# Patient Record
Sex: Female | Born: 1945 | Race: White | Hispanic: No | Marital: Married | Smoking: Former smoker
Health system: Southern US, Community
[De-identification: ages and names within clinical notes are randomized; demographics above are authoritative.]

## PROBLEM LIST (undated history)

## (undated) DIAGNOSIS — F329 Major depressive disorder, single episode, unspecified: Secondary | ICD-10-CM

## (undated) DIAGNOSIS — G20 Parkinson's disease: Secondary | ICD-10-CM

## (undated) DIAGNOSIS — G8929 Other chronic pain: Secondary | ICD-10-CM

## (undated) DIAGNOSIS — I2510 Atherosclerotic heart disease of native coronary artery without angina pectoris: Secondary | ICD-10-CM

## (undated) DIAGNOSIS — I639 Cerebral infarction, unspecified: Secondary | ICD-10-CM

## (undated) DIAGNOSIS — F32A Depression, unspecified: Secondary | ICD-10-CM

## (undated) DIAGNOSIS — G20A1 Parkinson's disease without dyskinesia, without mention of fluctuations: Secondary | ICD-10-CM

---
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
## 2011-03-19 DIAGNOSIS — J209 Acute bronchitis, unspecified: Secondary | ICD-10-CM | POA: Diagnosis not present

## 2011-05-16 DIAGNOSIS — E119 Type 2 diabetes mellitus without complications: Secondary | ICD-10-CM | POA: Diagnosis not present

## 2011-05-16 DIAGNOSIS — I252 Old myocardial infarction: Secondary | ICD-10-CM | POA: Diagnosis not present

## 2011-05-16 DIAGNOSIS — R002 Palpitations: Secondary | ICD-10-CM | POA: Diagnosis not present

## 2011-05-16 DIAGNOSIS — I10 Essential (primary) hypertension: Secondary | ICD-10-CM | POA: Diagnosis not present

## 2011-05-16 DIAGNOSIS — I2510 Atherosclerotic heart disease of native coronary artery without angina pectoris: Secondary | ICD-10-CM | POA: Diagnosis not present

## 2011-05-16 DIAGNOSIS — I209 Angina pectoris, unspecified: Secondary | ICD-10-CM | POA: Diagnosis not present

## 2011-05-23 DIAGNOSIS — I10 Essential (primary) hypertension: Secondary | ICD-10-CM | POA: Diagnosis not present

## 2011-09-02 DIAGNOSIS — H04129 Dry eye syndrome of unspecified lacrimal gland: Secondary | ICD-10-CM | POA: Diagnosis not present

## 2011-09-02 DIAGNOSIS — H1820 Unspecified corneal edema: Secondary | ICD-10-CM | POA: Diagnosis not present

## 2011-09-02 DIAGNOSIS — H26499 Other secondary cataract, unspecified eye: Secondary | ICD-10-CM | POA: Diagnosis not present

## 2011-09-26 DIAGNOSIS — I10 Essential (primary) hypertension: Secondary | ICD-10-CM | POA: Diagnosis not present

## 2011-09-26 DIAGNOSIS — E7800 Pure hypercholesterolemia, unspecified: Secondary | ICD-10-CM | POA: Diagnosis not present

## 2011-09-26 DIAGNOSIS — I259 Chronic ischemic heart disease, unspecified: Secondary | ICD-10-CM | POA: Diagnosis not present

## 2011-10-17 DIAGNOSIS — H04129 Dry eye syndrome of unspecified lacrimal gland: Secondary | ICD-10-CM | POA: Diagnosis not present

## 2011-11-22 DIAGNOSIS — I10 Essential (primary) hypertension: Secondary | ICD-10-CM | POA: Diagnosis not present

## 2011-12-19 DIAGNOSIS — E119 Type 2 diabetes mellitus without complications: Secondary | ICD-10-CM | POA: Diagnosis not present

## 2011-12-19 DIAGNOSIS — E785 Hyperlipidemia, unspecified: Secondary | ICD-10-CM | POA: Diagnosis not present

## 2011-12-19 DIAGNOSIS — I209 Angina pectoris, unspecified: Secondary | ICD-10-CM | POA: Diagnosis not present

## 2011-12-19 DIAGNOSIS — I10 Essential (primary) hypertension: Secondary | ICD-10-CM | POA: Diagnosis not present

## 2011-12-19 DIAGNOSIS — I2510 Atherosclerotic heart disease of native coronary artery without angina pectoris: Secondary | ICD-10-CM | POA: Diagnosis not present

## 2011-12-19 DIAGNOSIS — I219 Acute myocardial infarction, unspecified: Secondary | ICD-10-CM | POA: Diagnosis not present

## 2012-02-21 DIAGNOSIS — Z79899 Other long term (current) drug therapy: Secondary | ICD-10-CM | POA: Diagnosis not present

## 2012-02-21 DIAGNOSIS — I10 Essential (primary) hypertension: Secondary | ICD-10-CM | POA: Diagnosis not present

## 2012-02-21 DIAGNOSIS — E7800 Pure hypercholesterolemia, unspecified: Secondary | ICD-10-CM | POA: Diagnosis not present

## 2012-06-08 DIAGNOSIS — H26499 Other secondary cataract, unspecified eye: Secondary | ICD-10-CM | POA: Diagnosis not present

## 2012-06-12 DIAGNOSIS — H353190 Nonexudative age-related macular degeneration, unspecified eye, stage unspecified: Secondary | ICD-10-CM | POA: Diagnosis not present

## 2012-06-12 DIAGNOSIS — H04129 Dry eye syndrome of unspecified lacrimal gland: Secondary | ICD-10-CM | POA: Diagnosis not present

## 2012-06-12 DIAGNOSIS — H26499 Other secondary cataract, unspecified eye: Secondary | ICD-10-CM | POA: Diagnosis not present

## 2012-06-24 DIAGNOSIS — R109 Unspecified abdominal pain: Secondary | ICD-10-CM | POA: Diagnosis not present

## 2012-06-24 DIAGNOSIS — B029 Zoster without complications: Secondary | ICD-10-CM | POA: Diagnosis not present

## 2012-06-30 DIAGNOSIS — H26499 Other secondary cataract, unspecified eye: Secondary | ICD-10-CM | POA: Diagnosis not present

## 2012-07-03 DIAGNOSIS — H26499 Other secondary cataract, unspecified eye: Secondary | ICD-10-CM | POA: Diagnosis not present

## 2012-07-14 DIAGNOSIS — H26499 Other secondary cataract, unspecified eye: Secondary | ICD-10-CM | POA: Diagnosis not present

## 2012-08-10 DIAGNOSIS — E119 Type 2 diabetes mellitus without complications: Secondary | ICD-10-CM | POA: Diagnosis not present

## 2012-08-10 DIAGNOSIS — E785 Hyperlipidemia, unspecified: Secondary | ICD-10-CM | POA: Diagnosis not present

## 2012-08-10 DIAGNOSIS — I2510 Atherosclerotic heart disease of native coronary artery without angina pectoris: Secondary | ICD-10-CM | POA: Diagnosis not present

## 2012-08-10 DIAGNOSIS — I209 Angina pectoris, unspecified: Secondary | ICD-10-CM | POA: Diagnosis not present

## 2012-08-10 DIAGNOSIS — I10 Essential (primary) hypertension: Secondary | ICD-10-CM | POA: Diagnosis not present

## 2013-01-30 DIAGNOSIS — B029 Zoster without complications: Secondary | ICD-10-CM | POA: Diagnosis not present

## 2013-02-15 DIAGNOSIS — IMO0001 Reserved for inherently not codable concepts without codable children: Secondary | ICD-10-CM | POA: Diagnosis not present

## 2013-02-15 DIAGNOSIS — E7800 Pure hypercholesterolemia, unspecified: Secondary | ICD-10-CM | POA: Diagnosis not present

## 2013-02-15 DIAGNOSIS — I10 Essential (primary) hypertension: Secondary | ICD-10-CM | POA: Diagnosis not present

## 2013-02-15 DIAGNOSIS — Z79899 Other long term (current) drug therapy: Secondary | ICD-10-CM | POA: Diagnosis not present

## 2013-03-16 DIAGNOSIS — L84 Corns and callosities: Secondary | ICD-10-CM | POA: Diagnosis not present

## 2013-03-16 DIAGNOSIS — E119 Type 2 diabetes mellitus without complications: Secondary | ICD-10-CM | POA: Diagnosis not present

## 2013-03-16 DIAGNOSIS — L600 Ingrowing nail: Secondary | ICD-10-CM | POA: Diagnosis not present

## 2013-03-16 DIAGNOSIS — M21619 Bunion of unspecified foot: Secondary | ICD-10-CM | POA: Diagnosis not present

## 2013-03-28 DIAGNOSIS — R1032 Left lower quadrant pain: Secondary | ICD-10-CM | POA: Diagnosis not present

## 2013-03-28 DIAGNOSIS — R110 Nausea: Secondary | ICD-10-CM | POA: Diagnosis not present

## 2013-03-28 DIAGNOSIS — N201 Calculus of ureter: Secondary | ICD-10-CM | POA: Diagnosis not present

## 2013-03-28 DIAGNOSIS — N200 Calculus of kidney: Secondary | ICD-10-CM | POA: Diagnosis not present

## 2013-07-16 DIAGNOSIS — H18519 Endothelial corneal dystrophy, unspecified eye: Secondary | ICD-10-CM | POA: Diagnosis not present

## 2014-02-24 DIAGNOSIS — E1169 Type 2 diabetes mellitus with other specified complication: Secondary | ICD-10-CM | POA: Diagnosis not present

## 2014-02-24 DIAGNOSIS — E780 Pure hypercholesterolemia: Secondary | ICD-10-CM | POA: Diagnosis not present

## 2014-02-24 DIAGNOSIS — E1165 Type 2 diabetes mellitus with hyperglycemia: Secondary | ICD-10-CM | POA: Diagnosis not present

## 2014-02-24 DIAGNOSIS — Z0000 Encounter for general adult medical examination without abnormal findings: Secondary | ICD-10-CM | POA: Diagnosis not present

## 2014-02-24 DIAGNOSIS — I10 Essential (primary) hypertension: Secondary | ICD-10-CM | POA: Diagnosis not present

## 2014-02-24 DIAGNOSIS — Z79899 Other long term (current) drug therapy: Secondary | ICD-10-CM | POA: Diagnosis not present

## 2014-03-01 DIAGNOSIS — Z1211 Encounter for screening for malignant neoplasm of colon: Secondary | ICD-10-CM | POA: Diagnosis not present

## 2014-07-05 DIAGNOSIS — I259 Chronic ischemic heart disease, unspecified: Secondary | ICD-10-CM | POA: Diagnosis not present

## 2014-07-05 DIAGNOSIS — E1169 Type 2 diabetes mellitus with other specified complication: Secondary | ICD-10-CM | POA: Diagnosis not present

## 2014-07-05 DIAGNOSIS — Z79899 Other long term (current) drug therapy: Secondary | ICD-10-CM | POA: Diagnosis not present

## 2014-07-05 DIAGNOSIS — E780 Pure hypercholesterolemia: Secondary | ICD-10-CM | POA: Diagnosis not present

## 2014-07-05 DIAGNOSIS — E1165 Type 2 diabetes mellitus with hyperglycemia: Secondary | ICD-10-CM | POA: Diagnosis not present

## 2014-08-18 DIAGNOSIS — M6281 Muscle weakness (generalized): Secondary | ICD-10-CM | POA: Diagnosis not present

## 2014-08-18 DIAGNOSIS — I10 Essential (primary) hypertension: Secondary | ICD-10-CM | POA: Diagnosis not present

## 2014-08-18 DIAGNOSIS — I2510 Atherosclerotic heart disease of native coronary artery without angina pectoris: Secondary | ICD-10-CM | POA: Diagnosis not present

## 2014-08-18 DIAGNOSIS — E785 Hyperlipidemia, unspecified: Secondary | ICD-10-CM | POA: Diagnosis not present

## 2014-08-23 DIAGNOSIS — R531 Weakness: Secondary | ICD-10-CM | POA: Diagnosis not present

## 2014-08-23 DIAGNOSIS — I6523 Occlusion and stenosis of bilateral carotid arteries: Secondary | ICD-10-CM | POA: Diagnosis not present

## 2014-09-13 DIAGNOSIS — I10 Essential (primary) hypertension: Secondary | ICD-10-CM | POA: Diagnosis not present

## 2014-09-13 DIAGNOSIS — R251 Tremor, unspecified: Secondary | ICD-10-CM | POA: Diagnosis not present

## 2014-09-16 DIAGNOSIS — R251 Tremor, unspecified: Secondary | ICD-10-CM | POA: Diagnosis not present

## 2014-09-16 DIAGNOSIS — R200 Anesthesia of skin: Secondary | ICD-10-CM | POA: Diagnosis not present

## 2014-09-16 DIAGNOSIS — R29898 Other symptoms and signs involving the musculoskeletal system: Secondary | ICD-10-CM | POA: Diagnosis not present

## 2014-09-23 DIAGNOSIS — E119 Type 2 diabetes mellitus without complications: Secondary | ICD-10-CM | POA: Diagnosis not present

## 2014-09-23 DIAGNOSIS — F419 Anxiety disorder, unspecified: Secondary | ICD-10-CM | POA: Diagnosis not present

## 2014-09-23 DIAGNOSIS — I252 Old myocardial infarction: Secondary | ICD-10-CM | POA: Diagnosis not present

## 2014-09-23 DIAGNOSIS — Z87891 Personal history of nicotine dependence: Secondary | ICD-10-CM | POA: Diagnosis not present

## 2014-09-23 DIAGNOSIS — G459 Transient cerebral ischemic attack, unspecified: Secondary | ICD-10-CM | POA: Diagnosis not present

## 2014-09-23 DIAGNOSIS — R531 Weakness: Secondary | ICD-10-CM | POA: Diagnosis not present

## 2014-09-23 DIAGNOSIS — G629 Polyneuropathy, unspecified: Secondary | ICD-10-CM | POA: Diagnosis not present

## 2014-09-23 DIAGNOSIS — R200 Anesthesia of skin: Secondary | ICD-10-CM | POA: Diagnosis not present

## 2014-10-03 DIAGNOSIS — R251 Tremor, unspecified: Secondary | ICD-10-CM | POA: Diagnosis not present

## 2014-10-03 DIAGNOSIS — Z8673 Personal history of transient ischemic attack (TIA), and cerebral infarction without residual deficits: Secondary | ICD-10-CM | POA: Diagnosis not present

## 2014-10-03 DIAGNOSIS — I672 Cerebral atherosclerosis: Secondary | ICD-10-CM | POA: Diagnosis not present

## 2014-10-03 DIAGNOSIS — I10 Essential (primary) hypertension: Secondary | ICD-10-CM | POA: Diagnosis not present

## 2014-10-18 DIAGNOSIS — R251 Tremor, unspecified: Secondary | ICD-10-CM | POA: Diagnosis not present

## 2014-10-20 DIAGNOSIS — F419 Anxiety disorder, unspecified: Secondary | ICD-10-CM | POA: Diagnosis not present

## 2014-10-20 DIAGNOSIS — I10 Essential (primary) hypertension: Secondary | ICD-10-CM | POA: Diagnosis not present

## 2014-10-24 DIAGNOSIS — I639 Cerebral infarction, unspecified: Secondary | ICD-10-CM | POA: Diagnosis not present

## 2014-10-24 DIAGNOSIS — R251 Tremor, unspecified: Secondary | ICD-10-CM | POA: Diagnosis not present

## 2014-10-28 DIAGNOSIS — R251 Tremor, unspecified: Secondary | ICD-10-CM | POA: Diagnosis not present

## 2014-10-28 DIAGNOSIS — I63329 Cerebral infarction due to thrombosis of unspecified anterior cerebral artery: Secondary | ICD-10-CM | POA: Diagnosis not present

## 2014-10-28 DIAGNOSIS — I63422 Cerebral infarction due to embolism of left anterior cerebral artery: Secondary | ICD-10-CM | POA: Diagnosis not present

## 2014-11-01 DIAGNOSIS — I63422 Cerebral infarction due to embolism of left anterior cerebral artery: Secondary | ICD-10-CM | POA: Diagnosis not present

## 2014-11-01 DIAGNOSIS — I63329 Cerebral infarction due to thrombosis of unspecified anterior cerebral artery: Secondary | ICD-10-CM | POA: Diagnosis not present

## 2014-11-04 DIAGNOSIS — I69954 Hemiplegia and hemiparesis following unspecified cerebrovascular disease affecting left non-dominant side: Secondary | ICD-10-CM | POA: Diagnosis not present

## 2014-11-04 DIAGNOSIS — R278 Other lack of coordination: Secondary | ICD-10-CM | POA: Diagnosis not present

## 2014-11-04 DIAGNOSIS — I63329 Cerebral infarction due to thrombosis of unspecified anterior cerebral artery: Secondary | ICD-10-CM | POA: Diagnosis not present

## 2014-11-04 DIAGNOSIS — M6281 Muscle weakness (generalized): Secondary | ICD-10-CM | POA: Diagnosis not present

## 2014-11-09 DIAGNOSIS — M6281 Muscle weakness (generalized): Secondary | ICD-10-CM | POA: Diagnosis not present

## 2014-11-09 DIAGNOSIS — R278 Other lack of coordination: Secondary | ICD-10-CM | POA: Diagnosis not present

## 2014-11-09 DIAGNOSIS — I69954 Hemiplegia and hemiparesis following unspecified cerebrovascular disease affecting left non-dominant side: Secondary | ICD-10-CM | POA: Diagnosis not present

## 2014-11-09 DIAGNOSIS — I63329 Cerebral infarction due to thrombosis of unspecified anterior cerebral artery: Secondary | ICD-10-CM | POA: Diagnosis not present

## 2014-11-11 DIAGNOSIS — M6281 Muscle weakness (generalized): Secondary | ICD-10-CM | POA: Diagnosis not present

## 2014-11-11 DIAGNOSIS — I69954 Hemiplegia and hemiparesis following unspecified cerebrovascular disease affecting left non-dominant side: Secondary | ICD-10-CM | POA: Diagnosis not present

## 2014-11-11 DIAGNOSIS — R278 Other lack of coordination: Secondary | ICD-10-CM | POA: Diagnosis not present

## 2014-11-11 DIAGNOSIS — I63329 Cerebral infarction due to thrombosis of unspecified anterior cerebral artery: Secondary | ICD-10-CM | POA: Diagnosis not present

## 2014-11-16 DIAGNOSIS — R278 Other lack of coordination: Secondary | ICD-10-CM | POA: Diagnosis not present

## 2014-11-16 DIAGNOSIS — R2689 Other abnormalities of gait and mobility: Secondary | ICD-10-CM | POA: Diagnosis not present

## 2014-11-16 DIAGNOSIS — I69954 Hemiplegia and hemiparesis following unspecified cerebrovascular disease affecting left non-dominant side: Secondary | ICD-10-CM | POA: Diagnosis not present

## 2014-11-16 DIAGNOSIS — R296 Repeated falls: Secondary | ICD-10-CM | POA: Diagnosis not present

## 2014-11-16 DIAGNOSIS — I63329 Cerebral infarction due to thrombosis of unspecified anterior cerebral artery: Secondary | ICD-10-CM | POA: Diagnosis not present

## 2014-11-16 DIAGNOSIS — M6281 Muscle weakness (generalized): Secondary | ICD-10-CM | POA: Diagnosis not present

## 2014-11-17 DIAGNOSIS — F419 Anxiety disorder, unspecified: Secondary | ICD-10-CM | POA: Diagnosis not present

## 2014-11-17 DIAGNOSIS — I10 Essential (primary) hypertension: Secondary | ICD-10-CM | POA: Diagnosis not present

## 2014-11-18 DIAGNOSIS — R296 Repeated falls: Secondary | ICD-10-CM | POA: Diagnosis not present

## 2014-11-18 DIAGNOSIS — M6281 Muscle weakness (generalized): Secondary | ICD-10-CM | POA: Diagnosis not present

## 2014-11-18 DIAGNOSIS — I63329 Cerebral infarction due to thrombosis of unspecified anterior cerebral artery: Secondary | ICD-10-CM | POA: Diagnosis not present

## 2014-11-18 DIAGNOSIS — I69954 Hemiplegia and hemiparesis following unspecified cerebrovascular disease affecting left non-dominant side: Secondary | ICD-10-CM | POA: Diagnosis not present

## 2014-11-18 DIAGNOSIS — R2689 Other abnormalities of gait and mobility: Secondary | ICD-10-CM | POA: Diagnosis not present

## 2014-11-18 DIAGNOSIS — R278 Other lack of coordination: Secondary | ICD-10-CM | POA: Diagnosis not present

## 2014-11-23 DIAGNOSIS — R2689 Other abnormalities of gait and mobility: Secondary | ICD-10-CM | POA: Diagnosis not present

## 2014-11-23 DIAGNOSIS — R278 Other lack of coordination: Secondary | ICD-10-CM | POA: Diagnosis not present

## 2014-11-23 DIAGNOSIS — I63329 Cerebral infarction due to thrombosis of unspecified anterior cerebral artery: Secondary | ICD-10-CM | POA: Diagnosis not present

## 2014-11-23 DIAGNOSIS — R251 Tremor, unspecified: Secondary | ICD-10-CM | POA: Diagnosis not present

## 2014-11-23 DIAGNOSIS — R296 Repeated falls: Secondary | ICD-10-CM | POA: Diagnosis not present

## 2014-11-23 DIAGNOSIS — I69954 Hemiplegia and hemiparesis following unspecified cerebrovascular disease affecting left non-dominant side: Secondary | ICD-10-CM | POA: Diagnosis not present

## 2014-11-23 DIAGNOSIS — I63321 Cerebral infarction due to thrombosis of right anterior cerebral artery: Secondary | ICD-10-CM | POA: Diagnosis not present

## 2014-11-23 DIAGNOSIS — M6281 Muscle weakness (generalized): Secondary | ICD-10-CM | POA: Diagnosis not present

## 2014-11-25 DIAGNOSIS — I69954 Hemiplegia and hemiparesis following unspecified cerebrovascular disease affecting left non-dominant side: Secondary | ICD-10-CM | POA: Diagnosis not present

## 2014-11-25 DIAGNOSIS — M6281 Muscle weakness (generalized): Secondary | ICD-10-CM | POA: Diagnosis not present

## 2014-11-25 DIAGNOSIS — R2689 Other abnormalities of gait and mobility: Secondary | ICD-10-CM | POA: Diagnosis not present

## 2014-11-25 DIAGNOSIS — R296 Repeated falls: Secondary | ICD-10-CM | POA: Diagnosis not present

## 2014-11-25 DIAGNOSIS — I63329 Cerebral infarction due to thrombosis of unspecified anterior cerebral artery: Secondary | ICD-10-CM | POA: Diagnosis not present

## 2014-11-25 DIAGNOSIS — R278 Other lack of coordination: Secondary | ICD-10-CM | POA: Diagnosis not present

## 2014-11-28 DIAGNOSIS — R296 Repeated falls: Secondary | ICD-10-CM | POA: Diagnosis not present

## 2014-11-28 DIAGNOSIS — R278 Other lack of coordination: Secondary | ICD-10-CM | POA: Diagnosis not present

## 2014-11-28 DIAGNOSIS — I69954 Hemiplegia and hemiparesis following unspecified cerebrovascular disease affecting left non-dominant side: Secondary | ICD-10-CM | POA: Diagnosis not present

## 2014-11-28 DIAGNOSIS — M6281 Muscle weakness (generalized): Secondary | ICD-10-CM | POA: Diagnosis not present

## 2014-11-28 DIAGNOSIS — R2689 Other abnormalities of gait and mobility: Secondary | ICD-10-CM | POA: Diagnosis not present

## 2014-11-28 DIAGNOSIS — I63329 Cerebral infarction due to thrombosis of unspecified anterior cerebral artery: Secondary | ICD-10-CM | POA: Diagnosis not present

## 2014-11-30 DIAGNOSIS — R2689 Other abnormalities of gait and mobility: Secondary | ICD-10-CM | POA: Diagnosis not present

## 2014-11-30 DIAGNOSIS — R296 Repeated falls: Secondary | ICD-10-CM | POA: Diagnosis not present

## 2014-11-30 DIAGNOSIS — R278 Other lack of coordination: Secondary | ICD-10-CM | POA: Diagnosis not present

## 2014-11-30 DIAGNOSIS — I69954 Hemiplegia and hemiparesis following unspecified cerebrovascular disease affecting left non-dominant side: Secondary | ICD-10-CM | POA: Diagnosis not present

## 2014-11-30 DIAGNOSIS — M6281 Muscle weakness (generalized): Secondary | ICD-10-CM | POA: Diagnosis not present

## 2014-11-30 DIAGNOSIS — I63329 Cerebral infarction due to thrombosis of unspecified anterior cerebral artery: Secondary | ICD-10-CM | POA: Diagnosis not present

## 2014-12-05 DIAGNOSIS — I63329 Cerebral infarction due to thrombosis of unspecified anterior cerebral artery: Secondary | ICD-10-CM | POA: Diagnosis not present

## 2014-12-05 DIAGNOSIS — I69954 Hemiplegia and hemiparesis following unspecified cerebrovascular disease affecting left non-dominant side: Secondary | ICD-10-CM | POA: Diagnosis not present

## 2014-12-05 DIAGNOSIS — M6281 Muscle weakness (generalized): Secondary | ICD-10-CM | POA: Diagnosis not present

## 2014-12-05 DIAGNOSIS — R296 Repeated falls: Secondary | ICD-10-CM | POA: Diagnosis not present

## 2014-12-05 DIAGNOSIS — R2689 Other abnormalities of gait and mobility: Secondary | ICD-10-CM | POA: Diagnosis not present

## 2014-12-05 DIAGNOSIS — R278 Other lack of coordination: Secondary | ICD-10-CM | POA: Diagnosis not present

## 2014-12-07 DIAGNOSIS — I63329 Cerebral infarction due to thrombosis of unspecified anterior cerebral artery: Secondary | ICD-10-CM | POA: Diagnosis not present

## 2014-12-07 DIAGNOSIS — R2689 Other abnormalities of gait and mobility: Secondary | ICD-10-CM | POA: Diagnosis not present

## 2014-12-07 DIAGNOSIS — I69954 Hemiplegia and hemiparesis following unspecified cerebrovascular disease affecting left non-dominant side: Secondary | ICD-10-CM | POA: Diagnosis not present

## 2014-12-07 DIAGNOSIS — M6281 Muscle weakness (generalized): Secondary | ICD-10-CM | POA: Diagnosis not present

## 2014-12-07 DIAGNOSIS — R278 Other lack of coordination: Secondary | ICD-10-CM | POA: Diagnosis not present

## 2014-12-07 DIAGNOSIS — R296 Repeated falls: Secondary | ICD-10-CM | POA: Diagnosis not present

## 2014-12-12 DIAGNOSIS — R296 Repeated falls: Secondary | ICD-10-CM | POA: Diagnosis not present

## 2014-12-12 DIAGNOSIS — R278 Other lack of coordination: Secondary | ICD-10-CM | POA: Diagnosis not present

## 2014-12-12 DIAGNOSIS — I63329 Cerebral infarction due to thrombosis of unspecified anterior cerebral artery: Secondary | ICD-10-CM | POA: Diagnosis not present

## 2014-12-12 DIAGNOSIS — M6281 Muscle weakness (generalized): Secondary | ICD-10-CM | POA: Diagnosis not present

## 2014-12-12 DIAGNOSIS — I69954 Hemiplegia and hemiparesis following unspecified cerebrovascular disease affecting left non-dominant side: Secondary | ICD-10-CM | POA: Diagnosis not present

## 2014-12-12 DIAGNOSIS — R2689 Other abnormalities of gait and mobility: Secondary | ICD-10-CM | POA: Diagnosis not present

## 2014-12-14 DIAGNOSIS — I63329 Cerebral infarction due to thrombosis of unspecified anterior cerebral artery: Secondary | ICD-10-CM | POA: Diagnosis not present

## 2014-12-14 DIAGNOSIS — M6281 Muscle weakness (generalized): Secondary | ICD-10-CM | POA: Diagnosis not present

## 2014-12-14 DIAGNOSIS — R278 Other lack of coordination: Secondary | ICD-10-CM | POA: Diagnosis not present

## 2014-12-14 DIAGNOSIS — I69954 Hemiplegia and hemiparesis following unspecified cerebrovascular disease affecting left non-dominant side: Secondary | ICD-10-CM | POA: Diagnosis not present

## 2014-12-19 DIAGNOSIS — R278 Other lack of coordination: Secondary | ICD-10-CM | POA: Diagnosis not present

## 2014-12-19 DIAGNOSIS — I69954 Hemiplegia and hemiparesis following unspecified cerebrovascular disease affecting left non-dominant side: Secondary | ICD-10-CM | POA: Diagnosis not present

## 2014-12-19 DIAGNOSIS — I63329 Cerebral infarction due to thrombosis of unspecified anterior cerebral artery: Secondary | ICD-10-CM | POA: Diagnosis not present

## 2014-12-19 DIAGNOSIS — M6281 Muscle weakness (generalized): Secondary | ICD-10-CM | POA: Diagnosis not present

## 2014-12-21 DIAGNOSIS — R278 Other lack of coordination: Secondary | ICD-10-CM | POA: Diagnosis not present

## 2014-12-21 DIAGNOSIS — M6281 Muscle weakness (generalized): Secondary | ICD-10-CM | POA: Diagnosis not present

## 2014-12-21 DIAGNOSIS — I69954 Hemiplegia and hemiparesis following unspecified cerebrovascular disease affecting left non-dominant side: Secondary | ICD-10-CM | POA: Diagnosis not present

## 2014-12-21 DIAGNOSIS — I63329 Cerebral infarction due to thrombosis of unspecified anterior cerebral artery: Secondary | ICD-10-CM | POA: Diagnosis not present

## 2014-12-26 DIAGNOSIS — M6281 Muscle weakness (generalized): Secondary | ICD-10-CM | POA: Diagnosis not present

## 2014-12-26 DIAGNOSIS — I69954 Hemiplegia and hemiparesis following unspecified cerebrovascular disease affecting left non-dominant side: Secondary | ICD-10-CM | POA: Diagnosis not present

## 2014-12-26 DIAGNOSIS — R278 Other lack of coordination: Secondary | ICD-10-CM | POA: Diagnosis not present

## 2014-12-26 DIAGNOSIS — I63329 Cerebral infarction due to thrombosis of unspecified anterior cerebral artery: Secondary | ICD-10-CM | POA: Diagnosis not present

## 2014-12-28 DIAGNOSIS — I63329 Cerebral infarction due to thrombosis of unspecified anterior cerebral artery: Secondary | ICD-10-CM | POA: Diagnosis not present

## 2014-12-28 DIAGNOSIS — R278 Other lack of coordination: Secondary | ICD-10-CM | POA: Diagnosis not present

## 2014-12-28 DIAGNOSIS — M6281 Muscle weakness (generalized): Secondary | ICD-10-CM | POA: Diagnosis not present

## 2014-12-28 DIAGNOSIS — I69954 Hemiplegia and hemiparesis following unspecified cerebrovascular disease affecting left non-dominant side: Secondary | ICD-10-CM | POA: Diagnosis not present

## 2015-01-02 DIAGNOSIS — I63329 Cerebral infarction due to thrombosis of unspecified anterior cerebral artery: Secondary | ICD-10-CM | POA: Diagnosis not present

## 2015-01-02 DIAGNOSIS — R278 Other lack of coordination: Secondary | ICD-10-CM | POA: Diagnosis not present

## 2015-01-02 DIAGNOSIS — I69954 Hemiplegia and hemiparesis following unspecified cerebrovascular disease affecting left non-dominant side: Secondary | ICD-10-CM | POA: Diagnosis not present

## 2015-01-02 DIAGNOSIS — M6281 Muscle weakness (generalized): Secondary | ICD-10-CM | POA: Diagnosis not present

## 2015-01-04 DIAGNOSIS — I69954 Hemiplegia and hemiparesis following unspecified cerebrovascular disease affecting left non-dominant side: Secondary | ICD-10-CM | POA: Diagnosis not present

## 2015-01-04 DIAGNOSIS — I63329 Cerebral infarction due to thrombosis of unspecified anterior cerebral artery: Secondary | ICD-10-CM | POA: Diagnosis not present

## 2015-01-04 DIAGNOSIS — M6281 Muscle weakness (generalized): Secondary | ICD-10-CM | POA: Diagnosis not present

## 2015-01-04 DIAGNOSIS — R278 Other lack of coordination: Secondary | ICD-10-CM | POA: Diagnosis not present

## 2015-01-09 DIAGNOSIS — I69954 Hemiplegia and hemiparesis following unspecified cerebrovascular disease affecting left non-dominant side: Secondary | ICD-10-CM | POA: Diagnosis not present

## 2015-01-09 DIAGNOSIS — M6281 Muscle weakness (generalized): Secondary | ICD-10-CM | POA: Diagnosis not present

## 2015-01-09 DIAGNOSIS — I63329 Cerebral infarction due to thrombosis of unspecified anterior cerebral artery: Secondary | ICD-10-CM | POA: Diagnosis not present

## 2015-01-09 DIAGNOSIS — R278 Other lack of coordination: Secondary | ICD-10-CM | POA: Diagnosis not present

## 2015-01-16 DIAGNOSIS — E1169 Type 2 diabetes mellitus with other specified complication: Secondary | ICD-10-CM | POA: Diagnosis not present

## 2015-01-16 DIAGNOSIS — Z79899 Other long term (current) drug therapy: Secondary | ICD-10-CM | POA: Diagnosis not present

## 2015-01-16 DIAGNOSIS — I10 Essential (primary) hypertension: Secondary | ICD-10-CM | POA: Diagnosis not present

## 2015-01-16 DIAGNOSIS — E785 Hyperlipidemia, unspecified: Secondary | ICD-10-CM | POA: Diagnosis not present

## 2015-01-16 DIAGNOSIS — E1165 Type 2 diabetes mellitus with hyperglycemia: Secondary | ICD-10-CM | POA: Diagnosis not present

## 2015-03-08 DIAGNOSIS — R251 Tremor, unspecified: Secondary | ICD-10-CM | POA: Diagnosis not present

## 2015-03-08 DIAGNOSIS — I63321 Cerebral infarction due to thrombosis of right anterior cerebral artery: Secondary | ICD-10-CM | POA: Diagnosis not present

## 2015-06-07 DIAGNOSIS — R251 Tremor, unspecified: Secondary | ICD-10-CM | POA: Diagnosis not present

## 2015-06-12 DIAGNOSIS — R0602 Shortness of breath: Secondary | ICD-10-CM | POA: Diagnosis not present

## 2015-06-12 DIAGNOSIS — E7800 Pure hypercholesterolemia, unspecified: Secondary | ICD-10-CM | POA: Diagnosis not present

## 2015-06-12 DIAGNOSIS — E785 Hyperlipidemia, unspecified: Secondary | ICD-10-CM | POA: Diagnosis not present

## 2015-06-12 DIAGNOSIS — Z79899 Other long term (current) drug therapy: Secondary | ICD-10-CM | POA: Diagnosis not present

## 2015-06-12 DIAGNOSIS — D519 Vitamin B12 deficiency anemia, unspecified: Secondary | ICD-10-CM | POA: Diagnosis not present

## 2015-06-12 DIAGNOSIS — I10 Essential (primary) hypertension: Secondary | ICD-10-CM | POA: Diagnosis not present

## 2015-06-12 DIAGNOSIS — E1169 Type 2 diabetes mellitus with other specified complication: Secondary | ICD-10-CM | POA: Diagnosis not present

## 2015-06-12 DIAGNOSIS — E559 Vitamin D deficiency, unspecified: Secondary | ICD-10-CM | POA: Diagnosis not present

## 2015-06-12 DIAGNOSIS — I2510 Atherosclerotic heart disease of native coronary artery without angina pectoris: Secondary | ICD-10-CM | POA: Diagnosis not present

## 2015-06-26 DIAGNOSIS — I10 Essential (primary) hypertension: Secondary | ICD-10-CM | POA: Diagnosis not present

## 2015-06-26 DIAGNOSIS — I2510 Atherosclerotic heart disease of native coronary artery without angina pectoris: Secondary | ICD-10-CM | POA: Diagnosis not present

## 2015-06-26 DIAGNOSIS — E785 Hyperlipidemia, unspecified: Secondary | ICD-10-CM | POA: Diagnosis not present

## 2015-07-07 DIAGNOSIS — E7800 Pure hypercholesterolemia, unspecified: Secondary | ICD-10-CM | POA: Diagnosis not present

## 2015-07-07 DIAGNOSIS — G250 Essential tremor: Secondary | ICD-10-CM | POA: Diagnosis not present

## 2015-07-07 DIAGNOSIS — I10 Essential (primary) hypertension: Secondary | ICD-10-CM | POA: Diagnosis not present

## 2015-07-07 DIAGNOSIS — E1169 Type 2 diabetes mellitus with other specified complication: Secondary | ICD-10-CM | POA: Diagnosis not present

## 2015-07-31 DIAGNOSIS — F411 Generalized anxiety disorder: Secondary | ICD-10-CM | POA: Diagnosis not present

## 2015-08-08 DIAGNOSIS — F411 Generalized anxiety disorder: Secondary | ICD-10-CM | POA: Diagnosis not present

## 2015-08-17 DIAGNOSIS — F411 Generalized anxiety disorder: Secondary | ICD-10-CM | POA: Diagnosis not present

## 2015-08-23 DIAGNOSIS — F411 Generalized anxiety disorder: Secondary | ICD-10-CM | POA: Diagnosis not present

## 2015-08-30 DIAGNOSIS — F411 Generalized anxiety disorder: Secondary | ICD-10-CM | POA: Diagnosis not present

## 2015-09-04 DIAGNOSIS — R0602 Shortness of breath: Secondary | ICD-10-CM | POA: Diagnosis not present

## 2015-09-04 DIAGNOSIS — R0600 Dyspnea, unspecified: Secondary | ICD-10-CM | POA: Diagnosis not present

## 2015-09-04 DIAGNOSIS — J40 Bronchitis, not specified as acute or chronic: Secondary | ICD-10-CM | POA: Diagnosis not present

## 2015-09-06 DIAGNOSIS — R05 Cough: Secondary | ICD-10-CM | POA: Diagnosis not present

## 2015-09-06 DIAGNOSIS — R0600 Dyspnea, unspecified: Secondary | ICD-10-CM | POA: Diagnosis not present

## 2015-09-06 DIAGNOSIS — R0602 Shortness of breath: Secondary | ICD-10-CM | POA: Diagnosis not present

## 2015-09-06 DIAGNOSIS — J40 Bronchitis, not specified as acute or chronic: Secondary | ICD-10-CM | POA: Diagnosis not present

## 2015-09-20 DIAGNOSIS — F411 Generalized anxiety disorder: Secondary | ICD-10-CM | POA: Diagnosis not present

## 2015-09-28 DIAGNOSIS — F411 Generalized anxiety disorder: Secondary | ICD-10-CM | POA: Diagnosis not present

## 2015-10-04 DIAGNOSIS — R251 Tremor, unspecified: Secondary | ICD-10-CM | POA: Diagnosis not present

## 2015-10-06 DIAGNOSIS — F411 Generalized anxiety disorder: Secondary | ICD-10-CM | POA: Diagnosis not present

## 2015-10-13 DIAGNOSIS — F411 Generalized anxiety disorder: Secondary | ICD-10-CM | POA: Diagnosis not present

## 2015-10-18 DIAGNOSIS — E1169 Type 2 diabetes mellitus with other specified complication: Secondary | ICD-10-CM | POA: Diagnosis not present

## 2015-10-18 DIAGNOSIS — Z79899 Other long term (current) drug therapy: Secondary | ICD-10-CM | POA: Diagnosis not present

## 2015-10-18 DIAGNOSIS — E559 Vitamin D deficiency, unspecified: Secondary | ICD-10-CM | POA: Diagnosis not present

## 2015-10-18 DIAGNOSIS — E785 Hyperlipidemia, unspecified: Secondary | ICD-10-CM | POA: Diagnosis not present

## 2015-10-18 DIAGNOSIS — I10 Essential (primary) hypertension: Secondary | ICD-10-CM | POA: Diagnosis not present

## 2015-10-19 DIAGNOSIS — M79661 Pain in right lower leg: Secondary | ICD-10-CM | POA: Diagnosis not present

## 2015-10-19 DIAGNOSIS — M7989 Other specified soft tissue disorders: Secondary | ICD-10-CM | POA: Diagnosis not present

## 2015-10-19 DIAGNOSIS — I824Y1 Acute embolism and thrombosis of unspecified deep veins of right proximal lower extremity: Secondary | ICD-10-CM | POA: Diagnosis not present

## 2015-10-20 DIAGNOSIS — F411 Generalized anxiety disorder: Secondary | ICD-10-CM | POA: Diagnosis not present

## 2015-10-26 DIAGNOSIS — F411 Generalized anxiety disorder: Secondary | ICD-10-CM | POA: Diagnosis not present

## 2015-11-01 DIAGNOSIS — F411 Generalized anxiety disorder: Secondary | ICD-10-CM | POA: Diagnosis not present

## 2015-11-06 DIAGNOSIS — R251 Tremor, unspecified: Secondary | ICD-10-CM | POA: Diagnosis not present

## 2015-11-08 DIAGNOSIS — F411 Generalized anxiety disorder: Secondary | ICD-10-CM | POA: Diagnosis not present

## 2015-11-15 DIAGNOSIS — F411 Generalized anxiety disorder: Secondary | ICD-10-CM | POA: Diagnosis not present

## 2015-11-16 DIAGNOSIS — Z23 Encounter for immunization: Secondary | ICD-10-CM | POA: Diagnosis not present

## 2015-11-22 DIAGNOSIS — F411 Generalized anxiety disorder: Secondary | ICD-10-CM | POA: Diagnosis not present

## 2015-11-29 DIAGNOSIS — F411 Generalized anxiety disorder: Secondary | ICD-10-CM | POA: Diagnosis not present

## 2015-12-06 DIAGNOSIS — F411 Generalized anxiety disorder: Secondary | ICD-10-CM | POA: Diagnosis not present

## 2015-12-13 DIAGNOSIS — F411 Generalized anxiety disorder: Secondary | ICD-10-CM | POA: Diagnosis not present

## 2015-12-20 DIAGNOSIS — F411 Generalized anxiety disorder: Secondary | ICD-10-CM | POA: Diagnosis not present

## 2015-12-27 DIAGNOSIS — F411 Generalized anxiety disorder: Secondary | ICD-10-CM | POA: Diagnosis not present

## 2015-12-29 DIAGNOSIS — R251 Tremor, unspecified: Secondary | ICD-10-CM | POA: Diagnosis not present

## 2016-01-03 DIAGNOSIS — F411 Generalized anxiety disorder: Secondary | ICD-10-CM | POA: Diagnosis not present

## 2016-01-10 DIAGNOSIS — F411 Generalized anxiety disorder: Secondary | ICD-10-CM | POA: Diagnosis not present

## 2016-01-11 DIAGNOSIS — F411 Generalized anxiety disorder: Secondary | ICD-10-CM | POA: Diagnosis not present

## 2016-01-17 DIAGNOSIS — F411 Generalized anxiety disorder: Secondary | ICD-10-CM | POA: Diagnosis not present

## 2016-01-20 DIAGNOSIS — I63232 Cerebral infarction due to unspecified occlusion or stenosis of left carotid arteries: Secondary | ICD-10-CM | POA: Diagnosis not present

## 2016-01-20 DIAGNOSIS — I252 Old myocardial infarction: Secondary | ICD-10-CM | POA: Diagnosis not present

## 2016-01-20 DIAGNOSIS — R278 Other lack of coordination: Secondary | ICD-10-CM | POA: Diagnosis not present

## 2016-01-20 DIAGNOSIS — I639 Cerebral infarction, unspecified: Secondary | ICD-10-CM | POA: Diagnosis not present

## 2016-01-20 DIAGNOSIS — Z8673 Personal history of transient ischemic attack (TIA), and cerebral infarction without residual deficits: Secondary | ICD-10-CM | POA: Diagnosis not present

## 2016-01-20 DIAGNOSIS — I69354 Hemiplegia and hemiparesis following cerebral infarction affecting left non-dominant side: Secondary | ICD-10-CM | POA: Diagnosis not present

## 2016-01-20 DIAGNOSIS — R4182 Altered mental status, unspecified: Secondary | ICD-10-CM | POA: Diagnosis not present

## 2016-01-20 DIAGNOSIS — I69022 Dysarthria following nontraumatic subarachnoid hemorrhage: Secondary | ICD-10-CM | POA: Diagnosis not present

## 2016-01-20 DIAGNOSIS — I361 Nonrheumatic tricuspid (valve) insufficiency: Secondary | ICD-10-CM | POA: Diagnosis not present

## 2016-01-20 DIAGNOSIS — Z888 Allergy status to other drugs, medicaments and biological substances status: Secondary | ICD-10-CM | POA: Diagnosis not present

## 2016-01-20 DIAGNOSIS — Z79899 Other long term (current) drug therapy: Secondary | ICD-10-CM | POA: Diagnosis not present

## 2016-01-20 DIAGNOSIS — I6522 Occlusion and stenosis of left carotid artery: Secondary | ICD-10-CM | POA: Diagnosis not present

## 2016-01-20 DIAGNOSIS — R0682 Tachypnea, not elsewhere classified: Secondary | ICD-10-CM | POA: Diagnosis present

## 2016-01-20 DIAGNOSIS — I2541 Coronary artery aneurysm: Secondary | ICD-10-CM | POA: Diagnosis not present

## 2016-01-20 DIAGNOSIS — S72001A Fracture of unspecified part of neck of right femur, initial encounter for closed fracture: Secondary | ICD-10-CM | POA: Diagnosis not present

## 2016-01-20 DIAGNOSIS — R293 Abnormal posture: Secondary | ICD-10-CM | POA: Diagnosis not present

## 2016-01-20 DIAGNOSIS — Z87891 Personal history of nicotine dependence: Secondary | ICD-10-CM | POA: Diagnosis not present

## 2016-01-20 DIAGNOSIS — S72012A Unspecified intracapsular fracture of left femur, initial encounter for closed fracture: Secondary | ICD-10-CM | POA: Diagnosis not present

## 2016-01-20 DIAGNOSIS — M6281 Muscle weakness (generalized): Secondary | ICD-10-CM | POA: Diagnosis not present

## 2016-01-20 DIAGNOSIS — S299XXA Unspecified injury of thorax, initial encounter: Secondary | ICD-10-CM | POA: Diagnosis not present

## 2016-01-20 DIAGNOSIS — R2689 Other abnormalities of gait and mobility: Secondary | ICD-10-CM | POA: Diagnosis not present

## 2016-01-20 DIAGNOSIS — F418 Other specified anxiety disorders: Secondary | ICD-10-CM | POA: Diagnosis present

## 2016-01-20 DIAGNOSIS — T148XXA Other injury of unspecified body region, initial encounter: Secondary | ICD-10-CM | POA: Diagnosis not present

## 2016-01-20 DIAGNOSIS — Z9181 History of falling: Secondary | ICD-10-CM | POA: Diagnosis not present

## 2016-01-20 DIAGNOSIS — R29715 NIHSS score 15: Secondary | ICD-10-CM | POA: Diagnosis present

## 2016-01-20 DIAGNOSIS — G8191 Hemiplegia, unspecified affecting right dominant side: Secondary | ICD-10-CM | POA: Diagnosis present

## 2016-01-20 DIAGNOSIS — S72052A Unspecified fracture of head of left femur, initial encounter for closed fracture: Secondary | ICD-10-CM | POA: Diagnosis not present

## 2016-01-20 DIAGNOSIS — R29810 Facial weakness: Secondary | ICD-10-CM | POA: Diagnosis not present

## 2016-01-20 DIAGNOSIS — S72012D Unspecified intracapsular fracture of left femur, subsequent encounter for closed fracture with routine healing: Secondary | ICD-10-CM | POA: Diagnosis not present

## 2016-01-20 DIAGNOSIS — S0990XA Unspecified injury of head, initial encounter: Secondary | ICD-10-CM | POA: Diagnosis not present

## 2016-01-20 DIAGNOSIS — M25552 Pain in left hip: Secondary | ICD-10-CM | POA: Diagnosis not present

## 2016-01-20 DIAGNOSIS — Z7982 Long term (current) use of aspirin: Secondary | ICD-10-CM | POA: Diagnosis not present

## 2016-01-20 DIAGNOSIS — S72042A Displaced fracture of base of neck of left femur, initial encounter for closed fracture: Secondary | ICD-10-CM | POA: Diagnosis not present

## 2016-01-20 DIAGNOSIS — Z01810 Encounter for preprocedural cardiovascular examination: Secondary | ICD-10-CM | POA: Diagnosis not present

## 2016-01-20 DIAGNOSIS — S72002A Fracture of unspecified part of neck of left femur, initial encounter for closed fracture: Secondary | ICD-10-CM | POA: Diagnosis not present

## 2016-01-20 DIAGNOSIS — G20 Parkinson's disease: Secondary | ICD-10-CM | POA: Diagnosis not present

## 2016-01-20 DIAGNOSIS — R1312 Dysphagia, oropharyngeal phase: Secondary | ICD-10-CM | POA: Diagnosis not present

## 2016-01-20 DIAGNOSIS — R5383 Other fatigue: Secondary | ICD-10-CM | POA: Diagnosis not present

## 2016-01-20 DIAGNOSIS — I2510 Atherosclerotic heart disease of native coronary artery without angina pectoris: Secondary | ICD-10-CM | POA: Diagnosis present

## 2016-01-20 DIAGNOSIS — I6602 Occlusion and stenosis of left middle cerebral artery: Secondary | ICD-10-CM | POA: Diagnosis not present

## 2016-01-23 DIAGNOSIS — I679 Cerebrovascular disease, unspecified: Secondary | ICD-10-CM | POA: Diagnosis not present

## 2016-01-23 DIAGNOSIS — R400 Somnolence: Secondary | ICD-10-CM | POA: Diagnosis not present

## 2016-01-23 DIAGNOSIS — I2541 Coronary artery aneurysm: Secondary | ICD-10-CM | POA: Diagnosis not present

## 2016-01-23 DIAGNOSIS — F321 Major depressive disorder, single episode, moderate: Secondary | ICD-10-CM | POA: Diagnosis not present

## 2016-01-23 DIAGNOSIS — R278 Other lack of coordination: Secondary | ICD-10-CM | POA: Diagnosis not present

## 2016-01-23 DIAGNOSIS — K210 Gastro-esophageal reflux disease with esophagitis: Secondary | ICD-10-CM | POA: Diagnosis not present

## 2016-01-23 DIAGNOSIS — R1312 Dysphagia, oropharyngeal phase: Secondary | ICD-10-CM | POA: Diagnosis not present

## 2016-01-23 DIAGNOSIS — E118 Type 2 diabetes mellitus with unspecified complications: Secondary | ICD-10-CM | POA: Diagnosis not present

## 2016-01-23 DIAGNOSIS — G8110 Spastic hemiplegia affecting unspecified side: Secondary | ICD-10-CM | POA: Diagnosis not present

## 2016-01-23 DIAGNOSIS — S71009A Unspecified open wound, unspecified hip, initial encounter: Secondary | ICD-10-CM | POA: Diagnosis not present

## 2016-01-23 DIAGNOSIS — I63139 Cerebral infarction due to embolism of unspecified carotid artery: Secondary | ICD-10-CM | POA: Diagnosis not present

## 2016-01-23 DIAGNOSIS — R2689 Other abnormalities of gait and mobility: Secondary | ICD-10-CM | POA: Diagnosis not present

## 2016-01-23 DIAGNOSIS — S72012A Unspecified intracapsular fracture of left femur, initial encounter for closed fracture: Secondary | ICD-10-CM | POA: Diagnosis not present

## 2016-01-23 DIAGNOSIS — I63511 Cerebral infarction due to unspecified occlusion or stenosis of right middle cerebral artery: Secondary | ICD-10-CM | POA: Diagnosis not present

## 2016-01-23 DIAGNOSIS — F323 Major depressive disorder, single episode, severe with psychotic features: Secondary | ICD-10-CM | POA: Diagnosis not present

## 2016-01-23 DIAGNOSIS — Z9181 History of falling: Secondary | ICD-10-CM | POA: Diagnosis not present

## 2016-01-23 DIAGNOSIS — S72012D Unspecified intracapsular fracture of left femur, subsequent encounter for closed fracture with routine healing: Secondary | ICD-10-CM | POA: Diagnosis not present

## 2016-01-23 DIAGNOSIS — I498 Other specified cardiac arrhythmias: Secondary | ICD-10-CM | POA: Diagnosis not present

## 2016-01-23 DIAGNOSIS — Z79899 Other long term (current) drug therapy: Secondary | ICD-10-CM | POA: Diagnosis not present

## 2016-01-23 DIAGNOSIS — R5383 Other fatigue: Secondary | ICD-10-CM | POA: Diagnosis not present

## 2016-01-23 DIAGNOSIS — M62838 Other muscle spasm: Secondary | ICD-10-CM | POA: Diagnosis not present

## 2016-01-23 DIAGNOSIS — G4700 Insomnia, unspecified: Secondary | ICD-10-CM | POA: Diagnosis not present

## 2016-01-23 DIAGNOSIS — I69354 Hemiplegia and hemiparesis following cerebral infarction affecting left non-dominant side: Secondary | ICD-10-CM | POA: Diagnosis not present

## 2016-01-23 DIAGNOSIS — E785 Hyperlipidemia, unspecified: Secondary | ICD-10-CM | POA: Diagnosis not present

## 2016-01-23 DIAGNOSIS — I4891 Unspecified atrial fibrillation: Secondary | ICD-10-CM | POA: Diagnosis not present

## 2016-01-23 DIAGNOSIS — E46 Unspecified protein-calorie malnutrition: Secondary | ICD-10-CM | POA: Diagnosis not present

## 2016-01-23 DIAGNOSIS — E784 Other hyperlipidemia: Secondary | ICD-10-CM | POA: Diagnosis not present

## 2016-01-23 DIAGNOSIS — R293 Abnormal posture: Secondary | ICD-10-CM | POA: Diagnosis not present

## 2016-01-23 DIAGNOSIS — G20 Parkinson's disease: Secondary | ICD-10-CM | POA: Diagnosis not present

## 2016-01-23 DIAGNOSIS — M84459A Pathological fracture, hip, unspecified, initial encounter for fracture: Secondary | ICD-10-CM | POA: Diagnosis not present

## 2016-01-23 DIAGNOSIS — E119 Type 2 diabetes mellitus without complications: Secondary | ICD-10-CM | POA: Diagnosis not present

## 2016-01-23 DIAGNOSIS — I69022 Dysarthria following nontraumatic subarachnoid hemorrhage: Secondary | ICD-10-CM | POA: Diagnosis not present

## 2016-01-23 DIAGNOSIS — I10 Essential (primary) hypertension: Secondary | ICD-10-CM | POA: Diagnosis not present

## 2016-01-23 DIAGNOSIS — M6281 Muscle weakness (generalized): Secondary | ICD-10-CM | POA: Diagnosis not present

## 2016-01-23 DIAGNOSIS — I2510 Atherosclerotic heart disease of native coronary artery without angina pectoris: Secondary | ICD-10-CM | POA: Diagnosis not present

## 2016-01-23 DIAGNOSIS — S72002A Fracture of unspecified part of neck of left femur, initial encounter for closed fracture: Secondary | ICD-10-CM | POA: Diagnosis not present

## 2016-01-26 DIAGNOSIS — R400 Somnolence: Secondary | ICD-10-CM | POA: Diagnosis not present

## 2016-01-26 DIAGNOSIS — I10 Essential (primary) hypertension: Secondary | ICD-10-CM | POA: Diagnosis not present

## 2016-01-26 DIAGNOSIS — K210 Gastro-esophageal reflux disease with esophagitis: Secondary | ICD-10-CM | POA: Diagnosis not present

## 2016-01-26 DIAGNOSIS — M84459A Pathological fracture, hip, unspecified, initial encounter for fracture: Secondary | ICD-10-CM | POA: Diagnosis not present

## 2016-01-28 DIAGNOSIS — Z79899 Other long term (current) drug therapy: Secondary | ICD-10-CM | POA: Diagnosis not present

## 2016-01-28 DIAGNOSIS — E119 Type 2 diabetes mellitus without complications: Secondary | ICD-10-CM | POA: Diagnosis not present

## 2016-01-28 DIAGNOSIS — R5383 Other fatigue: Secondary | ICD-10-CM | POA: Diagnosis not present

## 2016-01-28 DIAGNOSIS — E785 Hyperlipidemia, unspecified: Secondary | ICD-10-CM | POA: Diagnosis not present

## 2016-02-07 DIAGNOSIS — G4700 Insomnia, unspecified: Secondary | ICD-10-CM | POA: Diagnosis not present

## 2016-02-07 DIAGNOSIS — M84459A Pathological fracture, hip, unspecified, initial encounter for fracture: Secondary | ICD-10-CM | POA: Diagnosis not present

## 2016-02-13 DIAGNOSIS — E784 Other hyperlipidemia: Secondary | ICD-10-CM | POA: Diagnosis not present

## 2016-02-13 DIAGNOSIS — I63139 Cerebral infarction due to embolism of unspecified carotid artery: Secondary | ICD-10-CM | POA: Diagnosis not present

## 2016-02-13 DIAGNOSIS — I2510 Atherosclerotic heart disease of native coronary artery without angina pectoris: Secondary | ICD-10-CM | POA: Diagnosis not present

## 2016-02-13 DIAGNOSIS — I498 Other specified cardiac arrhythmias: Secondary | ICD-10-CM | POA: Diagnosis not present

## 2016-02-13 DIAGNOSIS — I10 Essential (primary) hypertension: Secondary | ICD-10-CM | POA: Diagnosis not present

## 2016-02-15 DIAGNOSIS — I4891 Unspecified atrial fibrillation: Secondary | ICD-10-CM | POA: Diagnosis not present

## 2016-02-16 DIAGNOSIS — I63511 Cerebral infarction due to unspecified occlusion or stenosis of right middle cerebral artery: Secondary | ICD-10-CM | POA: Diagnosis not present

## 2016-02-16 DIAGNOSIS — M62838 Other muscle spasm: Secondary | ICD-10-CM | POA: Diagnosis not present

## 2016-02-16 DIAGNOSIS — G20 Parkinson's disease: Secondary | ICD-10-CM | POA: Diagnosis not present

## 2016-02-21 DIAGNOSIS — S71009A Unspecified open wound, unspecified hip, initial encounter: Secondary | ICD-10-CM | POA: Diagnosis not present

## 2016-02-27 DIAGNOSIS — S72002A Fracture of unspecified part of neck of left femur, initial encounter for closed fracture: Secondary | ICD-10-CM | POA: Diagnosis not present

## 2016-03-12 DIAGNOSIS — G4700 Insomnia, unspecified: Secondary | ICD-10-CM | POA: Diagnosis not present

## 2016-03-12 DIAGNOSIS — F321 Major depressive disorder, single episode, moderate: Secondary | ICD-10-CM | POA: Diagnosis not present

## 2016-03-15 DIAGNOSIS — I10 Essential (primary) hypertension: Secondary | ICD-10-CM | POA: Diagnosis not present

## 2016-03-15 DIAGNOSIS — I498 Other specified cardiac arrhythmias: Secondary | ICD-10-CM | POA: Diagnosis not present

## 2016-03-15 DIAGNOSIS — I63139 Cerebral infarction due to embolism of unspecified carotid artery: Secondary | ICD-10-CM | POA: Diagnosis not present

## 2016-03-17 DIAGNOSIS — E118 Type 2 diabetes mellitus with unspecified complications: Secondary | ICD-10-CM | POA: Diagnosis not present

## 2016-03-17 DIAGNOSIS — E46 Unspecified protein-calorie malnutrition: Secondary | ICD-10-CM | POA: Diagnosis not present

## 2016-03-17 DIAGNOSIS — G20 Parkinson's disease: Secondary | ICD-10-CM | POA: Diagnosis not present

## 2016-03-17 DIAGNOSIS — G8110 Spastic hemiplegia affecting unspecified side: Secondary | ICD-10-CM | POA: Diagnosis not present

## 2016-03-25 DIAGNOSIS — F323 Major depressive disorder, single episode, severe with psychotic features: Secondary | ICD-10-CM | POA: Diagnosis not present

## 2016-03-25 DIAGNOSIS — G4700 Insomnia, unspecified: Secondary | ICD-10-CM | POA: Diagnosis not present

## 2016-04-09 DIAGNOSIS — G8110 Spastic hemiplegia affecting unspecified side: Secondary | ICD-10-CM | POA: Diagnosis not present

## 2016-04-09 DIAGNOSIS — I69354 Hemiplegia and hemiparesis following cerebral infarction affecting left non-dominant side: Secondary | ICD-10-CM | POA: Diagnosis not present

## 2016-04-09 DIAGNOSIS — I679 Cerebrovascular disease, unspecified: Secondary | ICD-10-CM | POA: Diagnosis not present

## 2016-04-20 DIAGNOSIS — I69354 Hemiplegia and hemiparesis following cerebral infarction affecting left non-dominant side: Secondary | ICD-10-CM | POA: Diagnosis not present

## 2016-04-20 DIAGNOSIS — G20 Parkinson's disease: Secondary | ICD-10-CM | POA: Diagnosis not present

## 2016-04-20 DIAGNOSIS — S71009A Unspecified open wound, unspecified hip, initial encounter: Secondary | ICD-10-CM | POA: Diagnosis not present

## 2016-04-20 DIAGNOSIS — I679 Cerebrovascular disease, unspecified: Secondary | ICD-10-CM | POA: Diagnosis not present

## 2016-05-07 DIAGNOSIS — I69354 Hemiplegia and hemiparesis following cerebral infarction affecting left non-dominant side: Secondary | ICD-10-CM | POA: Diagnosis not present

## 2016-05-07 DIAGNOSIS — L719 Rosacea, unspecified: Secondary | ICD-10-CM | POA: Diagnosis not present

## 2016-05-10 DIAGNOSIS — F321 Major depressive disorder, single episode, moderate: Secondary | ICD-10-CM | POA: Diagnosis not present

## 2016-05-13 DIAGNOSIS — I63139 Cerebral infarction due to embolism of unspecified carotid artery: Secondary | ICD-10-CM | POA: Diagnosis not present

## 2016-05-17 DIAGNOSIS — F321 Major depressive disorder, single episode, moderate: Secondary | ICD-10-CM | POA: Diagnosis not present

## 2016-05-20 DIAGNOSIS — F323 Major depressive disorder, single episode, severe with psychotic features: Secondary | ICD-10-CM | POA: Diagnosis not present

## 2016-05-20 DIAGNOSIS — G4700 Insomnia, unspecified: Secondary | ICD-10-CM | POA: Diagnosis not present

## 2016-05-24 DIAGNOSIS — F321 Major depressive disorder, single episode, moderate: Secondary | ICD-10-CM | POA: Diagnosis not present

## 2016-05-28 DIAGNOSIS — S72002A Fracture of unspecified part of neck of left femur, initial encounter for closed fracture: Secondary | ICD-10-CM | POA: Diagnosis not present

## 2016-05-30 DIAGNOSIS — E119 Type 2 diabetes mellitus without complications: Secondary | ICD-10-CM | POA: Diagnosis not present

## 2016-05-31 DIAGNOSIS — F321 Major depressive disorder, single episode, moderate: Secondary | ICD-10-CM | POA: Diagnosis not present

## 2016-06-12 DIAGNOSIS — E119 Type 2 diabetes mellitus without complications: Secondary | ICD-10-CM | POA: Diagnosis not present

## 2016-06-12 DIAGNOSIS — Z6823 Body mass index (BMI) 23.0-23.9, adult: Secondary | ICD-10-CM | POA: Diagnosis not present

## 2016-06-12 DIAGNOSIS — G20 Parkinson's disease: Secondary | ICD-10-CM | POA: Diagnosis not present

## 2016-06-12 DIAGNOSIS — I69354 Hemiplegia and hemiparesis following cerebral infarction affecting left non-dominant side: Secondary | ICD-10-CM | POA: Diagnosis not present

## 2016-06-14 DIAGNOSIS — F321 Major depressive disorder, single episode, moderate: Secondary | ICD-10-CM | POA: Diagnosis not present

## 2016-06-18 DIAGNOSIS — G4700 Insomnia, unspecified: Secondary | ICD-10-CM | POA: Diagnosis not present

## 2016-06-18 DIAGNOSIS — F323 Major depressive disorder, single episode, severe with psychotic features: Secondary | ICD-10-CM | POA: Diagnosis not present

## 2016-06-21 DIAGNOSIS — F321 Major depressive disorder, single episode, moderate: Secondary | ICD-10-CM | POA: Diagnosis not present

## 2016-06-28 DIAGNOSIS — F321 Major depressive disorder, single episode, moderate: Secondary | ICD-10-CM | POA: Diagnosis not present

## 2016-07-05 DIAGNOSIS — F321 Major depressive disorder, single episode, moderate: Secondary | ICD-10-CM | POA: Diagnosis not present

## 2016-07-11 DIAGNOSIS — R293 Abnormal posture: Secondary | ICD-10-CM | POA: Diagnosis not present

## 2016-07-11 DIAGNOSIS — M6281 Muscle weakness (generalized): Secondary | ICD-10-CM | POA: Diagnosis not present

## 2016-07-11 DIAGNOSIS — I69354 Hemiplegia and hemiparesis following cerebral infarction affecting left non-dominant side: Secondary | ICD-10-CM | POA: Diagnosis not present

## 2016-07-11 DIAGNOSIS — Z9181 History of falling: Secondary | ICD-10-CM | POA: Diagnosis not present

## 2016-07-11 DIAGNOSIS — R278 Other lack of coordination: Secondary | ICD-10-CM | POA: Diagnosis not present

## 2016-07-11 DIAGNOSIS — G20 Parkinson's disease: Secondary | ICD-10-CM | POA: Diagnosis not present

## 2016-07-11 DIAGNOSIS — S72012D Unspecified intracapsular fracture of left femur, subsequent encounter for closed fracture with routine healing: Secondary | ICD-10-CM | POA: Diagnosis not present

## 2016-07-12 DIAGNOSIS — M6281 Muscle weakness (generalized): Secondary | ICD-10-CM | POA: Diagnosis not present

## 2016-07-12 DIAGNOSIS — M25642 Stiffness of left hand, not elsewhere classified: Secondary | ICD-10-CM | POA: Diagnosis not present

## 2016-07-12 DIAGNOSIS — G20 Parkinson's disease: Secondary | ICD-10-CM | POA: Diagnosis not present

## 2016-07-12 DIAGNOSIS — S72012D Unspecified intracapsular fracture of left femur, subsequent encounter for closed fracture with routine healing: Secondary | ICD-10-CM | POA: Diagnosis not present

## 2016-07-12 DIAGNOSIS — I69354 Hemiplegia and hemiparesis following cerebral infarction affecting left non-dominant side: Secondary | ICD-10-CM | POA: Diagnosis not present

## 2016-07-12 DIAGNOSIS — Z9181 History of falling: Secondary | ICD-10-CM | POA: Diagnosis not present

## 2016-07-12 DIAGNOSIS — M25622 Stiffness of left elbow, not elsewhere classified: Secondary | ICD-10-CM | POA: Diagnosis not present

## 2016-07-12 DIAGNOSIS — M2550 Pain in unspecified joint: Secondary | ICD-10-CM | POA: Diagnosis not present

## 2016-07-12 DIAGNOSIS — M25522 Pain in left elbow: Secondary | ICD-10-CM | POA: Diagnosis not present

## 2016-07-12 DIAGNOSIS — R278 Other lack of coordination: Secondary | ICD-10-CM | POA: Diagnosis not present

## 2016-07-12 DIAGNOSIS — R293 Abnormal posture: Secondary | ICD-10-CM | POA: Diagnosis not present

## 2016-07-15 DIAGNOSIS — G20 Parkinson's disease: Secondary | ICD-10-CM | POA: Diagnosis not present

## 2016-07-15 DIAGNOSIS — Z9181 History of falling: Secondary | ICD-10-CM | POA: Diagnosis not present

## 2016-07-15 DIAGNOSIS — S72012D Unspecified intracapsular fracture of left femur, subsequent encounter for closed fracture with routine healing: Secondary | ICD-10-CM | POA: Diagnosis not present

## 2016-07-15 DIAGNOSIS — I69354 Hemiplegia and hemiparesis following cerebral infarction affecting left non-dominant side: Secondary | ICD-10-CM | POA: Diagnosis not present

## 2016-07-15 DIAGNOSIS — M6281 Muscle weakness (generalized): Secondary | ICD-10-CM | POA: Diagnosis not present

## 2016-07-15 DIAGNOSIS — R278 Other lack of coordination: Secondary | ICD-10-CM | POA: Diagnosis not present

## 2016-07-16 DIAGNOSIS — I69354 Hemiplegia and hemiparesis following cerebral infarction affecting left non-dominant side: Secondary | ICD-10-CM | POA: Diagnosis not present

## 2016-07-16 DIAGNOSIS — S72012D Unspecified intracapsular fracture of left femur, subsequent encounter for closed fracture with routine healing: Secondary | ICD-10-CM | POA: Diagnosis not present

## 2016-07-16 DIAGNOSIS — Z9181 History of falling: Secondary | ICD-10-CM | POA: Diagnosis not present

## 2016-07-16 DIAGNOSIS — R278 Other lack of coordination: Secondary | ICD-10-CM | POA: Diagnosis not present

## 2016-07-16 DIAGNOSIS — M6281 Muscle weakness (generalized): Secondary | ICD-10-CM | POA: Diagnosis not present

## 2016-07-16 DIAGNOSIS — G20 Parkinson's disease: Secondary | ICD-10-CM | POA: Diagnosis not present

## 2016-07-17 DIAGNOSIS — E119 Type 2 diabetes mellitus without complications: Secondary | ICD-10-CM | POA: Diagnosis not present

## 2016-07-17 DIAGNOSIS — G8110 Spastic hemiplegia affecting unspecified side: Secondary | ICD-10-CM | POA: Diagnosis not present

## 2016-07-17 DIAGNOSIS — M6281 Muscle weakness (generalized): Secondary | ICD-10-CM | POA: Diagnosis not present

## 2016-07-17 DIAGNOSIS — Z9181 History of falling: Secondary | ICD-10-CM | POA: Diagnosis not present

## 2016-07-17 DIAGNOSIS — I679 Cerebrovascular disease, unspecified: Secondary | ICD-10-CM | POA: Diagnosis not present

## 2016-07-17 DIAGNOSIS — I69354 Hemiplegia and hemiparesis following cerebral infarction affecting left non-dominant side: Secondary | ICD-10-CM | POA: Diagnosis not present

## 2016-07-17 DIAGNOSIS — R278 Other lack of coordination: Secondary | ICD-10-CM | POA: Diagnosis not present

## 2016-07-17 DIAGNOSIS — G20 Parkinson's disease: Secondary | ICD-10-CM | POA: Diagnosis not present

## 2016-07-17 DIAGNOSIS — S72012D Unspecified intracapsular fracture of left femur, subsequent encounter for closed fracture with routine healing: Secondary | ICD-10-CM | POA: Diagnosis not present

## 2016-07-18 DIAGNOSIS — M6281 Muscle weakness (generalized): Secondary | ICD-10-CM | POA: Diagnosis not present

## 2016-07-18 DIAGNOSIS — S72012D Unspecified intracapsular fracture of left femur, subsequent encounter for closed fracture with routine healing: Secondary | ICD-10-CM | POA: Diagnosis not present

## 2016-07-18 DIAGNOSIS — I69354 Hemiplegia and hemiparesis following cerebral infarction affecting left non-dominant side: Secondary | ICD-10-CM | POA: Diagnosis not present

## 2016-07-18 DIAGNOSIS — R278 Other lack of coordination: Secondary | ICD-10-CM | POA: Diagnosis not present

## 2016-07-18 DIAGNOSIS — Z9181 History of falling: Secondary | ICD-10-CM | POA: Diagnosis not present

## 2016-07-18 DIAGNOSIS — G20 Parkinson's disease: Secondary | ICD-10-CM | POA: Diagnosis not present

## 2016-07-19 DIAGNOSIS — S72012D Unspecified intracapsular fracture of left femur, subsequent encounter for closed fracture with routine healing: Secondary | ICD-10-CM | POA: Diagnosis not present

## 2016-07-19 DIAGNOSIS — Z9181 History of falling: Secondary | ICD-10-CM | POA: Diagnosis not present

## 2016-07-19 DIAGNOSIS — R278 Other lack of coordination: Secondary | ICD-10-CM | POA: Diagnosis not present

## 2016-07-19 DIAGNOSIS — I69354 Hemiplegia and hemiparesis following cerebral infarction affecting left non-dominant side: Secondary | ICD-10-CM | POA: Diagnosis not present

## 2016-07-19 DIAGNOSIS — M6281 Muscle weakness (generalized): Secondary | ICD-10-CM | POA: Diagnosis not present

## 2016-07-19 DIAGNOSIS — G20 Parkinson's disease: Secondary | ICD-10-CM | POA: Diagnosis not present

## 2016-07-22 DIAGNOSIS — S72012D Unspecified intracapsular fracture of left femur, subsequent encounter for closed fracture with routine healing: Secondary | ICD-10-CM | POA: Diagnosis not present

## 2016-07-22 DIAGNOSIS — M6281 Muscle weakness (generalized): Secondary | ICD-10-CM | POA: Diagnosis not present

## 2016-07-22 DIAGNOSIS — Z9181 History of falling: Secondary | ICD-10-CM | POA: Diagnosis not present

## 2016-07-22 DIAGNOSIS — I69354 Hemiplegia and hemiparesis following cerebral infarction affecting left non-dominant side: Secondary | ICD-10-CM | POA: Diagnosis not present

## 2016-07-22 DIAGNOSIS — R278 Other lack of coordination: Secondary | ICD-10-CM | POA: Diagnosis not present

## 2016-07-22 DIAGNOSIS — G20 Parkinson's disease: Secondary | ICD-10-CM | POA: Diagnosis not present

## 2016-07-23 DIAGNOSIS — R278 Other lack of coordination: Secondary | ICD-10-CM | POA: Diagnosis not present

## 2016-07-23 DIAGNOSIS — M6281 Muscle weakness (generalized): Secondary | ICD-10-CM | POA: Diagnosis not present

## 2016-07-23 DIAGNOSIS — S72012D Unspecified intracapsular fracture of left femur, subsequent encounter for closed fracture with routine healing: Secondary | ICD-10-CM | POA: Diagnosis not present

## 2016-07-23 DIAGNOSIS — G20 Parkinson's disease: Secondary | ICD-10-CM | POA: Diagnosis not present

## 2016-07-23 DIAGNOSIS — I69354 Hemiplegia and hemiparesis following cerebral infarction affecting left non-dominant side: Secondary | ICD-10-CM | POA: Diagnosis not present

## 2016-07-23 DIAGNOSIS — Z9181 History of falling: Secondary | ICD-10-CM | POA: Diagnosis not present

## 2016-07-24 DIAGNOSIS — G20 Parkinson's disease: Secondary | ICD-10-CM | POA: Diagnosis not present

## 2016-07-24 DIAGNOSIS — Z9181 History of falling: Secondary | ICD-10-CM | POA: Diagnosis not present

## 2016-07-24 DIAGNOSIS — M6281 Muscle weakness (generalized): Secondary | ICD-10-CM | POA: Diagnosis not present

## 2016-07-24 DIAGNOSIS — R278 Other lack of coordination: Secondary | ICD-10-CM | POA: Diagnosis not present

## 2016-07-24 DIAGNOSIS — S72012D Unspecified intracapsular fracture of left femur, subsequent encounter for closed fracture with routine healing: Secondary | ICD-10-CM | POA: Diagnosis not present

## 2016-07-24 DIAGNOSIS — I69354 Hemiplegia and hemiparesis following cerebral infarction affecting left non-dominant side: Secondary | ICD-10-CM | POA: Diagnosis not present

## 2016-07-25 DIAGNOSIS — S72012D Unspecified intracapsular fracture of left femur, subsequent encounter for closed fracture with routine healing: Secondary | ICD-10-CM | POA: Diagnosis not present

## 2016-07-25 DIAGNOSIS — G20 Parkinson's disease: Secondary | ICD-10-CM | POA: Diagnosis not present

## 2016-07-25 DIAGNOSIS — Z9181 History of falling: Secondary | ICD-10-CM | POA: Diagnosis not present

## 2016-07-25 DIAGNOSIS — I69354 Hemiplegia and hemiparesis following cerebral infarction affecting left non-dominant side: Secondary | ICD-10-CM | POA: Diagnosis not present

## 2016-07-25 DIAGNOSIS — M6281 Muscle weakness (generalized): Secondary | ICD-10-CM | POA: Diagnosis not present

## 2016-07-25 DIAGNOSIS — R278 Other lack of coordination: Secondary | ICD-10-CM | POA: Diagnosis not present

## 2016-07-26 DIAGNOSIS — G20 Parkinson's disease: Secondary | ICD-10-CM | POA: Diagnosis not present

## 2016-07-26 DIAGNOSIS — R278 Other lack of coordination: Secondary | ICD-10-CM | POA: Diagnosis not present

## 2016-07-26 DIAGNOSIS — M6281 Muscle weakness (generalized): Secondary | ICD-10-CM | POA: Diagnosis not present

## 2016-07-26 DIAGNOSIS — Z9181 History of falling: Secondary | ICD-10-CM | POA: Diagnosis not present

## 2016-07-26 DIAGNOSIS — I69354 Hemiplegia and hemiparesis following cerebral infarction affecting left non-dominant side: Secondary | ICD-10-CM | POA: Diagnosis not present

## 2016-07-26 DIAGNOSIS — S72012D Unspecified intracapsular fracture of left femur, subsequent encounter for closed fracture with routine healing: Secondary | ICD-10-CM | POA: Diagnosis not present

## 2016-07-29 DIAGNOSIS — G20 Parkinson's disease: Secondary | ICD-10-CM | POA: Diagnosis not present

## 2016-07-29 DIAGNOSIS — R278 Other lack of coordination: Secondary | ICD-10-CM | POA: Diagnosis not present

## 2016-07-29 DIAGNOSIS — Z9181 History of falling: Secondary | ICD-10-CM | POA: Diagnosis not present

## 2016-07-29 DIAGNOSIS — M6281 Muscle weakness (generalized): Secondary | ICD-10-CM | POA: Diagnosis not present

## 2016-07-29 DIAGNOSIS — S72012D Unspecified intracapsular fracture of left femur, subsequent encounter for closed fracture with routine healing: Secondary | ICD-10-CM | POA: Diagnosis not present

## 2016-07-29 DIAGNOSIS — I69354 Hemiplegia and hemiparesis following cerebral infarction affecting left non-dominant side: Secondary | ICD-10-CM | POA: Diagnosis not present

## 2016-07-30 DIAGNOSIS — Z9181 History of falling: Secondary | ICD-10-CM | POA: Diagnosis not present

## 2016-07-30 DIAGNOSIS — M6281 Muscle weakness (generalized): Secondary | ICD-10-CM | POA: Diagnosis not present

## 2016-07-30 DIAGNOSIS — I69354 Hemiplegia and hemiparesis following cerebral infarction affecting left non-dominant side: Secondary | ICD-10-CM | POA: Diagnosis not present

## 2016-07-30 DIAGNOSIS — S72012D Unspecified intracapsular fracture of left femur, subsequent encounter for closed fracture with routine healing: Secondary | ICD-10-CM | POA: Diagnosis not present

## 2016-07-30 DIAGNOSIS — R278 Other lack of coordination: Secondary | ICD-10-CM | POA: Diagnosis not present

## 2016-07-30 DIAGNOSIS — G20 Parkinson's disease: Secondary | ICD-10-CM | POA: Diagnosis not present

## 2016-07-31 DIAGNOSIS — G20 Parkinson's disease: Secondary | ICD-10-CM | POA: Diagnosis not present

## 2016-07-31 DIAGNOSIS — Z9181 History of falling: Secondary | ICD-10-CM | POA: Diagnosis not present

## 2016-07-31 DIAGNOSIS — R278 Other lack of coordination: Secondary | ICD-10-CM | POA: Diagnosis not present

## 2016-07-31 DIAGNOSIS — S72012D Unspecified intracapsular fracture of left femur, subsequent encounter for closed fracture with routine healing: Secondary | ICD-10-CM | POA: Diagnosis not present

## 2016-07-31 DIAGNOSIS — M6281 Muscle weakness (generalized): Secondary | ICD-10-CM | POA: Diagnosis not present

## 2016-07-31 DIAGNOSIS — I69354 Hemiplegia and hemiparesis following cerebral infarction affecting left non-dominant side: Secondary | ICD-10-CM | POA: Diagnosis not present

## 2016-08-01 DIAGNOSIS — M6281 Muscle weakness (generalized): Secondary | ICD-10-CM | POA: Diagnosis not present

## 2016-08-01 DIAGNOSIS — I69354 Hemiplegia and hemiparesis following cerebral infarction affecting left non-dominant side: Secondary | ICD-10-CM | POA: Diagnosis not present

## 2016-08-01 DIAGNOSIS — Z9181 History of falling: Secondary | ICD-10-CM | POA: Diagnosis not present

## 2016-08-01 DIAGNOSIS — R278 Other lack of coordination: Secondary | ICD-10-CM | POA: Diagnosis not present

## 2016-08-01 DIAGNOSIS — S72012D Unspecified intracapsular fracture of left femur, subsequent encounter for closed fracture with routine healing: Secondary | ICD-10-CM | POA: Diagnosis not present

## 2016-08-01 DIAGNOSIS — G20 Parkinson's disease: Secondary | ICD-10-CM | POA: Diagnosis not present

## 2016-08-02 DIAGNOSIS — I69354 Hemiplegia and hemiparesis following cerebral infarction affecting left non-dominant side: Secondary | ICD-10-CM | POA: Diagnosis not present

## 2016-08-02 DIAGNOSIS — F321 Major depressive disorder, single episode, moderate: Secondary | ICD-10-CM | POA: Diagnosis not present

## 2016-08-02 DIAGNOSIS — Z9181 History of falling: Secondary | ICD-10-CM | POA: Diagnosis not present

## 2016-08-02 DIAGNOSIS — M6281 Muscle weakness (generalized): Secondary | ICD-10-CM | POA: Diagnosis not present

## 2016-08-02 DIAGNOSIS — S72012D Unspecified intracapsular fracture of left femur, subsequent encounter for closed fracture with routine healing: Secondary | ICD-10-CM | POA: Diagnosis not present

## 2016-08-02 DIAGNOSIS — G20 Parkinson's disease: Secondary | ICD-10-CM | POA: Diagnosis not present

## 2016-08-02 DIAGNOSIS — R278 Other lack of coordination: Secondary | ICD-10-CM | POA: Diagnosis not present

## 2016-08-05 DIAGNOSIS — Z9181 History of falling: Secondary | ICD-10-CM | POA: Diagnosis not present

## 2016-08-05 DIAGNOSIS — S72012D Unspecified intracapsular fracture of left femur, subsequent encounter for closed fracture with routine healing: Secondary | ICD-10-CM | POA: Diagnosis not present

## 2016-08-05 DIAGNOSIS — M6281 Muscle weakness (generalized): Secondary | ICD-10-CM | POA: Diagnosis not present

## 2016-08-05 DIAGNOSIS — G20 Parkinson's disease: Secondary | ICD-10-CM | POA: Diagnosis not present

## 2016-08-05 DIAGNOSIS — I69354 Hemiplegia and hemiparesis following cerebral infarction affecting left non-dominant side: Secondary | ICD-10-CM | POA: Diagnosis not present

## 2016-08-05 DIAGNOSIS — R278 Other lack of coordination: Secondary | ICD-10-CM | POA: Diagnosis not present

## 2016-08-06 DIAGNOSIS — S72012D Unspecified intracapsular fracture of left femur, subsequent encounter for closed fracture with routine healing: Secondary | ICD-10-CM | POA: Diagnosis not present

## 2016-08-06 DIAGNOSIS — G4700 Insomnia, unspecified: Secondary | ICD-10-CM | POA: Diagnosis not present

## 2016-08-06 DIAGNOSIS — M6281 Muscle weakness (generalized): Secondary | ICD-10-CM | POA: Diagnosis not present

## 2016-08-06 DIAGNOSIS — G20 Parkinson's disease: Secondary | ICD-10-CM | POA: Diagnosis not present

## 2016-08-06 DIAGNOSIS — F323 Major depressive disorder, single episode, severe with psychotic features: Secondary | ICD-10-CM | POA: Diagnosis not present

## 2016-08-06 DIAGNOSIS — I69354 Hemiplegia and hemiparesis following cerebral infarction affecting left non-dominant side: Secondary | ICD-10-CM | POA: Diagnosis not present

## 2016-08-06 DIAGNOSIS — R278 Other lack of coordination: Secondary | ICD-10-CM | POA: Diagnosis not present

## 2016-08-06 DIAGNOSIS — Z9181 History of falling: Secondary | ICD-10-CM | POA: Diagnosis not present

## 2016-08-07 DIAGNOSIS — S72012D Unspecified intracapsular fracture of left femur, subsequent encounter for closed fracture with routine healing: Secondary | ICD-10-CM | POA: Diagnosis not present

## 2016-08-07 DIAGNOSIS — Z9181 History of falling: Secondary | ICD-10-CM | POA: Diagnosis not present

## 2016-08-07 DIAGNOSIS — I69354 Hemiplegia and hemiparesis following cerebral infarction affecting left non-dominant side: Secondary | ICD-10-CM | POA: Diagnosis not present

## 2016-08-07 DIAGNOSIS — R278 Other lack of coordination: Secondary | ICD-10-CM | POA: Diagnosis not present

## 2016-08-07 DIAGNOSIS — M6281 Muscle weakness (generalized): Secondary | ICD-10-CM | POA: Diagnosis not present

## 2016-08-07 DIAGNOSIS — G20 Parkinson's disease: Secondary | ICD-10-CM | POA: Diagnosis not present

## 2016-08-08 DIAGNOSIS — R278 Other lack of coordination: Secondary | ICD-10-CM | POA: Diagnosis not present

## 2016-08-08 DIAGNOSIS — S72012D Unspecified intracapsular fracture of left femur, subsequent encounter for closed fracture with routine healing: Secondary | ICD-10-CM | POA: Diagnosis not present

## 2016-08-08 DIAGNOSIS — M6281 Muscle weakness (generalized): Secondary | ICD-10-CM | POA: Diagnosis not present

## 2016-08-08 DIAGNOSIS — Z9181 History of falling: Secondary | ICD-10-CM | POA: Diagnosis not present

## 2016-08-08 DIAGNOSIS — I69354 Hemiplegia and hemiparesis following cerebral infarction affecting left non-dominant side: Secondary | ICD-10-CM | POA: Diagnosis not present

## 2016-08-08 DIAGNOSIS — G20 Parkinson's disease: Secondary | ICD-10-CM | POA: Diagnosis not present

## 2016-08-09 DIAGNOSIS — G20 Parkinson's disease: Secondary | ICD-10-CM | POA: Diagnosis not present

## 2016-08-09 DIAGNOSIS — Z9181 History of falling: Secondary | ICD-10-CM | POA: Diagnosis not present

## 2016-08-09 DIAGNOSIS — S72012D Unspecified intracapsular fracture of left femur, subsequent encounter for closed fracture with routine healing: Secondary | ICD-10-CM | POA: Diagnosis not present

## 2016-08-09 DIAGNOSIS — R278 Other lack of coordination: Secondary | ICD-10-CM | POA: Diagnosis not present

## 2016-08-09 DIAGNOSIS — I69354 Hemiplegia and hemiparesis following cerebral infarction affecting left non-dominant side: Secondary | ICD-10-CM | POA: Diagnosis not present

## 2016-08-09 DIAGNOSIS — M6281 Muscle weakness (generalized): Secondary | ICD-10-CM | POA: Diagnosis not present

## 2016-08-09 DIAGNOSIS — F321 Major depressive disorder, single episode, moderate: Secondary | ICD-10-CM | POA: Diagnosis not present

## 2016-08-12 DIAGNOSIS — R1312 Dysphagia, oropharyngeal phase: Secondary | ICD-10-CM | POA: Diagnosis not present

## 2016-08-12 DIAGNOSIS — R293 Abnormal posture: Secondary | ICD-10-CM | POA: Diagnosis not present

## 2016-08-12 DIAGNOSIS — M25642 Stiffness of left hand, not elsewhere classified: Secondary | ICD-10-CM | POA: Diagnosis not present

## 2016-08-12 DIAGNOSIS — I69354 Hemiplegia and hemiparesis following cerebral infarction affecting left non-dominant side: Secondary | ICD-10-CM | POA: Diagnosis not present

## 2016-08-12 DIAGNOSIS — M6281 Muscle weakness (generalized): Secondary | ICD-10-CM | POA: Diagnosis not present

## 2016-08-12 DIAGNOSIS — S72012D Unspecified intracapsular fracture of left femur, subsequent encounter for closed fracture with routine healing: Secondary | ICD-10-CM | POA: Diagnosis not present

## 2016-08-12 DIAGNOSIS — M25522 Pain in left elbow: Secondary | ICD-10-CM | POA: Diagnosis not present

## 2016-08-12 DIAGNOSIS — M2550 Pain in unspecified joint: Secondary | ICD-10-CM | POA: Diagnosis not present

## 2016-08-12 DIAGNOSIS — M25622 Stiffness of left elbow, not elsewhere classified: Secondary | ICD-10-CM | POA: Diagnosis not present

## 2016-08-12 DIAGNOSIS — R278 Other lack of coordination: Secondary | ICD-10-CM | POA: Diagnosis not present

## 2016-08-12 DIAGNOSIS — G20 Parkinson's disease: Secondary | ICD-10-CM | POA: Diagnosis not present

## 2016-08-12 DIAGNOSIS — Z9181 History of falling: Secondary | ICD-10-CM | POA: Diagnosis not present

## 2016-08-13 DIAGNOSIS — R278 Other lack of coordination: Secondary | ICD-10-CM | POA: Diagnosis not present

## 2016-08-13 DIAGNOSIS — Z9181 History of falling: Secondary | ICD-10-CM | POA: Diagnosis not present

## 2016-08-13 DIAGNOSIS — I69354 Hemiplegia and hemiparesis following cerebral infarction affecting left non-dominant side: Secondary | ICD-10-CM | POA: Diagnosis not present

## 2016-08-13 DIAGNOSIS — G20 Parkinson's disease: Secondary | ICD-10-CM | POA: Diagnosis not present

## 2016-08-13 DIAGNOSIS — S72012D Unspecified intracapsular fracture of left femur, subsequent encounter for closed fracture with routine healing: Secondary | ICD-10-CM | POA: Diagnosis not present

## 2016-08-13 DIAGNOSIS — M6281 Muscle weakness (generalized): Secondary | ICD-10-CM | POA: Diagnosis not present

## 2016-08-14 DIAGNOSIS — I69354 Hemiplegia and hemiparesis following cerebral infarction affecting left non-dominant side: Secondary | ICD-10-CM | POA: Diagnosis not present

## 2016-08-14 DIAGNOSIS — M6281 Muscle weakness (generalized): Secondary | ICD-10-CM | POA: Diagnosis not present

## 2016-08-14 DIAGNOSIS — G20 Parkinson's disease: Secondary | ICD-10-CM | POA: Diagnosis not present

## 2016-08-14 DIAGNOSIS — R278 Other lack of coordination: Secondary | ICD-10-CM | POA: Diagnosis not present

## 2016-08-14 DIAGNOSIS — S72012D Unspecified intracapsular fracture of left femur, subsequent encounter for closed fracture with routine healing: Secondary | ICD-10-CM | POA: Diagnosis not present

## 2016-08-14 DIAGNOSIS — Z9181 History of falling: Secondary | ICD-10-CM | POA: Diagnosis not present

## 2016-08-15 DIAGNOSIS — I69354 Hemiplegia and hemiparesis following cerebral infarction affecting left non-dominant side: Secondary | ICD-10-CM | POA: Diagnosis not present

## 2016-08-15 DIAGNOSIS — G20 Parkinson's disease: Secondary | ICD-10-CM | POA: Diagnosis not present

## 2016-08-15 DIAGNOSIS — R278 Other lack of coordination: Secondary | ICD-10-CM | POA: Diagnosis not present

## 2016-08-15 DIAGNOSIS — M6281 Muscle weakness (generalized): Secondary | ICD-10-CM | POA: Diagnosis not present

## 2016-08-15 DIAGNOSIS — Z9181 History of falling: Secondary | ICD-10-CM | POA: Diagnosis not present

## 2016-08-15 DIAGNOSIS — S72012D Unspecified intracapsular fracture of left femur, subsequent encounter for closed fracture with routine healing: Secondary | ICD-10-CM | POA: Diagnosis not present

## 2016-08-16 DIAGNOSIS — M6281 Muscle weakness (generalized): Secondary | ICD-10-CM | POA: Diagnosis not present

## 2016-08-16 DIAGNOSIS — G20 Parkinson's disease: Secondary | ICD-10-CM | POA: Diagnosis not present

## 2016-08-16 DIAGNOSIS — R278 Other lack of coordination: Secondary | ICD-10-CM | POA: Diagnosis not present

## 2016-08-16 DIAGNOSIS — S72012D Unspecified intracapsular fracture of left femur, subsequent encounter for closed fracture with routine healing: Secondary | ICD-10-CM | POA: Diagnosis not present

## 2016-08-16 DIAGNOSIS — F321 Major depressive disorder, single episode, moderate: Secondary | ICD-10-CM | POA: Diagnosis not present

## 2016-08-16 DIAGNOSIS — Z9181 History of falling: Secondary | ICD-10-CM | POA: Diagnosis not present

## 2016-08-16 DIAGNOSIS — I69354 Hemiplegia and hemiparesis following cerebral infarction affecting left non-dominant side: Secondary | ICD-10-CM | POA: Diagnosis not present

## 2016-08-19 DIAGNOSIS — I69354 Hemiplegia and hemiparesis following cerebral infarction affecting left non-dominant side: Secondary | ICD-10-CM | POA: Diagnosis not present

## 2016-08-19 DIAGNOSIS — R278 Other lack of coordination: Secondary | ICD-10-CM | POA: Diagnosis not present

## 2016-08-19 DIAGNOSIS — M6281 Muscle weakness (generalized): Secondary | ICD-10-CM | POA: Diagnosis not present

## 2016-08-19 DIAGNOSIS — S72012D Unspecified intracapsular fracture of left femur, subsequent encounter for closed fracture with routine healing: Secondary | ICD-10-CM | POA: Diagnosis not present

## 2016-08-19 DIAGNOSIS — G20 Parkinson's disease: Secondary | ICD-10-CM | POA: Diagnosis not present

## 2016-08-19 DIAGNOSIS — Z9181 History of falling: Secondary | ICD-10-CM | POA: Diagnosis not present

## 2016-08-20 DIAGNOSIS — R278 Other lack of coordination: Secondary | ICD-10-CM | POA: Diagnosis not present

## 2016-08-20 DIAGNOSIS — R1310 Dysphagia, unspecified: Secondary | ICD-10-CM | POA: Diagnosis not present

## 2016-08-20 DIAGNOSIS — Z9181 History of falling: Secondary | ICD-10-CM | POA: Diagnosis not present

## 2016-08-20 DIAGNOSIS — R062 Wheezing: Secondary | ICD-10-CM | POA: Diagnosis not present

## 2016-08-20 DIAGNOSIS — M6281 Muscle weakness (generalized): Secondary | ICD-10-CM | POA: Diagnosis not present

## 2016-08-20 DIAGNOSIS — S72012D Unspecified intracapsular fracture of left femur, subsequent encounter for closed fracture with routine healing: Secondary | ICD-10-CM | POA: Diagnosis not present

## 2016-08-20 DIAGNOSIS — G20 Parkinson's disease: Secondary | ICD-10-CM | POA: Diagnosis not present

## 2016-08-20 DIAGNOSIS — I69354 Hemiplegia and hemiparesis following cerebral infarction affecting left non-dominant side: Secondary | ICD-10-CM | POA: Diagnosis not present

## 2016-08-21 DIAGNOSIS — R278 Other lack of coordination: Secondary | ICD-10-CM | POA: Diagnosis not present

## 2016-08-21 DIAGNOSIS — M6281 Muscle weakness (generalized): Secondary | ICD-10-CM | POA: Diagnosis not present

## 2016-08-21 DIAGNOSIS — S72012D Unspecified intracapsular fracture of left femur, subsequent encounter for closed fracture with routine healing: Secondary | ICD-10-CM | POA: Diagnosis not present

## 2016-08-21 DIAGNOSIS — Z9181 History of falling: Secondary | ICD-10-CM | POA: Diagnosis not present

## 2016-08-21 DIAGNOSIS — G20 Parkinson's disease: Secondary | ICD-10-CM | POA: Diagnosis not present

## 2016-08-21 DIAGNOSIS — I69354 Hemiplegia and hemiparesis following cerebral infarction affecting left non-dominant side: Secondary | ICD-10-CM | POA: Diagnosis not present

## 2016-08-22 DIAGNOSIS — S72012D Unspecified intracapsular fracture of left femur, subsequent encounter for closed fracture with routine healing: Secondary | ICD-10-CM | POA: Diagnosis not present

## 2016-08-22 DIAGNOSIS — G20 Parkinson's disease: Secondary | ICD-10-CM | POA: Diagnosis not present

## 2016-08-22 DIAGNOSIS — R278 Other lack of coordination: Secondary | ICD-10-CM | POA: Diagnosis not present

## 2016-08-22 DIAGNOSIS — M6281 Muscle weakness (generalized): Secondary | ICD-10-CM | POA: Diagnosis not present

## 2016-08-22 DIAGNOSIS — Z9181 History of falling: Secondary | ICD-10-CM | POA: Diagnosis not present

## 2016-08-22 DIAGNOSIS — I69354 Hemiplegia and hemiparesis following cerebral infarction affecting left non-dominant side: Secondary | ICD-10-CM | POA: Diagnosis not present

## 2016-08-23 DIAGNOSIS — R278 Other lack of coordination: Secondary | ICD-10-CM | POA: Diagnosis not present

## 2016-08-23 DIAGNOSIS — Z9181 History of falling: Secondary | ICD-10-CM | POA: Diagnosis not present

## 2016-08-23 DIAGNOSIS — G20 Parkinson's disease: Secondary | ICD-10-CM | POA: Diagnosis not present

## 2016-08-23 DIAGNOSIS — M6281 Muscle weakness (generalized): Secondary | ICD-10-CM | POA: Diagnosis not present

## 2016-08-23 DIAGNOSIS — F321 Major depressive disorder, single episode, moderate: Secondary | ICD-10-CM | POA: Diagnosis not present

## 2016-08-23 DIAGNOSIS — I69354 Hemiplegia and hemiparesis following cerebral infarction affecting left non-dominant side: Secondary | ICD-10-CM | POA: Diagnosis not present

## 2016-08-23 DIAGNOSIS — S72012D Unspecified intracapsular fracture of left femur, subsequent encounter for closed fracture with routine healing: Secondary | ICD-10-CM | POA: Diagnosis not present

## 2016-08-26 DIAGNOSIS — G20 Parkinson's disease: Secondary | ICD-10-CM | POA: Diagnosis not present

## 2016-08-26 DIAGNOSIS — M6281 Muscle weakness (generalized): Secondary | ICD-10-CM | POA: Diagnosis not present

## 2016-08-26 DIAGNOSIS — I69354 Hemiplegia and hemiparesis following cerebral infarction affecting left non-dominant side: Secondary | ICD-10-CM | POA: Diagnosis not present

## 2016-08-26 DIAGNOSIS — Z9181 History of falling: Secondary | ICD-10-CM | POA: Diagnosis not present

## 2016-08-26 DIAGNOSIS — S72012D Unspecified intracapsular fracture of left femur, subsequent encounter for closed fracture with routine healing: Secondary | ICD-10-CM | POA: Diagnosis not present

## 2016-08-26 DIAGNOSIS — R278 Other lack of coordination: Secondary | ICD-10-CM | POA: Diagnosis not present

## 2016-08-27 DIAGNOSIS — Z9181 History of falling: Secondary | ICD-10-CM | POA: Diagnosis not present

## 2016-08-27 DIAGNOSIS — M6281 Muscle weakness (generalized): Secondary | ICD-10-CM | POA: Diagnosis not present

## 2016-08-27 DIAGNOSIS — I69354 Hemiplegia and hemiparesis following cerebral infarction affecting left non-dominant side: Secondary | ICD-10-CM | POA: Diagnosis not present

## 2016-08-27 DIAGNOSIS — G20 Parkinson's disease: Secondary | ICD-10-CM | POA: Diagnosis not present

## 2016-08-27 DIAGNOSIS — S72012D Unspecified intracapsular fracture of left femur, subsequent encounter for closed fracture with routine healing: Secondary | ICD-10-CM | POA: Diagnosis not present

## 2016-08-27 DIAGNOSIS — R278 Other lack of coordination: Secondary | ICD-10-CM | POA: Diagnosis not present

## 2016-08-28 DIAGNOSIS — S72012D Unspecified intracapsular fracture of left femur, subsequent encounter for closed fracture with routine healing: Secondary | ICD-10-CM | POA: Diagnosis not present

## 2016-08-28 DIAGNOSIS — M6281 Muscle weakness (generalized): Secondary | ICD-10-CM | POA: Diagnosis not present

## 2016-08-28 DIAGNOSIS — I69354 Hemiplegia and hemiparesis following cerebral infarction affecting left non-dominant side: Secondary | ICD-10-CM | POA: Diagnosis not present

## 2016-08-28 DIAGNOSIS — Z9181 History of falling: Secondary | ICD-10-CM | POA: Diagnosis not present

## 2016-08-28 DIAGNOSIS — G20 Parkinson's disease: Secondary | ICD-10-CM | POA: Diagnosis not present

## 2016-08-28 DIAGNOSIS — R278 Other lack of coordination: Secondary | ICD-10-CM | POA: Diagnosis not present

## 2016-08-29 DIAGNOSIS — G20 Parkinson's disease: Secondary | ICD-10-CM | POA: Diagnosis not present

## 2016-08-29 DIAGNOSIS — M6281 Muscle weakness (generalized): Secondary | ICD-10-CM | POA: Diagnosis not present

## 2016-08-29 DIAGNOSIS — Z9181 History of falling: Secondary | ICD-10-CM | POA: Diagnosis not present

## 2016-08-29 DIAGNOSIS — R278 Other lack of coordination: Secondary | ICD-10-CM | POA: Diagnosis not present

## 2016-08-29 DIAGNOSIS — I69354 Hemiplegia and hemiparesis following cerebral infarction affecting left non-dominant side: Secondary | ICD-10-CM | POA: Diagnosis not present

## 2016-08-29 DIAGNOSIS — S72012D Unspecified intracapsular fracture of left femur, subsequent encounter for closed fracture with routine healing: Secondary | ICD-10-CM | POA: Diagnosis not present

## 2016-08-30 DIAGNOSIS — S72012D Unspecified intracapsular fracture of left femur, subsequent encounter for closed fracture with routine healing: Secondary | ICD-10-CM | POA: Diagnosis not present

## 2016-08-30 DIAGNOSIS — G20 Parkinson's disease: Secondary | ICD-10-CM | POA: Diagnosis not present

## 2016-08-30 DIAGNOSIS — M6281 Muscle weakness (generalized): Secondary | ICD-10-CM | POA: Diagnosis not present

## 2016-08-30 DIAGNOSIS — F321 Major depressive disorder, single episode, moderate: Secondary | ICD-10-CM | POA: Diagnosis not present

## 2016-08-30 DIAGNOSIS — R278 Other lack of coordination: Secondary | ICD-10-CM | POA: Diagnosis not present

## 2016-08-30 DIAGNOSIS — I69354 Hemiplegia and hemiparesis following cerebral infarction affecting left non-dominant side: Secondary | ICD-10-CM | POA: Diagnosis not present

## 2016-08-30 DIAGNOSIS — Z9181 History of falling: Secondary | ICD-10-CM | POA: Diagnosis not present

## 2016-09-02 DIAGNOSIS — I69354 Hemiplegia and hemiparesis following cerebral infarction affecting left non-dominant side: Secondary | ICD-10-CM | POA: Diagnosis not present

## 2016-09-02 DIAGNOSIS — M6281 Muscle weakness (generalized): Secondary | ICD-10-CM | POA: Diagnosis not present

## 2016-09-02 DIAGNOSIS — Z9181 History of falling: Secondary | ICD-10-CM | POA: Diagnosis not present

## 2016-09-02 DIAGNOSIS — R278 Other lack of coordination: Secondary | ICD-10-CM | POA: Diagnosis not present

## 2016-09-02 DIAGNOSIS — G20 Parkinson's disease: Secondary | ICD-10-CM | POA: Diagnosis not present

## 2016-09-02 DIAGNOSIS — S72012D Unspecified intracapsular fracture of left femur, subsequent encounter for closed fracture with routine healing: Secondary | ICD-10-CM | POA: Diagnosis not present

## 2016-09-03 DIAGNOSIS — Z9181 History of falling: Secondary | ICD-10-CM | POA: Diagnosis not present

## 2016-09-03 DIAGNOSIS — R278 Other lack of coordination: Secondary | ICD-10-CM | POA: Diagnosis not present

## 2016-09-03 DIAGNOSIS — S72012D Unspecified intracapsular fracture of left femur, subsequent encounter for closed fracture with routine healing: Secondary | ICD-10-CM | POA: Diagnosis not present

## 2016-09-03 DIAGNOSIS — M6281 Muscle weakness (generalized): Secondary | ICD-10-CM | POA: Diagnosis not present

## 2016-09-03 DIAGNOSIS — G20 Parkinson's disease: Secondary | ICD-10-CM | POA: Diagnosis not present

## 2016-09-03 DIAGNOSIS — I69354 Hemiplegia and hemiparesis following cerebral infarction affecting left non-dominant side: Secondary | ICD-10-CM | POA: Diagnosis not present

## 2016-09-04 DIAGNOSIS — G20 Parkinson's disease: Secondary | ICD-10-CM | POA: Diagnosis not present

## 2016-09-04 DIAGNOSIS — M6281 Muscle weakness (generalized): Secondary | ICD-10-CM | POA: Diagnosis not present

## 2016-09-04 DIAGNOSIS — S72012D Unspecified intracapsular fracture of left femur, subsequent encounter for closed fracture with routine healing: Secondary | ICD-10-CM | POA: Diagnosis not present

## 2016-09-04 DIAGNOSIS — Z9181 History of falling: Secondary | ICD-10-CM | POA: Diagnosis not present

## 2016-09-04 DIAGNOSIS — I69354 Hemiplegia and hemiparesis following cerebral infarction affecting left non-dominant side: Secondary | ICD-10-CM | POA: Diagnosis not present

## 2016-09-04 DIAGNOSIS — R278 Other lack of coordination: Secondary | ICD-10-CM | POA: Diagnosis not present

## 2016-09-12 DIAGNOSIS — I2510 Atherosclerotic heart disease of native coronary artery without angina pectoris: Secondary | ICD-10-CM | POA: Diagnosis not present

## 2016-09-12 DIAGNOSIS — Z6824 Body mass index (BMI) 24.0-24.9, adult: Secondary | ICD-10-CM | POA: Diagnosis not present

## 2016-09-12 DIAGNOSIS — I69354 Hemiplegia and hemiparesis following cerebral infarction affecting left non-dominant side: Secondary | ICD-10-CM | POA: Diagnosis not present

## 2016-09-12 DIAGNOSIS — E119 Type 2 diabetes mellitus without complications: Secondary | ICD-10-CM | POA: Diagnosis not present

## 2016-09-17 DIAGNOSIS — F323 Major depressive disorder, single episode, severe with psychotic features: Secondary | ICD-10-CM | POA: Diagnosis not present

## 2016-09-17 DIAGNOSIS — G4700 Insomnia, unspecified: Secondary | ICD-10-CM | POA: Diagnosis not present

## 2016-09-27 DIAGNOSIS — E785 Hyperlipidemia, unspecified: Secondary | ICD-10-CM | POA: Diagnosis not present

## 2016-09-27 DIAGNOSIS — Z79899 Other long term (current) drug therapy: Secondary | ICD-10-CM | POA: Diagnosis not present

## 2016-09-30 DIAGNOSIS — N390 Urinary tract infection, site not specified: Secondary | ICD-10-CM | POA: Diagnosis not present

## 2016-10-01 DIAGNOSIS — N390 Urinary tract infection, site not specified: Secondary | ICD-10-CM | POA: Diagnosis not present

## 2016-10-15 DIAGNOSIS — G4700 Insomnia, unspecified: Secondary | ICD-10-CM | POA: Diagnosis not present

## 2016-10-15 DIAGNOSIS — F323 Major depressive disorder, single episode, severe with psychotic features: Secondary | ICD-10-CM | POA: Diagnosis not present

## 2016-10-24 DIAGNOSIS — I2510 Atherosclerotic heart disease of native coronary artery without angina pectoris: Secondary | ICD-10-CM | POA: Diagnosis not present

## 2016-10-24 DIAGNOSIS — G20 Parkinson's disease: Secondary | ICD-10-CM | POA: Diagnosis not present

## 2016-10-24 DIAGNOSIS — E119 Type 2 diabetes mellitus without complications: Secondary | ICD-10-CM | POA: Diagnosis not present

## 2016-10-24 DIAGNOSIS — Z6825 Body mass index (BMI) 25.0-25.9, adult: Secondary | ICD-10-CM | POA: Diagnosis not present

## 2016-10-28 DIAGNOSIS — R5383 Other fatigue: Secondary | ICD-10-CM | POA: Diagnosis not present

## 2016-10-28 DIAGNOSIS — R0989 Other specified symptoms and signs involving the circulatory and respiratory systems: Secondary | ICD-10-CM | POA: Diagnosis not present

## 2016-10-28 DIAGNOSIS — R0602 Shortness of breath: Secondary | ICD-10-CM | POA: Diagnosis not present

## 2016-11-04 DIAGNOSIS — J069 Acute upper respiratory infection, unspecified: Secondary | ICD-10-CM | POA: Diagnosis not present

## 2016-11-05 DIAGNOSIS — R278 Other lack of coordination: Secondary | ICD-10-CM | POA: Diagnosis not present

## 2016-11-05 DIAGNOSIS — M2550 Pain in unspecified joint: Secondary | ICD-10-CM | POA: Diagnosis not present

## 2016-11-05 DIAGNOSIS — M6281 Muscle weakness (generalized): Secondary | ICD-10-CM | POA: Diagnosis not present

## 2016-11-05 DIAGNOSIS — I69354 Hemiplegia and hemiparesis following cerebral infarction affecting left non-dominant side: Secondary | ICD-10-CM | POA: Diagnosis not present

## 2016-11-05 DIAGNOSIS — R293 Abnormal posture: Secondary | ICD-10-CM | POA: Diagnosis not present

## 2016-11-05 DIAGNOSIS — G20 Parkinson's disease: Secondary | ICD-10-CM | POA: Diagnosis not present

## 2016-11-05 DIAGNOSIS — R1312 Dysphagia, oropharyngeal phase: Secondary | ICD-10-CM | POA: Diagnosis not present

## 2016-11-05 DIAGNOSIS — Z79899 Other long term (current) drug therapy: Secondary | ICD-10-CM | POA: Diagnosis not present

## 2016-11-05 DIAGNOSIS — M25622 Stiffness of left elbow, not elsewhere classified: Secondary | ICD-10-CM | POA: Diagnosis not present

## 2016-11-05 DIAGNOSIS — Z9181 History of falling: Secondary | ICD-10-CM | POA: Diagnosis not present

## 2016-11-05 DIAGNOSIS — M25522 Pain in left elbow: Secondary | ICD-10-CM | POA: Diagnosis not present

## 2016-11-05 DIAGNOSIS — M25642 Stiffness of left hand, not elsewhere classified: Secondary | ICD-10-CM | POA: Diagnosis not present

## 2016-11-05 DIAGNOSIS — I5022 Chronic systolic (congestive) heart failure: Secondary | ICD-10-CM | POA: Diagnosis not present

## 2016-11-05 DIAGNOSIS — S72012D Unspecified intracapsular fracture of left femur, subsequent encounter for closed fracture with routine healing: Secondary | ICD-10-CM | POA: Diagnosis not present

## 2016-11-06 DIAGNOSIS — S72012D Unspecified intracapsular fracture of left femur, subsequent encounter for closed fracture with routine healing: Secondary | ICD-10-CM | POA: Diagnosis not present

## 2016-11-06 DIAGNOSIS — G20 Parkinson's disease: Secondary | ICD-10-CM | POA: Diagnosis not present

## 2016-11-06 DIAGNOSIS — I69354 Hemiplegia and hemiparesis following cerebral infarction affecting left non-dominant side: Secondary | ICD-10-CM | POA: Diagnosis not present

## 2016-11-06 DIAGNOSIS — R278 Other lack of coordination: Secondary | ICD-10-CM | POA: Diagnosis not present

## 2016-11-06 DIAGNOSIS — Z9181 History of falling: Secondary | ICD-10-CM | POA: Diagnosis not present

## 2016-11-06 DIAGNOSIS — M6281 Muscle weakness (generalized): Secondary | ICD-10-CM | POA: Diagnosis not present

## 2016-11-07 DIAGNOSIS — S72012D Unspecified intracapsular fracture of left femur, subsequent encounter for closed fracture with routine healing: Secondary | ICD-10-CM | POA: Diagnosis not present

## 2016-11-07 DIAGNOSIS — R278 Other lack of coordination: Secondary | ICD-10-CM | POA: Diagnosis not present

## 2016-11-07 DIAGNOSIS — I69354 Hemiplegia and hemiparesis following cerebral infarction affecting left non-dominant side: Secondary | ICD-10-CM | POA: Diagnosis not present

## 2016-11-07 DIAGNOSIS — G20 Parkinson's disease: Secondary | ICD-10-CM | POA: Diagnosis not present

## 2016-11-07 DIAGNOSIS — Z9181 History of falling: Secondary | ICD-10-CM | POA: Diagnosis not present

## 2016-11-07 DIAGNOSIS — M6281 Muscle weakness (generalized): Secondary | ICD-10-CM | POA: Diagnosis not present

## 2016-11-08 DIAGNOSIS — I69354 Hemiplegia and hemiparesis following cerebral infarction affecting left non-dominant side: Secondary | ICD-10-CM | POA: Diagnosis not present

## 2016-11-08 DIAGNOSIS — M6281 Muscle weakness (generalized): Secondary | ICD-10-CM | POA: Diagnosis not present

## 2016-11-08 DIAGNOSIS — Z9181 History of falling: Secondary | ICD-10-CM | POA: Diagnosis not present

## 2016-11-08 DIAGNOSIS — S72012D Unspecified intracapsular fracture of left femur, subsequent encounter for closed fracture with routine healing: Secondary | ICD-10-CM | POA: Diagnosis not present

## 2016-11-08 DIAGNOSIS — G20 Parkinson's disease: Secondary | ICD-10-CM | POA: Diagnosis not present

## 2016-11-08 DIAGNOSIS — R278 Other lack of coordination: Secondary | ICD-10-CM | POA: Diagnosis not present

## 2016-11-11 DIAGNOSIS — Z9181 History of falling: Secondary | ICD-10-CM | POA: Diagnosis not present

## 2016-11-11 DIAGNOSIS — I69354 Hemiplegia and hemiparesis following cerebral infarction affecting left non-dominant side: Secondary | ICD-10-CM | POA: Diagnosis not present

## 2016-11-11 DIAGNOSIS — M25642 Stiffness of left hand, not elsewhere classified: Secondary | ICD-10-CM | POA: Diagnosis not present

## 2016-11-11 DIAGNOSIS — M25622 Stiffness of left elbow, not elsewhere classified: Secondary | ICD-10-CM | POA: Diagnosis not present

## 2016-11-11 DIAGNOSIS — M2550 Pain in unspecified joint: Secondary | ICD-10-CM | POA: Diagnosis not present

## 2016-11-11 DIAGNOSIS — M6281 Muscle weakness (generalized): Secondary | ICD-10-CM | POA: Diagnosis not present

## 2016-11-11 DIAGNOSIS — R278 Other lack of coordination: Secondary | ICD-10-CM | POA: Diagnosis not present

## 2016-11-11 DIAGNOSIS — R293 Abnormal posture: Secondary | ICD-10-CM | POA: Diagnosis not present

## 2016-11-11 DIAGNOSIS — R1312 Dysphagia, oropharyngeal phase: Secondary | ICD-10-CM | POA: Diagnosis not present

## 2016-11-11 DIAGNOSIS — S72012D Unspecified intracapsular fracture of left femur, subsequent encounter for closed fracture with routine healing: Secondary | ICD-10-CM | POA: Diagnosis not present

## 2016-11-11 DIAGNOSIS — G20 Parkinson's disease: Secondary | ICD-10-CM | POA: Diagnosis not present

## 2016-11-11 DIAGNOSIS — M25522 Pain in left elbow: Secondary | ICD-10-CM | POA: Diagnosis not present

## 2016-11-12 DIAGNOSIS — M6281 Muscle weakness (generalized): Secondary | ICD-10-CM | POA: Diagnosis not present

## 2016-11-12 DIAGNOSIS — R278 Other lack of coordination: Secondary | ICD-10-CM | POA: Diagnosis not present

## 2016-11-12 DIAGNOSIS — I69354 Hemiplegia and hemiparesis following cerebral infarction affecting left non-dominant side: Secondary | ICD-10-CM | POA: Diagnosis not present

## 2016-11-12 DIAGNOSIS — Z9181 History of falling: Secondary | ICD-10-CM | POA: Diagnosis not present

## 2016-11-12 DIAGNOSIS — G20 Parkinson's disease: Secondary | ICD-10-CM | POA: Diagnosis not present

## 2016-11-12 DIAGNOSIS — S72012D Unspecified intracapsular fracture of left femur, subsequent encounter for closed fracture with routine healing: Secondary | ICD-10-CM | POA: Diagnosis not present

## 2016-11-13 DIAGNOSIS — M6281 Muscle weakness (generalized): Secondary | ICD-10-CM | POA: Diagnosis not present

## 2016-11-13 DIAGNOSIS — S72012D Unspecified intracapsular fracture of left femur, subsequent encounter for closed fracture with routine healing: Secondary | ICD-10-CM | POA: Diagnosis not present

## 2016-11-13 DIAGNOSIS — Z7982 Long term (current) use of aspirin: Secondary | ICD-10-CM | POA: Diagnosis not present

## 2016-11-13 DIAGNOSIS — I6930 Unspecified sequelae of cerebral infarction: Secondary | ICD-10-CM | POA: Diagnosis not present

## 2016-11-13 DIAGNOSIS — Z7401 Bed confinement status: Secondary | ICD-10-CM | POA: Diagnosis not present

## 2016-11-13 DIAGNOSIS — E785 Hyperlipidemia, unspecified: Secondary | ICD-10-CM | POA: Diagnosis not present

## 2016-11-13 DIAGNOSIS — I2510 Atherosclerotic heart disease of native coronary artery without angina pectoris: Secondary | ICD-10-CM | POA: Diagnosis not present

## 2016-11-13 DIAGNOSIS — Z8673 Personal history of transient ischemic attack (TIA), and cerebral infarction without residual deficits: Secondary | ICD-10-CM | POA: Diagnosis not present

## 2016-11-13 DIAGNOSIS — I252 Old myocardial infarction: Secondary | ICD-10-CM | POA: Diagnosis not present

## 2016-11-13 DIAGNOSIS — Z9181 History of falling: Secondary | ICD-10-CM | POA: Diagnosis not present

## 2016-11-13 DIAGNOSIS — I69354 Hemiplegia and hemiparesis following cerebral infarction affecting left non-dominant side: Secondary | ICD-10-CM | POA: Diagnosis not present

## 2016-11-13 DIAGNOSIS — Z955 Presence of coronary angioplasty implant and graft: Secondary | ICD-10-CM | POA: Diagnosis not present

## 2016-11-13 DIAGNOSIS — G20 Parkinson's disease: Secondary | ICD-10-CM | POA: Diagnosis not present

## 2016-11-13 DIAGNOSIS — I10 Essential (primary) hypertension: Secondary | ICD-10-CM | POA: Diagnosis not present

## 2016-11-13 DIAGNOSIS — R278 Other lack of coordination: Secondary | ICD-10-CM | POA: Diagnosis not present

## 2016-11-13 DIAGNOSIS — R279 Unspecified lack of coordination: Secondary | ICD-10-CM | POA: Diagnosis not present

## 2016-11-14 DIAGNOSIS — R278 Other lack of coordination: Secondary | ICD-10-CM | POA: Diagnosis not present

## 2016-11-14 DIAGNOSIS — M6281 Muscle weakness (generalized): Secondary | ICD-10-CM | POA: Diagnosis not present

## 2016-11-14 DIAGNOSIS — Z9181 History of falling: Secondary | ICD-10-CM | POA: Diagnosis not present

## 2016-11-14 DIAGNOSIS — S72012D Unspecified intracapsular fracture of left femur, subsequent encounter for closed fracture with routine healing: Secondary | ICD-10-CM | POA: Diagnosis not present

## 2016-11-14 DIAGNOSIS — G20 Parkinson's disease: Secondary | ICD-10-CM | POA: Diagnosis not present

## 2016-11-14 DIAGNOSIS — I69354 Hemiplegia and hemiparesis following cerebral infarction affecting left non-dominant side: Secondary | ICD-10-CM | POA: Diagnosis not present

## 2016-11-15 DIAGNOSIS — R278 Other lack of coordination: Secondary | ICD-10-CM | POA: Diagnosis not present

## 2016-11-15 DIAGNOSIS — Z9181 History of falling: Secondary | ICD-10-CM | POA: Diagnosis not present

## 2016-11-15 DIAGNOSIS — G20 Parkinson's disease: Secondary | ICD-10-CM | POA: Diagnosis not present

## 2016-11-15 DIAGNOSIS — I69354 Hemiplegia and hemiparesis following cerebral infarction affecting left non-dominant side: Secondary | ICD-10-CM | POA: Diagnosis not present

## 2016-11-15 DIAGNOSIS — S72012D Unspecified intracapsular fracture of left femur, subsequent encounter for closed fracture with routine healing: Secondary | ICD-10-CM | POA: Diagnosis not present

## 2016-11-15 DIAGNOSIS — M6281 Muscle weakness (generalized): Secondary | ICD-10-CM | POA: Diagnosis not present

## 2016-11-15 DIAGNOSIS — I10 Essential (primary) hypertension: Secondary | ICD-10-CM | POA: Diagnosis not present

## 2016-11-18 DIAGNOSIS — Z9181 History of falling: Secondary | ICD-10-CM | POA: Diagnosis not present

## 2016-11-18 DIAGNOSIS — R278 Other lack of coordination: Secondary | ICD-10-CM | POA: Diagnosis not present

## 2016-11-18 DIAGNOSIS — M6281 Muscle weakness (generalized): Secondary | ICD-10-CM | POA: Diagnosis not present

## 2016-11-18 DIAGNOSIS — S72012D Unspecified intracapsular fracture of left femur, subsequent encounter for closed fracture with routine healing: Secondary | ICD-10-CM | POA: Diagnosis not present

## 2016-11-18 DIAGNOSIS — G20 Parkinson's disease: Secondary | ICD-10-CM | POA: Diagnosis not present

## 2016-11-18 DIAGNOSIS — I69354 Hemiplegia and hemiparesis following cerebral infarction affecting left non-dominant side: Secondary | ICD-10-CM | POA: Diagnosis not present

## 2016-11-19 DIAGNOSIS — M6281 Muscle weakness (generalized): Secondary | ICD-10-CM | POA: Diagnosis not present

## 2016-11-19 DIAGNOSIS — Z9181 History of falling: Secondary | ICD-10-CM | POA: Diagnosis not present

## 2016-11-19 DIAGNOSIS — S72012D Unspecified intracapsular fracture of left femur, subsequent encounter for closed fracture with routine healing: Secondary | ICD-10-CM | POA: Diagnosis not present

## 2016-11-19 DIAGNOSIS — I69354 Hemiplegia and hemiparesis following cerebral infarction affecting left non-dominant side: Secondary | ICD-10-CM | POA: Diagnosis not present

## 2016-11-19 DIAGNOSIS — R278 Other lack of coordination: Secondary | ICD-10-CM | POA: Diagnosis not present

## 2016-11-19 DIAGNOSIS — G20 Parkinson's disease: Secondary | ICD-10-CM | POA: Diagnosis not present

## 2016-11-20 DIAGNOSIS — R278 Other lack of coordination: Secondary | ICD-10-CM | POA: Diagnosis not present

## 2016-11-20 DIAGNOSIS — Z9181 History of falling: Secondary | ICD-10-CM | POA: Diagnosis not present

## 2016-11-20 DIAGNOSIS — S72012D Unspecified intracapsular fracture of left femur, subsequent encounter for closed fracture with routine healing: Secondary | ICD-10-CM | POA: Diagnosis not present

## 2016-11-20 DIAGNOSIS — G20 Parkinson's disease: Secondary | ICD-10-CM | POA: Diagnosis not present

## 2016-11-20 DIAGNOSIS — M6281 Muscle weakness (generalized): Secondary | ICD-10-CM | POA: Diagnosis not present

## 2016-11-20 DIAGNOSIS — I69354 Hemiplegia and hemiparesis following cerebral infarction affecting left non-dominant side: Secondary | ICD-10-CM | POA: Diagnosis not present

## 2016-11-21 DIAGNOSIS — G20 Parkinson's disease: Secondary | ICD-10-CM | POA: Diagnosis not present

## 2016-11-21 DIAGNOSIS — S72012D Unspecified intracapsular fracture of left femur, subsequent encounter for closed fracture with routine healing: Secondary | ICD-10-CM | POA: Diagnosis not present

## 2016-11-21 DIAGNOSIS — I69354 Hemiplegia and hemiparesis following cerebral infarction affecting left non-dominant side: Secondary | ICD-10-CM | POA: Diagnosis not present

## 2016-11-21 DIAGNOSIS — M6281 Muscle weakness (generalized): Secondary | ICD-10-CM | POA: Diagnosis not present

## 2016-11-21 DIAGNOSIS — R278 Other lack of coordination: Secondary | ICD-10-CM | POA: Diagnosis not present

## 2016-11-21 DIAGNOSIS — Z9181 History of falling: Secondary | ICD-10-CM | POA: Diagnosis not present

## 2016-11-22 DIAGNOSIS — M6281 Muscle weakness (generalized): Secondary | ICD-10-CM | POA: Diagnosis not present

## 2016-11-22 DIAGNOSIS — G20 Parkinson's disease: Secondary | ICD-10-CM | POA: Diagnosis not present

## 2016-11-22 DIAGNOSIS — I69354 Hemiplegia and hemiparesis following cerebral infarction affecting left non-dominant side: Secondary | ICD-10-CM | POA: Diagnosis not present

## 2016-11-22 DIAGNOSIS — S72012D Unspecified intracapsular fracture of left femur, subsequent encounter for closed fracture with routine healing: Secondary | ICD-10-CM | POA: Diagnosis not present

## 2016-11-22 DIAGNOSIS — R278 Other lack of coordination: Secondary | ICD-10-CM | POA: Diagnosis not present

## 2016-11-22 DIAGNOSIS — Z9181 History of falling: Secondary | ICD-10-CM | POA: Diagnosis not present

## 2016-11-25 DIAGNOSIS — G20 Parkinson's disease: Secondary | ICD-10-CM | POA: Diagnosis not present

## 2016-11-25 DIAGNOSIS — S72012D Unspecified intracapsular fracture of left femur, subsequent encounter for closed fracture with routine healing: Secondary | ICD-10-CM | POA: Diagnosis not present

## 2016-11-25 DIAGNOSIS — I69354 Hemiplegia and hemiparesis following cerebral infarction affecting left non-dominant side: Secondary | ICD-10-CM | POA: Diagnosis not present

## 2016-11-25 DIAGNOSIS — R278 Other lack of coordination: Secondary | ICD-10-CM | POA: Diagnosis not present

## 2016-11-25 DIAGNOSIS — M6281 Muscle weakness (generalized): Secondary | ICD-10-CM | POA: Diagnosis not present

## 2016-11-25 DIAGNOSIS — Z9181 History of falling: Secondary | ICD-10-CM | POA: Diagnosis not present

## 2016-11-27 DIAGNOSIS — Z139 Encounter for screening, unspecified: Secondary | ICD-10-CM | POA: Diagnosis not present

## 2016-11-27 DIAGNOSIS — S72002A Fracture of unspecified part of neck of left femur, initial encounter for closed fracture: Secondary | ICD-10-CM | POA: Diagnosis not present

## 2016-11-27 DIAGNOSIS — Z9181 History of falling: Secondary | ICD-10-CM | POA: Diagnosis not present

## 2016-11-27 DIAGNOSIS — Z0000 Encounter for general adult medical examination without abnormal findings: Secondary | ICD-10-CM | POA: Diagnosis not present

## 2016-12-03 DIAGNOSIS — G4700 Insomnia, unspecified: Secondary | ICD-10-CM | POA: Diagnosis not present

## 2016-12-03 DIAGNOSIS — F323 Major depressive disorder, single episode, severe with psychotic features: Secondary | ICD-10-CM | POA: Diagnosis not present

## 2016-12-09 DIAGNOSIS — G2581 Restless legs syndrome: Secondary | ICD-10-CM | POA: Diagnosis not present

## 2016-12-09 DIAGNOSIS — R300 Dysuria: Secondary | ICD-10-CM | POA: Diagnosis not present

## 2016-12-10 DIAGNOSIS — N390 Urinary tract infection, site not specified: Secondary | ICD-10-CM | POA: Diagnosis not present

## 2016-12-17 DIAGNOSIS — G20 Parkinson's disease: Secondary | ICD-10-CM | POA: Diagnosis not present

## 2016-12-17 DIAGNOSIS — F323 Major depressive disorder, single episode, severe with psychotic features: Secondary | ICD-10-CM | POA: Diagnosis not present

## 2016-12-17 DIAGNOSIS — F0280 Dementia in other diseases classified elsewhere without behavioral disturbance: Secondary | ICD-10-CM | POA: Diagnosis not present

## 2016-12-17 DIAGNOSIS — G4700 Insomnia, unspecified: Secondary | ICD-10-CM | POA: Diagnosis not present

## 2017-01-21 DIAGNOSIS — M25642 Stiffness of left hand, not elsewhere classified: Secondary | ICD-10-CM | POA: Diagnosis not present

## 2017-01-21 DIAGNOSIS — G20 Parkinson's disease: Secondary | ICD-10-CM | POA: Diagnosis not present

## 2017-01-21 DIAGNOSIS — R278 Other lack of coordination: Secondary | ICD-10-CM | POA: Diagnosis not present

## 2017-01-21 DIAGNOSIS — M6281 Muscle weakness (generalized): Secondary | ICD-10-CM | POA: Diagnosis not present

## 2017-01-21 DIAGNOSIS — R293 Abnormal posture: Secondary | ICD-10-CM | POA: Diagnosis not present

## 2017-01-21 DIAGNOSIS — R1312 Dysphagia, oropharyngeal phase: Secondary | ICD-10-CM | POA: Diagnosis not present

## 2017-01-21 DIAGNOSIS — I69354 Hemiplegia and hemiparesis following cerebral infarction affecting left non-dominant side: Secondary | ICD-10-CM | POA: Diagnosis not present

## 2017-01-21 DIAGNOSIS — M25522 Pain in left elbow: Secondary | ICD-10-CM | POA: Diagnosis not present

## 2017-01-21 DIAGNOSIS — M25622 Stiffness of left elbow, not elsewhere classified: Secondary | ICD-10-CM | POA: Diagnosis not present

## 2017-01-21 DIAGNOSIS — I69022 Dysarthria following nontraumatic subarachnoid hemorrhage: Secondary | ICD-10-CM | POA: Diagnosis not present

## 2017-01-21 DIAGNOSIS — Z9181 History of falling: Secondary | ICD-10-CM | POA: Diagnosis not present

## 2017-01-21 DIAGNOSIS — R1319 Other dysphagia: Secondary | ICD-10-CM | POA: Diagnosis not present

## 2017-01-21 DIAGNOSIS — I10 Essential (primary) hypertension: Secondary | ICD-10-CM | POA: Diagnosis not present

## 2017-01-21 DIAGNOSIS — S72012D Unspecified intracapsular fracture of left femur, subsequent encounter for closed fracture with routine healing: Secondary | ICD-10-CM | POA: Diagnosis not present

## 2017-01-21 DIAGNOSIS — M2550 Pain in unspecified joint: Secondary | ICD-10-CM | POA: Diagnosis not present

## 2017-01-27 DIAGNOSIS — E119 Type 2 diabetes mellitus without complications: Secondary | ICD-10-CM | POA: Diagnosis not present

## 2017-01-27 DIAGNOSIS — G20 Parkinson's disease: Secondary | ICD-10-CM | POA: Diagnosis not present

## 2017-01-27 DIAGNOSIS — E785 Hyperlipidemia, unspecified: Secondary | ICD-10-CM | POA: Diagnosis not present

## 2017-01-27 DIAGNOSIS — I69354 Hemiplegia and hemiparesis following cerebral infarction affecting left non-dominant side: Secondary | ICD-10-CM | POA: Diagnosis not present

## 2017-01-27 DIAGNOSIS — I2510 Atherosclerotic heart disease of native coronary artery without angina pectoris: Secondary | ICD-10-CM | POA: Diagnosis not present

## 2017-01-27 DIAGNOSIS — Z79899 Other long term (current) drug therapy: Secondary | ICD-10-CM | POA: Diagnosis not present

## 2017-01-28 DIAGNOSIS — F0280 Dementia in other diseases classified elsewhere without behavioral disturbance: Secondary | ICD-10-CM | POA: Diagnosis not present

## 2017-01-28 DIAGNOSIS — F323 Major depressive disorder, single episode, severe with psychotic features: Secondary | ICD-10-CM | POA: Diagnosis not present

## 2017-01-28 DIAGNOSIS — G4700 Insomnia, unspecified: Secondary | ICD-10-CM | POA: Diagnosis not present

## 2017-01-31 DIAGNOSIS — Z79899 Other long term (current) drug therapy: Secondary | ICD-10-CM | POA: Diagnosis not present

## 2017-02-14 DIAGNOSIS — G20 Parkinson's disease: Secondary | ICD-10-CM | POA: Diagnosis not present

## 2017-03-11 DIAGNOSIS — F0280 Dementia in other diseases classified elsewhere without behavioral disturbance: Secondary | ICD-10-CM | POA: Diagnosis not present

## 2017-03-11 DIAGNOSIS — G20 Parkinson's disease: Secondary | ICD-10-CM | POA: Diagnosis not present

## 2017-03-11 DIAGNOSIS — G4700 Insomnia, unspecified: Secondary | ICD-10-CM | POA: Diagnosis not present

## 2017-03-11 DIAGNOSIS — F323 Major depressive disorder, single episode, severe with psychotic features: Secondary | ICD-10-CM | POA: Diagnosis not present

## 2017-03-12 DIAGNOSIS — E1159 Type 2 diabetes mellitus with other circulatory complications: Secondary | ICD-10-CM | POA: Diagnosis not present

## 2017-03-12 DIAGNOSIS — G20 Parkinson's disease: Secondary | ICD-10-CM | POA: Diagnosis not present

## 2017-03-12 DIAGNOSIS — E119 Type 2 diabetes mellitus without complications: Secondary | ICD-10-CM | POA: Diagnosis not present

## 2017-03-12 DIAGNOSIS — I69354 Hemiplegia and hemiparesis following cerebral infarction affecting left non-dominant side: Secondary | ICD-10-CM | POA: Diagnosis not present

## 2017-03-20 DIAGNOSIS — M25622 Stiffness of left elbow, not elsewhere classified: Secondary | ICD-10-CM | POA: Diagnosis not present

## 2017-03-20 DIAGNOSIS — M25522 Pain in left elbow: Secondary | ICD-10-CM | POA: Diagnosis not present

## 2017-03-20 DIAGNOSIS — S72012D Unspecified intracapsular fracture of left femur, subsequent encounter for closed fracture with routine healing: Secondary | ICD-10-CM | POA: Diagnosis not present

## 2017-03-20 DIAGNOSIS — Z9181 History of falling: Secondary | ICD-10-CM | POA: Diagnosis not present

## 2017-03-20 DIAGNOSIS — G20 Parkinson's disease: Secondary | ICD-10-CM | POA: Diagnosis not present

## 2017-03-20 DIAGNOSIS — R293 Abnormal posture: Secondary | ICD-10-CM | POA: Diagnosis not present

## 2017-03-20 DIAGNOSIS — M2550 Pain in unspecified joint: Secondary | ICD-10-CM | POA: Diagnosis not present

## 2017-03-20 DIAGNOSIS — I69022 Dysarthria following nontraumatic subarachnoid hemorrhage: Secondary | ICD-10-CM | POA: Diagnosis not present

## 2017-03-20 DIAGNOSIS — I10 Essential (primary) hypertension: Secondary | ICD-10-CM | POA: Diagnosis not present

## 2017-03-20 DIAGNOSIS — I69354 Hemiplegia and hemiparesis following cerebral infarction affecting left non-dominant side: Secondary | ICD-10-CM | POA: Diagnosis not present

## 2017-03-20 DIAGNOSIS — R1312 Dysphagia, oropharyngeal phase: Secondary | ICD-10-CM | POA: Diagnosis not present

## 2017-03-20 DIAGNOSIS — M25642 Stiffness of left hand, not elsewhere classified: Secondary | ICD-10-CM | POA: Diagnosis not present

## 2017-03-20 DIAGNOSIS — R278 Other lack of coordination: Secondary | ICD-10-CM | POA: Diagnosis not present

## 2017-03-20 DIAGNOSIS — M6281 Muscle weakness (generalized): Secondary | ICD-10-CM | POA: Diagnosis not present

## 2017-03-20 DIAGNOSIS — R1319 Other dysphagia: Secondary | ICD-10-CM | POA: Diagnosis not present

## 2017-03-25 DIAGNOSIS — G20 Parkinson's disease: Secondary | ICD-10-CM | POA: Diagnosis not present

## 2017-03-25 DIAGNOSIS — I69354 Hemiplegia and hemiparesis following cerebral infarction affecting left non-dominant side: Secondary | ICD-10-CM | POA: Diagnosis not present

## 2017-03-25 DIAGNOSIS — Z9181 History of falling: Secondary | ICD-10-CM | POA: Diagnosis not present

## 2017-03-25 DIAGNOSIS — S72012D Unspecified intracapsular fracture of left femur, subsequent encounter for closed fracture with routine healing: Secondary | ICD-10-CM | POA: Diagnosis not present

## 2017-03-25 DIAGNOSIS — R278 Other lack of coordination: Secondary | ICD-10-CM | POA: Diagnosis not present

## 2017-03-25 DIAGNOSIS — M6281 Muscle weakness (generalized): Secondary | ICD-10-CM | POA: Diagnosis not present

## 2017-03-26 DIAGNOSIS — G20 Parkinson's disease: Secondary | ICD-10-CM | POA: Diagnosis not present

## 2017-03-26 DIAGNOSIS — I69354 Hemiplegia and hemiparesis following cerebral infarction affecting left non-dominant side: Secondary | ICD-10-CM | POA: Diagnosis not present

## 2017-03-26 DIAGNOSIS — R278 Other lack of coordination: Secondary | ICD-10-CM | POA: Diagnosis not present

## 2017-03-26 DIAGNOSIS — Z9181 History of falling: Secondary | ICD-10-CM | POA: Diagnosis not present

## 2017-03-26 DIAGNOSIS — M6281 Muscle weakness (generalized): Secondary | ICD-10-CM | POA: Diagnosis not present

## 2017-03-26 DIAGNOSIS — S72012D Unspecified intracapsular fracture of left femur, subsequent encounter for closed fracture with routine healing: Secondary | ICD-10-CM | POA: Diagnosis not present

## 2017-03-27 DIAGNOSIS — M6281 Muscle weakness (generalized): Secondary | ICD-10-CM | POA: Diagnosis not present

## 2017-03-27 DIAGNOSIS — I69354 Hemiplegia and hemiparesis following cerebral infarction affecting left non-dominant side: Secondary | ICD-10-CM | POA: Diagnosis not present

## 2017-03-27 DIAGNOSIS — R278 Other lack of coordination: Secondary | ICD-10-CM | POA: Diagnosis not present

## 2017-03-27 DIAGNOSIS — S72012D Unspecified intracapsular fracture of left femur, subsequent encounter for closed fracture with routine healing: Secondary | ICD-10-CM | POA: Diagnosis not present

## 2017-03-27 DIAGNOSIS — Z9181 History of falling: Secondary | ICD-10-CM | POA: Diagnosis not present

## 2017-03-27 DIAGNOSIS — G20 Parkinson's disease: Secondary | ICD-10-CM | POA: Diagnosis not present

## 2017-03-28 DIAGNOSIS — R278 Other lack of coordination: Secondary | ICD-10-CM | POA: Diagnosis not present

## 2017-03-28 DIAGNOSIS — M6281 Muscle weakness (generalized): Secondary | ICD-10-CM | POA: Diagnosis not present

## 2017-03-28 DIAGNOSIS — S72012D Unspecified intracapsular fracture of left femur, subsequent encounter for closed fracture with routine healing: Secondary | ICD-10-CM | POA: Diagnosis not present

## 2017-03-28 DIAGNOSIS — Z9181 History of falling: Secondary | ICD-10-CM | POA: Diagnosis not present

## 2017-03-28 DIAGNOSIS — G20 Parkinson's disease: Secondary | ICD-10-CM | POA: Diagnosis not present

## 2017-03-28 DIAGNOSIS — I69354 Hemiplegia and hemiparesis following cerebral infarction affecting left non-dominant side: Secondary | ICD-10-CM | POA: Diagnosis not present

## 2017-03-31 DIAGNOSIS — I69354 Hemiplegia and hemiparesis following cerebral infarction affecting left non-dominant side: Secondary | ICD-10-CM | POA: Diagnosis not present

## 2017-03-31 DIAGNOSIS — G20 Parkinson's disease: Secondary | ICD-10-CM | POA: Diagnosis not present

## 2017-03-31 DIAGNOSIS — R278 Other lack of coordination: Secondary | ICD-10-CM | POA: Diagnosis not present

## 2017-03-31 DIAGNOSIS — M6281 Muscle weakness (generalized): Secondary | ICD-10-CM | POA: Diagnosis not present

## 2017-03-31 DIAGNOSIS — S72012D Unspecified intracapsular fracture of left femur, subsequent encounter for closed fracture with routine healing: Secondary | ICD-10-CM | POA: Diagnosis not present

## 2017-03-31 DIAGNOSIS — Z9181 History of falling: Secondary | ICD-10-CM | POA: Diagnosis not present

## 2017-04-03 DIAGNOSIS — G20 Parkinson's disease: Secondary | ICD-10-CM | POA: Diagnosis not present

## 2017-04-03 DIAGNOSIS — S72012D Unspecified intracapsular fracture of left femur, subsequent encounter for closed fracture with routine healing: Secondary | ICD-10-CM | POA: Diagnosis not present

## 2017-04-03 DIAGNOSIS — M6281 Muscle weakness (generalized): Secondary | ICD-10-CM | POA: Diagnosis not present

## 2017-04-03 DIAGNOSIS — Z9181 History of falling: Secondary | ICD-10-CM | POA: Diagnosis not present

## 2017-04-03 DIAGNOSIS — R278 Other lack of coordination: Secondary | ICD-10-CM | POA: Diagnosis not present

## 2017-04-03 DIAGNOSIS — I69354 Hemiplegia and hemiparesis following cerebral infarction affecting left non-dominant side: Secondary | ICD-10-CM | POA: Diagnosis not present

## 2017-04-04 DIAGNOSIS — Z79899 Other long term (current) drug therapy: Secondary | ICD-10-CM | POA: Diagnosis not present

## 2017-04-07 DIAGNOSIS — S72012D Unspecified intracapsular fracture of left femur, subsequent encounter for closed fracture with routine healing: Secondary | ICD-10-CM | POA: Diagnosis not present

## 2017-04-07 DIAGNOSIS — I69354 Hemiplegia and hemiparesis following cerebral infarction affecting left non-dominant side: Secondary | ICD-10-CM | POA: Diagnosis not present

## 2017-04-07 DIAGNOSIS — M6281 Muscle weakness (generalized): Secondary | ICD-10-CM | POA: Diagnosis not present

## 2017-04-07 DIAGNOSIS — Z9181 History of falling: Secondary | ICD-10-CM | POA: Diagnosis not present

## 2017-04-07 DIAGNOSIS — G20 Parkinson's disease: Secondary | ICD-10-CM | POA: Diagnosis not present

## 2017-04-07 DIAGNOSIS — I10 Essential (primary) hypertension: Secondary | ICD-10-CM | POA: Diagnosis not present

## 2017-04-07 DIAGNOSIS — E1159 Type 2 diabetes mellitus with other circulatory complications: Secondary | ICD-10-CM | POA: Diagnosis not present

## 2017-04-07 DIAGNOSIS — R278 Other lack of coordination: Secondary | ICD-10-CM | POA: Diagnosis not present

## 2017-04-09 DIAGNOSIS — S72012D Unspecified intracapsular fracture of left femur, subsequent encounter for closed fracture with routine healing: Secondary | ICD-10-CM | POA: Diagnosis not present

## 2017-04-09 DIAGNOSIS — M6281 Muscle weakness (generalized): Secondary | ICD-10-CM | POA: Diagnosis not present

## 2017-04-09 DIAGNOSIS — G20 Parkinson's disease: Secondary | ICD-10-CM | POA: Diagnosis not present

## 2017-04-09 DIAGNOSIS — I69354 Hemiplegia and hemiparesis following cerebral infarction affecting left non-dominant side: Secondary | ICD-10-CM | POA: Diagnosis not present

## 2017-04-09 DIAGNOSIS — Z9181 History of falling: Secondary | ICD-10-CM | POA: Diagnosis not present

## 2017-04-09 DIAGNOSIS — R278 Other lack of coordination: Secondary | ICD-10-CM | POA: Diagnosis not present

## 2017-04-11 DIAGNOSIS — R293 Abnormal posture: Secondary | ICD-10-CM | POA: Diagnosis not present

## 2017-04-11 DIAGNOSIS — S72012D Unspecified intracapsular fracture of left femur, subsequent encounter for closed fracture with routine healing: Secondary | ICD-10-CM | POA: Diagnosis not present

## 2017-04-11 DIAGNOSIS — G20 Parkinson's disease: Secondary | ICD-10-CM | POA: Diagnosis not present

## 2017-04-11 DIAGNOSIS — I69354 Hemiplegia and hemiparesis following cerebral infarction affecting left non-dominant side: Secondary | ICD-10-CM | POA: Diagnosis not present

## 2017-04-11 DIAGNOSIS — Z9181 History of falling: Secondary | ICD-10-CM | POA: Diagnosis not present

## 2017-04-11 DIAGNOSIS — I10 Essential (primary) hypertension: Secondary | ICD-10-CM | POA: Diagnosis not present

## 2017-04-11 DIAGNOSIS — M25642 Stiffness of left hand, not elsewhere classified: Secondary | ICD-10-CM | POA: Diagnosis not present

## 2017-04-11 DIAGNOSIS — M2550 Pain in unspecified joint: Secondary | ICD-10-CM | POA: Diagnosis not present

## 2017-04-11 DIAGNOSIS — R1312 Dysphagia, oropharyngeal phase: Secondary | ICD-10-CM | POA: Diagnosis not present

## 2017-04-11 DIAGNOSIS — R278 Other lack of coordination: Secondary | ICD-10-CM | POA: Diagnosis not present

## 2017-04-11 DIAGNOSIS — I69022 Dysarthria following nontraumatic subarachnoid hemorrhage: Secondary | ICD-10-CM | POA: Diagnosis not present

## 2017-04-11 DIAGNOSIS — M25522 Pain in left elbow: Secondary | ICD-10-CM | POA: Diagnosis not present

## 2017-04-11 DIAGNOSIS — M6281 Muscle weakness (generalized): Secondary | ICD-10-CM | POA: Diagnosis not present

## 2017-04-11 DIAGNOSIS — M25622 Stiffness of left elbow, not elsewhere classified: Secondary | ICD-10-CM | POA: Diagnosis not present

## 2017-04-11 DIAGNOSIS — R1319 Other dysphagia: Secondary | ICD-10-CM | POA: Diagnosis not present

## 2017-04-14 DIAGNOSIS — M6281 Muscle weakness (generalized): Secondary | ICD-10-CM | POA: Diagnosis not present

## 2017-04-14 DIAGNOSIS — S72012D Unspecified intracapsular fracture of left femur, subsequent encounter for closed fracture with routine healing: Secondary | ICD-10-CM | POA: Diagnosis not present

## 2017-04-14 DIAGNOSIS — G20 Parkinson's disease: Secondary | ICD-10-CM | POA: Diagnosis not present

## 2017-04-14 DIAGNOSIS — Z9181 History of falling: Secondary | ICD-10-CM | POA: Diagnosis not present

## 2017-04-14 DIAGNOSIS — R278 Other lack of coordination: Secondary | ICD-10-CM | POA: Diagnosis not present

## 2017-04-14 DIAGNOSIS — I69354 Hemiplegia and hemiparesis following cerebral infarction affecting left non-dominant side: Secondary | ICD-10-CM | POA: Diagnosis not present

## 2017-04-16 DIAGNOSIS — G20 Parkinson's disease: Secondary | ICD-10-CM | POA: Diagnosis not present

## 2017-04-16 DIAGNOSIS — M6281 Muscle weakness (generalized): Secondary | ICD-10-CM | POA: Diagnosis not present

## 2017-04-16 DIAGNOSIS — S72012D Unspecified intracapsular fracture of left femur, subsequent encounter for closed fracture with routine healing: Secondary | ICD-10-CM | POA: Diagnosis not present

## 2017-04-16 DIAGNOSIS — Z9181 History of falling: Secondary | ICD-10-CM | POA: Diagnosis not present

## 2017-04-16 DIAGNOSIS — R278 Other lack of coordination: Secondary | ICD-10-CM | POA: Diagnosis not present

## 2017-04-16 DIAGNOSIS — I69354 Hemiplegia and hemiparesis following cerebral infarction affecting left non-dominant side: Secondary | ICD-10-CM | POA: Diagnosis not present

## 2017-04-18 DIAGNOSIS — Z9181 History of falling: Secondary | ICD-10-CM | POA: Diagnosis not present

## 2017-04-18 DIAGNOSIS — I69354 Hemiplegia and hemiparesis following cerebral infarction affecting left non-dominant side: Secondary | ICD-10-CM | POA: Diagnosis not present

## 2017-04-18 DIAGNOSIS — N390 Urinary tract infection, site not specified: Secondary | ICD-10-CM | POA: Diagnosis not present

## 2017-04-18 DIAGNOSIS — M6281 Muscle weakness (generalized): Secondary | ICD-10-CM | POA: Diagnosis not present

## 2017-04-18 DIAGNOSIS — S72012D Unspecified intracapsular fracture of left femur, subsequent encounter for closed fracture with routine healing: Secondary | ICD-10-CM | POA: Diagnosis not present

## 2017-04-18 DIAGNOSIS — R278 Other lack of coordination: Secondary | ICD-10-CM | POA: Diagnosis not present

## 2017-04-18 DIAGNOSIS — G20 Parkinson's disease: Secondary | ICD-10-CM | POA: Diagnosis not present

## 2017-04-21 DIAGNOSIS — Z9181 History of falling: Secondary | ICD-10-CM | POA: Diagnosis not present

## 2017-04-21 DIAGNOSIS — N390 Urinary tract infection, site not specified: Secondary | ICD-10-CM | POA: Diagnosis not present

## 2017-04-21 DIAGNOSIS — M6281 Muscle weakness (generalized): Secondary | ICD-10-CM | POA: Diagnosis not present

## 2017-04-21 DIAGNOSIS — G20 Parkinson's disease: Secondary | ICD-10-CM | POA: Diagnosis not present

## 2017-04-21 DIAGNOSIS — S72012D Unspecified intracapsular fracture of left femur, subsequent encounter for closed fracture with routine healing: Secondary | ICD-10-CM | POA: Diagnosis not present

## 2017-04-21 DIAGNOSIS — I69354 Hemiplegia and hemiparesis following cerebral infarction affecting left non-dominant side: Secondary | ICD-10-CM | POA: Diagnosis not present

## 2017-04-21 DIAGNOSIS — R278 Other lack of coordination: Secondary | ICD-10-CM | POA: Diagnosis not present

## 2017-04-23 DIAGNOSIS — G20 Parkinson's disease: Secondary | ICD-10-CM | POA: Diagnosis not present

## 2017-04-23 DIAGNOSIS — M6281 Muscle weakness (generalized): Secondary | ICD-10-CM | POA: Diagnosis not present

## 2017-04-23 DIAGNOSIS — I69354 Hemiplegia and hemiparesis following cerebral infarction affecting left non-dominant side: Secondary | ICD-10-CM | POA: Diagnosis not present

## 2017-04-23 DIAGNOSIS — S72012D Unspecified intracapsular fracture of left femur, subsequent encounter for closed fracture with routine healing: Secondary | ICD-10-CM | POA: Diagnosis not present

## 2017-04-23 DIAGNOSIS — R278 Other lack of coordination: Secondary | ICD-10-CM | POA: Diagnosis not present

## 2017-04-23 DIAGNOSIS — Z9181 History of falling: Secondary | ICD-10-CM | POA: Diagnosis not present

## 2017-04-25 DIAGNOSIS — S72012D Unspecified intracapsular fracture of left femur, subsequent encounter for closed fracture with routine healing: Secondary | ICD-10-CM | POA: Diagnosis not present

## 2017-04-25 DIAGNOSIS — R278 Other lack of coordination: Secondary | ICD-10-CM | POA: Diagnosis not present

## 2017-04-25 DIAGNOSIS — G20 Parkinson's disease: Secondary | ICD-10-CM | POA: Diagnosis not present

## 2017-04-25 DIAGNOSIS — I69354 Hemiplegia and hemiparesis following cerebral infarction affecting left non-dominant side: Secondary | ICD-10-CM | POA: Diagnosis not present

## 2017-04-25 DIAGNOSIS — Z9181 History of falling: Secondary | ICD-10-CM | POA: Diagnosis not present

## 2017-04-25 DIAGNOSIS — M6281 Muscle weakness (generalized): Secondary | ICD-10-CM | POA: Diagnosis not present

## 2017-04-28 DIAGNOSIS — Z9181 History of falling: Secondary | ICD-10-CM | POA: Diagnosis not present

## 2017-04-28 DIAGNOSIS — M6281 Muscle weakness (generalized): Secondary | ICD-10-CM | POA: Diagnosis not present

## 2017-04-28 DIAGNOSIS — S72012D Unspecified intracapsular fracture of left femur, subsequent encounter for closed fracture with routine healing: Secondary | ICD-10-CM | POA: Diagnosis not present

## 2017-04-28 DIAGNOSIS — I69354 Hemiplegia and hemiparesis following cerebral infarction affecting left non-dominant side: Secondary | ICD-10-CM | POA: Diagnosis not present

## 2017-04-28 DIAGNOSIS — R278 Other lack of coordination: Secondary | ICD-10-CM | POA: Diagnosis not present

## 2017-04-28 DIAGNOSIS — G20 Parkinson's disease: Secondary | ICD-10-CM | POA: Diagnosis not present

## 2017-04-29 DIAGNOSIS — S72012D Unspecified intracapsular fracture of left femur, subsequent encounter for closed fracture with routine healing: Secondary | ICD-10-CM | POA: Diagnosis not present

## 2017-04-29 DIAGNOSIS — F323 Major depressive disorder, single episode, severe with psychotic features: Secondary | ICD-10-CM | POA: Diagnosis not present

## 2017-04-29 DIAGNOSIS — M6281 Muscle weakness (generalized): Secondary | ICD-10-CM | POA: Diagnosis not present

## 2017-04-29 DIAGNOSIS — G4700 Insomnia, unspecified: Secondary | ICD-10-CM | POA: Diagnosis not present

## 2017-04-29 DIAGNOSIS — Z9181 History of falling: Secondary | ICD-10-CM | POA: Diagnosis not present

## 2017-04-29 DIAGNOSIS — R278 Other lack of coordination: Secondary | ICD-10-CM | POA: Diagnosis not present

## 2017-04-29 DIAGNOSIS — G20 Parkinson's disease: Secondary | ICD-10-CM | POA: Diagnosis not present

## 2017-04-29 DIAGNOSIS — I69354 Hemiplegia and hemiparesis following cerebral infarction affecting left non-dominant side: Secondary | ICD-10-CM | POA: Diagnosis not present

## 2017-04-29 DIAGNOSIS — F0280 Dementia in other diseases classified elsewhere without behavioral disturbance: Secondary | ICD-10-CM | POA: Diagnosis not present

## 2017-05-28 DIAGNOSIS — Z79899 Other long term (current) drug therapy: Secondary | ICD-10-CM | POA: Diagnosis not present

## 2017-06-09 DIAGNOSIS — G20 Parkinson's disease: Secondary | ICD-10-CM | POA: Diagnosis not present

## 2017-06-09 DIAGNOSIS — I10 Essential (primary) hypertension: Secondary | ICD-10-CM | POA: Diagnosis not present

## 2017-06-09 DIAGNOSIS — I69354 Hemiplegia and hemiparesis following cerebral infarction affecting left non-dominant side: Secondary | ICD-10-CM | POA: Diagnosis not present

## 2017-06-09 DIAGNOSIS — E1159 Type 2 diabetes mellitus with other circulatory complications: Secondary | ICD-10-CM | POA: Diagnosis not present

## 2017-06-16 DIAGNOSIS — F0280 Dementia in other diseases classified elsewhere without behavioral disturbance: Secondary | ICD-10-CM | POA: Diagnosis not present

## 2017-06-16 DIAGNOSIS — F323 Major depressive disorder, single episode, severe with psychotic features: Secondary | ICD-10-CM | POA: Diagnosis not present

## 2017-06-16 DIAGNOSIS — G4700 Insomnia, unspecified: Secondary | ICD-10-CM | POA: Diagnosis not present

## 2017-06-16 DIAGNOSIS — G20 Parkinson's disease: Secondary | ICD-10-CM | POA: Diagnosis not present

## 2017-06-23 DIAGNOSIS — E119 Type 2 diabetes mellitus without complications: Secondary | ICD-10-CM | POA: Diagnosis not present

## 2017-06-23 DIAGNOSIS — H04123 Dry eye syndrome of bilateral lacrimal glands: Secondary | ICD-10-CM | POA: Diagnosis not present

## 2017-06-23 DIAGNOSIS — Z7984 Long term (current) use of oral hypoglycemic drugs: Secondary | ICD-10-CM | POA: Diagnosis not present

## 2017-06-23 DIAGNOSIS — Z961 Presence of intraocular lens: Secondary | ICD-10-CM | POA: Diagnosis not present

## 2017-07-14 DIAGNOSIS — G4700 Insomnia, unspecified: Secondary | ICD-10-CM | POA: Diagnosis not present

## 2017-07-14 DIAGNOSIS — F323 Major depressive disorder, single episode, severe with psychotic features: Secondary | ICD-10-CM | POA: Diagnosis not present

## 2017-07-14 DIAGNOSIS — F0280 Dementia in other diseases classified elsewhere without behavioral disturbance: Secondary | ICD-10-CM | POA: Diagnosis not present

## 2017-07-15 DIAGNOSIS — N390 Urinary tract infection, site not specified: Secondary | ICD-10-CM | POA: Diagnosis not present

## 2017-07-16 DIAGNOSIS — R05 Cough: Secondary | ICD-10-CM | POA: Diagnosis not present

## 2017-07-18 DIAGNOSIS — L719 Rosacea, unspecified: Secondary | ICD-10-CM | POA: Diagnosis not present

## 2017-07-18 DIAGNOSIS — I69354 Hemiplegia and hemiparesis following cerebral infarction affecting left non-dominant side: Secondary | ICD-10-CM | POA: Diagnosis not present

## 2017-07-18 DIAGNOSIS — G20 Parkinson's disease: Secondary | ICD-10-CM | POA: Diagnosis not present

## 2017-07-18 DIAGNOSIS — I2510 Atherosclerotic heart disease of native coronary artery without angina pectoris: Secondary | ICD-10-CM | POA: Diagnosis not present

## 2017-07-27 DIAGNOSIS — R109 Unspecified abdominal pain: Secondary | ICD-10-CM | POA: Diagnosis not present

## 2017-07-28 DIAGNOSIS — Z79899 Other long term (current) drug therapy: Secondary | ICD-10-CM | POA: Diagnosis not present

## 2017-07-28 DIAGNOSIS — I5032 Chronic diastolic (congestive) heart failure: Secondary | ICD-10-CM | POA: Diagnosis not present

## 2017-07-30 DIAGNOSIS — Z79899 Other long term (current) drug therapy: Secondary | ICD-10-CM | POA: Diagnosis not present

## 2017-08-01 DIAGNOSIS — M6281 Muscle weakness (generalized): Secondary | ICD-10-CM | POA: Diagnosis not present

## 2017-08-01 DIAGNOSIS — M79622 Pain in left upper arm: Secondary | ICD-10-CM | POA: Diagnosis not present

## 2017-08-01 DIAGNOSIS — F329 Major depressive disorder, single episode, unspecified: Secondary | ICD-10-CM | POA: Diagnosis not present

## 2017-08-01 DIAGNOSIS — I69022 Dysarthria following nontraumatic subarachnoid hemorrhage: Secondary | ICD-10-CM | POA: Diagnosis not present

## 2017-08-01 DIAGNOSIS — G20 Parkinson's disease: Secondary | ICD-10-CM | POA: Diagnosis not present

## 2017-08-01 DIAGNOSIS — R278 Other lack of coordination: Secondary | ICD-10-CM | POA: Diagnosis not present

## 2017-08-01 DIAGNOSIS — R2689 Other abnormalities of gait and mobility: Secondary | ICD-10-CM | POA: Diagnosis not present

## 2017-08-01 DIAGNOSIS — I2541 Coronary artery aneurysm: Secondary | ICD-10-CM | POA: Diagnosis not present

## 2017-08-01 DIAGNOSIS — I69354 Hemiplegia and hemiparesis following cerebral infarction affecting left non-dominant side: Secondary | ICD-10-CM | POA: Diagnosis not present

## 2017-08-01 DIAGNOSIS — Z79899 Other long term (current) drug therapy: Secondary | ICD-10-CM | POA: Diagnosis not present

## 2017-08-01 DIAGNOSIS — M25612 Stiffness of left shoulder, not elsewhere classified: Secondary | ICD-10-CM | POA: Diagnosis not present

## 2017-08-01 DIAGNOSIS — S72012D Unspecified intracapsular fracture of left femur, subsequent encounter for closed fracture with routine healing: Secondary | ICD-10-CM | POA: Diagnosis not present

## 2017-08-01 DIAGNOSIS — R293 Abnormal posture: Secondary | ICD-10-CM | POA: Diagnosis not present

## 2017-08-01 DIAGNOSIS — M25622 Stiffness of left elbow, not elsewhere classified: Secondary | ICD-10-CM | POA: Diagnosis not present

## 2017-08-01 DIAGNOSIS — Z9181 History of falling: Secondary | ICD-10-CM | POA: Diagnosis not present

## 2017-08-01 DIAGNOSIS — M79632 Pain in left forearm: Secondary | ICD-10-CM | POA: Diagnosis not present

## 2017-08-02 DIAGNOSIS — M25552 Pain in left hip: Secondary | ICD-10-CM | POA: Diagnosis not present

## 2017-08-04 DIAGNOSIS — Z79899 Other long term (current) drug therapy: Secondary | ICD-10-CM | POA: Diagnosis not present

## 2017-08-05 DIAGNOSIS — S72012D Unspecified intracapsular fracture of left femur, subsequent encounter for closed fracture with routine healing: Secondary | ICD-10-CM | POA: Diagnosis not present

## 2017-08-05 DIAGNOSIS — Z9181 History of falling: Secondary | ICD-10-CM | POA: Diagnosis not present

## 2017-08-05 DIAGNOSIS — I2541 Coronary artery aneurysm: Secondary | ICD-10-CM | POA: Diagnosis not present

## 2017-08-05 DIAGNOSIS — I69354 Hemiplegia and hemiparesis following cerebral infarction affecting left non-dominant side: Secondary | ICD-10-CM | POA: Diagnosis not present

## 2017-08-05 DIAGNOSIS — G20 Parkinson's disease: Secondary | ICD-10-CM | POA: Diagnosis not present

## 2017-08-05 DIAGNOSIS — M6281 Muscle weakness (generalized): Secondary | ICD-10-CM | POA: Diagnosis not present

## 2017-08-06 DIAGNOSIS — I69354 Hemiplegia and hemiparesis following cerebral infarction affecting left non-dominant side: Secondary | ICD-10-CM | POA: Diagnosis not present

## 2017-08-06 DIAGNOSIS — M6281 Muscle weakness (generalized): Secondary | ICD-10-CM | POA: Diagnosis not present

## 2017-08-06 DIAGNOSIS — G20 Parkinson's disease: Secondary | ICD-10-CM | POA: Diagnosis not present

## 2017-08-06 DIAGNOSIS — S72012D Unspecified intracapsular fracture of left femur, subsequent encounter for closed fracture with routine healing: Secondary | ICD-10-CM | POA: Diagnosis not present

## 2017-08-06 DIAGNOSIS — Z9181 History of falling: Secondary | ICD-10-CM | POA: Diagnosis not present

## 2017-08-06 DIAGNOSIS — I2541 Coronary artery aneurysm: Secondary | ICD-10-CM | POA: Diagnosis not present

## 2017-08-08 DIAGNOSIS — M6281 Muscle weakness (generalized): Secondary | ICD-10-CM | POA: Diagnosis not present

## 2017-08-08 DIAGNOSIS — Z9181 History of falling: Secondary | ICD-10-CM | POA: Diagnosis not present

## 2017-08-08 DIAGNOSIS — G20 Parkinson's disease: Secondary | ICD-10-CM | POA: Diagnosis not present

## 2017-08-08 DIAGNOSIS — I2541 Coronary artery aneurysm: Secondary | ICD-10-CM | POA: Diagnosis not present

## 2017-08-08 DIAGNOSIS — I69354 Hemiplegia and hemiparesis following cerebral infarction affecting left non-dominant side: Secondary | ICD-10-CM | POA: Diagnosis not present

## 2017-08-08 DIAGNOSIS — S72012D Unspecified intracapsular fracture of left femur, subsequent encounter for closed fracture with routine healing: Secondary | ICD-10-CM | POA: Diagnosis not present

## 2017-08-11 DIAGNOSIS — G20 Parkinson's disease: Secondary | ICD-10-CM | POA: Diagnosis not present

## 2017-08-11 DIAGNOSIS — M25612 Stiffness of left shoulder, not elsewhere classified: Secondary | ICD-10-CM | POA: Diagnosis not present

## 2017-08-11 DIAGNOSIS — S72012D Unspecified intracapsular fracture of left femur, subsequent encounter for closed fracture with routine healing: Secondary | ICD-10-CM | POA: Diagnosis not present

## 2017-08-11 DIAGNOSIS — F329 Major depressive disorder, single episode, unspecified: Secondary | ICD-10-CM | POA: Diagnosis not present

## 2017-08-11 DIAGNOSIS — I2541 Coronary artery aneurysm: Secondary | ICD-10-CM | POA: Diagnosis not present

## 2017-08-11 DIAGNOSIS — I69022 Dysarthria following nontraumatic subarachnoid hemorrhage: Secondary | ICD-10-CM | POA: Diagnosis not present

## 2017-08-11 DIAGNOSIS — M25622 Stiffness of left elbow, not elsewhere classified: Secondary | ICD-10-CM | POA: Diagnosis not present

## 2017-08-11 DIAGNOSIS — I69354 Hemiplegia and hemiparesis following cerebral infarction affecting left non-dominant side: Secondary | ICD-10-CM | POA: Diagnosis not present

## 2017-08-11 DIAGNOSIS — M6281 Muscle weakness (generalized): Secondary | ICD-10-CM | POA: Diagnosis not present

## 2017-08-11 DIAGNOSIS — Z9181 History of falling: Secondary | ICD-10-CM | POA: Diagnosis not present

## 2017-08-11 DIAGNOSIS — R293 Abnormal posture: Secondary | ICD-10-CM | POA: Diagnosis not present

## 2017-08-11 DIAGNOSIS — R2689 Other abnormalities of gait and mobility: Secondary | ICD-10-CM | POA: Diagnosis not present

## 2017-08-11 DIAGNOSIS — R278 Other lack of coordination: Secondary | ICD-10-CM | POA: Diagnosis not present

## 2017-08-13 DIAGNOSIS — S72012D Unspecified intracapsular fracture of left femur, subsequent encounter for closed fracture with routine healing: Secondary | ICD-10-CM | POA: Diagnosis not present

## 2017-08-13 DIAGNOSIS — I2541 Coronary artery aneurysm: Secondary | ICD-10-CM | POA: Diagnosis not present

## 2017-08-13 DIAGNOSIS — G20 Parkinson's disease: Secondary | ICD-10-CM | POA: Diagnosis not present

## 2017-08-13 DIAGNOSIS — M6281 Muscle weakness (generalized): Secondary | ICD-10-CM | POA: Diagnosis not present

## 2017-08-13 DIAGNOSIS — I69354 Hemiplegia and hemiparesis following cerebral infarction affecting left non-dominant side: Secondary | ICD-10-CM | POA: Diagnosis not present

## 2017-08-13 DIAGNOSIS — Z9181 History of falling: Secondary | ICD-10-CM | POA: Diagnosis not present

## 2017-08-14 DIAGNOSIS — Z79899 Other long term (current) drug therapy: Secondary | ICD-10-CM | POA: Diagnosis not present

## 2017-08-19 DIAGNOSIS — M6281 Muscle weakness (generalized): Secondary | ICD-10-CM | POA: Diagnosis not present

## 2017-08-19 DIAGNOSIS — S72012D Unspecified intracapsular fracture of left femur, subsequent encounter for closed fracture with routine healing: Secondary | ICD-10-CM | POA: Diagnosis not present

## 2017-08-19 DIAGNOSIS — Z9181 History of falling: Secondary | ICD-10-CM | POA: Diagnosis not present

## 2017-08-19 DIAGNOSIS — I69354 Hemiplegia and hemiparesis following cerebral infarction affecting left non-dominant side: Secondary | ICD-10-CM | POA: Diagnosis not present

## 2017-08-19 DIAGNOSIS — G20 Parkinson's disease: Secondary | ICD-10-CM | POA: Diagnosis not present

## 2017-08-19 DIAGNOSIS — I2541 Coronary artery aneurysm: Secondary | ICD-10-CM | POA: Diagnosis not present

## 2017-08-20 DIAGNOSIS — Z9181 History of falling: Secondary | ICD-10-CM | POA: Diagnosis not present

## 2017-08-20 DIAGNOSIS — S72012D Unspecified intracapsular fracture of left femur, subsequent encounter for closed fracture with routine healing: Secondary | ICD-10-CM | POA: Diagnosis not present

## 2017-08-20 DIAGNOSIS — I69354 Hemiplegia and hemiparesis following cerebral infarction affecting left non-dominant side: Secondary | ICD-10-CM | POA: Diagnosis not present

## 2017-08-20 DIAGNOSIS — M6281 Muscle weakness (generalized): Secondary | ICD-10-CM | POA: Diagnosis not present

## 2017-08-20 DIAGNOSIS — Z79899 Other long term (current) drug therapy: Secondary | ICD-10-CM | POA: Diagnosis not present

## 2017-08-20 DIAGNOSIS — I2541 Coronary artery aneurysm: Secondary | ICD-10-CM | POA: Diagnosis not present

## 2017-08-20 DIAGNOSIS — G20 Parkinson's disease: Secondary | ICD-10-CM | POA: Diagnosis not present

## 2017-08-21 DIAGNOSIS — G20 Parkinson's disease: Secondary | ICD-10-CM | POA: Diagnosis not present

## 2017-08-21 DIAGNOSIS — M6281 Muscle weakness (generalized): Secondary | ICD-10-CM | POA: Diagnosis not present

## 2017-08-21 DIAGNOSIS — I2541 Coronary artery aneurysm: Secondary | ICD-10-CM | POA: Diagnosis not present

## 2017-08-21 DIAGNOSIS — Z9181 History of falling: Secondary | ICD-10-CM | POA: Diagnosis not present

## 2017-08-21 DIAGNOSIS — I69354 Hemiplegia and hemiparesis following cerebral infarction affecting left non-dominant side: Secondary | ICD-10-CM | POA: Diagnosis not present

## 2017-08-21 DIAGNOSIS — S72012D Unspecified intracapsular fracture of left femur, subsequent encounter for closed fracture with routine healing: Secondary | ICD-10-CM | POA: Diagnosis not present

## 2017-08-22 DIAGNOSIS — I2541 Coronary artery aneurysm: Secondary | ICD-10-CM | POA: Diagnosis not present

## 2017-08-22 DIAGNOSIS — G20 Parkinson's disease: Secondary | ICD-10-CM | POA: Diagnosis not present

## 2017-08-22 DIAGNOSIS — Z9181 History of falling: Secondary | ICD-10-CM | POA: Diagnosis not present

## 2017-08-22 DIAGNOSIS — S72012D Unspecified intracapsular fracture of left femur, subsequent encounter for closed fracture with routine healing: Secondary | ICD-10-CM | POA: Diagnosis not present

## 2017-08-22 DIAGNOSIS — M6281 Muscle weakness (generalized): Secondary | ICD-10-CM | POA: Diagnosis not present

## 2017-08-22 DIAGNOSIS — I69354 Hemiplegia and hemiparesis following cerebral infarction affecting left non-dominant side: Secondary | ICD-10-CM | POA: Diagnosis not present

## 2017-08-25 DIAGNOSIS — S72012D Unspecified intracapsular fracture of left femur, subsequent encounter for closed fracture with routine healing: Secondary | ICD-10-CM | POA: Diagnosis not present

## 2017-08-25 DIAGNOSIS — I69354 Hemiplegia and hemiparesis following cerebral infarction affecting left non-dominant side: Secondary | ICD-10-CM | POA: Diagnosis not present

## 2017-08-25 DIAGNOSIS — Z79899 Other long term (current) drug therapy: Secondary | ICD-10-CM | POA: Diagnosis not present

## 2017-08-25 DIAGNOSIS — M6281 Muscle weakness (generalized): Secondary | ICD-10-CM | POA: Diagnosis not present

## 2017-08-25 DIAGNOSIS — Z9181 History of falling: Secondary | ICD-10-CM | POA: Diagnosis not present

## 2017-08-25 DIAGNOSIS — G20 Parkinson's disease: Secondary | ICD-10-CM | POA: Diagnosis not present

## 2017-08-25 DIAGNOSIS — I2541 Coronary artery aneurysm: Secondary | ICD-10-CM | POA: Diagnosis not present

## 2017-08-27 DIAGNOSIS — I69354 Hemiplegia and hemiparesis following cerebral infarction affecting left non-dominant side: Secondary | ICD-10-CM | POA: Diagnosis not present

## 2017-08-27 DIAGNOSIS — G20 Parkinson's disease: Secondary | ICD-10-CM | POA: Diagnosis not present

## 2017-08-27 DIAGNOSIS — Z9181 History of falling: Secondary | ICD-10-CM | POA: Diagnosis not present

## 2017-08-27 DIAGNOSIS — M6281 Muscle weakness (generalized): Secondary | ICD-10-CM | POA: Diagnosis not present

## 2017-08-27 DIAGNOSIS — I2541 Coronary artery aneurysm: Secondary | ICD-10-CM | POA: Diagnosis not present

## 2017-08-27 DIAGNOSIS — S72012D Unspecified intracapsular fracture of left femur, subsequent encounter for closed fracture with routine healing: Secondary | ICD-10-CM | POA: Diagnosis not present

## 2017-08-29 DIAGNOSIS — I69354 Hemiplegia and hemiparesis following cerebral infarction affecting left non-dominant side: Secondary | ICD-10-CM | POA: Diagnosis not present

## 2017-08-29 DIAGNOSIS — Z9181 History of falling: Secondary | ICD-10-CM | POA: Diagnosis not present

## 2017-08-29 DIAGNOSIS — Z79899 Other long term (current) drug therapy: Secondary | ICD-10-CM | POA: Diagnosis not present

## 2017-08-29 DIAGNOSIS — M6281 Muscle weakness (generalized): Secondary | ICD-10-CM | POA: Diagnosis not present

## 2017-08-29 DIAGNOSIS — G20 Parkinson's disease: Secondary | ICD-10-CM | POA: Diagnosis not present

## 2017-08-29 DIAGNOSIS — I2541 Coronary artery aneurysm: Secondary | ICD-10-CM | POA: Diagnosis not present

## 2017-08-29 DIAGNOSIS — S72012D Unspecified intracapsular fracture of left femur, subsequent encounter for closed fracture with routine healing: Secondary | ICD-10-CM | POA: Diagnosis not present

## 2017-09-01 DIAGNOSIS — G20 Parkinson's disease: Secondary | ICD-10-CM | POA: Diagnosis not present

## 2017-09-01 DIAGNOSIS — Z9181 History of falling: Secondary | ICD-10-CM | POA: Diagnosis not present

## 2017-09-01 DIAGNOSIS — I2541 Coronary artery aneurysm: Secondary | ICD-10-CM | POA: Diagnosis not present

## 2017-09-01 DIAGNOSIS — I69354 Hemiplegia and hemiparesis following cerebral infarction affecting left non-dominant side: Secondary | ICD-10-CM | POA: Diagnosis not present

## 2017-09-01 DIAGNOSIS — S72012D Unspecified intracapsular fracture of left femur, subsequent encounter for closed fracture with routine healing: Secondary | ICD-10-CM | POA: Diagnosis not present

## 2017-09-01 DIAGNOSIS — M6281 Muscle weakness (generalized): Secondary | ICD-10-CM | POA: Diagnosis not present

## 2017-09-03 DIAGNOSIS — I69354 Hemiplegia and hemiparesis following cerebral infarction affecting left non-dominant side: Secondary | ICD-10-CM | POA: Diagnosis not present

## 2017-09-03 DIAGNOSIS — Z9181 History of falling: Secondary | ICD-10-CM | POA: Diagnosis not present

## 2017-09-03 DIAGNOSIS — G20 Parkinson's disease: Secondary | ICD-10-CM | POA: Diagnosis not present

## 2017-09-03 DIAGNOSIS — M6281 Muscle weakness (generalized): Secondary | ICD-10-CM | POA: Diagnosis not present

## 2017-09-03 DIAGNOSIS — I2541 Coronary artery aneurysm: Secondary | ICD-10-CM | POA: Diagnosis not present

## 2017-09-03 DIAGNOSIS — S72012D Unspecified intracapsular fracture of left femur, subsequent encounter for closed fracture with routine healing: Secondary | ICD-10-CM | POA: Diagnosis not present

## 2017-09-05 DIAGNOSIS — S72012D Unspecified intracapsular fracture of left femur, subsequent encounter for closed fracture with routine healing: Secondary | ICD-10-CM | POA: Diagnosis not present

## 2017-09-05 DIAGNOSIS — Z9181 History of falling: Secondary | ICD-10-CM | POA: Diagnosis not present

## 2017-09-05 DIAGNOSIS — G20 Parkinson's disease: Secondary | ICD-10-CM | POA: Diagnosis not present

## 2017-09-05 DIAGNOSIS — I69354 Hemiplegia and hemiparesis following cerebral infarction affecting left non-dominant side: Secondary | ICD-10-CM | POA: Diagnosis not present

## 2017-09-05 DIAGNOSIS — M6281 Muscle weakness (generalized): Secondary | ICD-10-CM | POA: Diagnosis not present

## 2017-09-05 DIAGNOSIS — I2541 Coronary artery aneurysm: Secondary | ICD-10-CM | POA: Diagnosis not present

## 2017-09-08 DIAGNOSIS — M6281 Muscle weakness (generalized): Secondary | ICD-10-CM | POA: Diagnosis not present

## 2017-09-08 DIAGNOSIS — I69354 Hemiplegia and hemiparesis following cerebral infarction affecting left non-dominant side: Secondary | ICD-10-CM | POA: Diagnosis not present

## 2017-09-08 DIAGNOSIS — G20 Parkinson's disease: Secondary | ICD-10-CM | POA: Diagnosis not present

## 2017-09-08 DIAGNOSIS — Z9181 History of falling: Secondary | ICD-10-CM | POA: Diagnosis not present

## 2017-09-08 DIAGNOSIS — I2541 Coronary artery aneurysm: Secondary | ICD-10-CM | POA: Diagnosis not present

## 2017-09-08 DIAGNOSIS — S72012D Unspecified intracapsular fracture of left femur, subsequent encounter for closed fracture with routine healing: Secondary | ICD-10-CM | POA: Diagnosis not present

## 2017-09-11 DIAGNOSIS — M6281 Muscle weakness (generalized): Secondary | ICD-10-CM | POA: Diagnosis not present

## 2017-09-11 DIAGNOSIS — R293 Abnormal posture: Secondary | ICD-10-CM | POA: Diagnosis not present

## 2017-09-11 DIAGNOSIS — I2541 Coronary artery aneurysm: Secondary | ICD-10-CM | POA: Diagnosis not present

## 2017-09-11 DIAGNOSIS — R2689 Other abnormalities of gait and mobility: Secondary | ICD-10-CM | POA: Diagnosis not present

## 2017-09-11 DIAGNOSIS — M25622 Stiffness of left elbow, not elsewhere classified: Secondary | ICD-10-CM | POA: Diagnosis not present

## 2017-09-11 DIAGNOSIS — M79622 Pain in left upper arm: Secondary | ICD-10-CM | POA: Diagnosis not present

## 2017-09-11 DIAGNOSIS — S72012D Unspecified intracapsular fracture of left femur, subsequent encounter for closed fracture with routine healing: Secondary | ICD-10-CM | POA: Diagnosis not present

## 2017-09-11 DIAGNOSIS — G20 Parkinson's disease: Secondary | ICD-10-CM | POA: Diagnosis not present

## 2017-09-11 DIAGNOSIS — I69354 Hemiplegia and hemiparesis following cerebral infarction affecting left non-dominant side: Secondary | ICD-10-CM | POA: Diagnosis not present

## 2017-09-11 DIAGNOSIS — I69022 Dysarthria following nontraumatic subarachnoid hemorrhage: Secondary | ICD-10-CM | POA: Diagnosis not present

## 2017-09-11 DIAGNOSIS — Z9181 History of falling: Secondary | ICD-10-CM | POA: Diagnosis not present

## 2017-09-11 DIAGNOSIS — M25612 Stiffness of left shoulder, not elsewhere classified: Secondary | ICD-10-CM | POA: Diagnosis not present

## 2017-09-11 DIAGNOSIS — R278 Other lack of coordination: Secondary | ICD-10-CM | POA: Diagnosis not present

## 2017-09-11 DIAGNOSIS — F329 Major depressive disorder, single episode, unspecified: Secondary | ICD-10-CM | POA: Diagnosis not present

## 2017-09-11 DIAGNOSIS — M79632 Pain in left forearm: Secondary | ICD-10-CM | POA: Diagnosis not present

## 2017-09-15 DIAGNOSIS — N183 Chronic kidney disease, stage 3 (moderate): Secondary | ICD-10-CM | POA: Diagnosis not present

## 2017-09-15 DIAGNOSIS — F0280 Dementia in other diseases classified elsewhere without behavioral disturbance: Secondary | ICD-10-CM | POA: Diagnosis not present

## 2017-09-15 DIAGNOSIS — G4700 Insomnia, unspecified: Secondary | ICD-10-CM | POA: Diagnosis not present

## 2017-09-15 DIAGNOSIS — F323 Major depressive disorder, single episode, severe with psychotic features: Secondary | ICD-10-CM | POA: Diagnosis not present

## 2017-09-15 DIAGNOSIS — G20 Parkinson's disease: Secondary | ICD-10-CM | POA: Diagnosis not present

## 2017-09-16 DIAGNOSIS — I2510 Atherosclerotic heart disease of native coronary artery without angina pectoris: Secondary | ICD-10-CM | POA: Diagnosis not present

## 2017-09-16 DIAGNOSIS — G20 Parkinson's disease: Secondary | ICD-10-CM | POA: Diagnosis not present

## 2017-09-16 DIAGNOSIS — L719 Rosacea, unspecified: Secondary | ICD-10-CM | POA: Diagnosis not present

## 2017-09-16 DIAGNOSIS — I69354 Hemiplegia and hemiparesis following cerebral infarction affecting left non-dominant side: Secondary | ICD-10-CM | POA: Diagnosis not present

## 2017-09-22 DIAGNOSIS — E785 Hyperlipidemia, unspecified: Secondary | ICD-10-CM | POA: Diagnosis not present

## 2017-09-22 DIAGNOSIS — Z79899 Other long term (current) drug therapy: Secondary | ICD-10-CM | POA: Diagnosis not present

## 2017-10-03 DIAGNOSIS — I10 Essential (primary) hypertension: Secondary | ICD-10-CM | POA: Diagnosis not present

## 2017-10-03 DIAGNOSIS — Z955 Presence of coronary angioplasty implant and graft: Secondary | ICD-10-CM | POA: Diagnosis not present

## 2017-10-03 DIAGNOSIS — I2510 Atherosclerotic heart disease of native coronary artery without angina pectoris: Secondary | ICD-10-CM | POA: Diagnosis not present

## 2017-10-03 DIAGNOSIS — Z8673 Personal history of transient ischemic attack (TIA), and cerebral infarction without residual deficits: Secondary | ICD-10-CM | POA: Diagnosis not present

## 2017-10-03 DIAGNOSIS — E7849 Other hyperlipidemia: Secondary | ICD-10-CM | POA: Diagnosis not present

## 2017-10-03 DIAGNOSIS — I252 Old myocardial infarction: Secondary | ICD-10-CM | POA: Diagnosis not present

## 2017-10-06 DIAGNOSIS — Z79899 Other long term (current) drug therapy: Secondary | ICD-10-CM | POA: Diagnosis not present

## 2017-11-03 DIAGNOSIS — F0280 Dementia in other diseases classified elsewhere without behavioral disturbance: Secondary | ICD-10-CM | POA: Diagnosis not present

## 2017-11-03 DIAGNOSIS — G4700 Insomnia, unspecified: Secondary | ICD-10-CM | POA: Diagnosis not present

## 2017-11-03 DIAGNOSIS — F323 Major depressive disorder, single episode, severe with psychotic features: Secondary | ICD-10-CM | POA: Diagnosis not present

## 2017-12-26 DIAGNOSIS — F323 Major depressive disorder, single episode, severe with psychotic features: Secondary | ICD-10-CM | POA: Diagnosis not present

## 2017-12-26 DIAGNOSIS — F0280 Dementia in other diseases classified elsewhere without behavioral disturbance: Secondary | ICD-10-CM | POA: Diagnosis not present

## 2017-12-26 DIAGNOSIS — G4700 Insomnia, unspecified: Secondary | ICD-10-CM | POA: Diagnosis not present

## 2017-12-30 ENCOUNTER — Other Ambulatory Visit: Payer: Self-pay

## 2017-12-31 DIAGNOSIS — Z79899 Other long term (current) drug therapy: Secondary | ICD-10-CM | POA: Diagnosis not present

## 2018-01-22 DIAGNOSIS — E119 Type 2 diabetes mellitus without complications: Secondary | ICD-10-CM | POA: Diagnosis not present

## 2018-01-27 DIAGNOSIS — I10 Essential (primary) hypertension: Secondary | ICD-10-CM | POA: Diagnosis not present

## 2018-01-27 DIAGNOSIS — E119 Type 2 diabetes mellitus without complications: Secondary | ICD-10-CM | POA: Diagnosis not present

## 2018-01-27 DIAGNOSIS — E1159 Type 2 diabetes mellitus with other circulatory complications: Secondary | ICD-10-CM | POA: Diagnosis not present

## 2018-01-27 DIAGNOSIS — I2510 Atherosclerotic heart disease of native coronary artery without angina pectoris: Secondary | ICD-10-CM | POA: Diagnosis not present

## 2018-03-06 DIAGNOSIS — M549 Dorsalgia, unspecified: Secondary | ICD-10-CM | POA: Diagnosis not present

## 2018-03-06 DIAGNOSIS — M25559 Pain in unspecified hip: Secondary | ICD-10-CM | POA: Diagnosis not present

## 2018-03-06 DIAGNOSIS — I69354 Hemiplegia and hemiparesis following cerebral infarction affecting left non-dominant side: Secondary | ICD-10-CM | POA: Diagnosis not present

## 2018-03-13 DIAGNOSIS — F323 Major depressive disorder, single episode, severe with psychotic features: Secondary | ICD-10-CM | POA: Diagnosis not present

## 2018-03-13 DIAGNOSIS — G4700 Insomnia, unspecified: Secondary | ICD-10-CM | POA: Diagnosis not present

## 2018-03-23 DIAGNOSIS — N390 Urinary tract infection, site not specified: Secondary | ICD-10-CM | POA: Diagnosis not present

## 2018-04-06 DIAGNOSIS — I10 Essential (primary) hypertension: Secondary | ICD-10-CM | POA: Diagnosis not present

## 2018-04-06 DIAGNOSIS — M21962 Unspecified acquired deformity of left lower leg: Secondary | ICD-10-CM | POA: Diagnosis not present

## 2018-04-06 DIAGNOSIS — E1159 Type 2 diabetes mellitus with other circulatory complications: Secondary | ICD-10-CM | POA: Diagnosis not present

## 2018-04-17 DIAGNOSIS — M79632 Pain in left forearm: Secondary | ICD-10-CM | POA: Diagnosis not present

## 2018-04-17 DIAGNOSIS — R2681 Unsteadiness on feet: Secondary | ICD-10-CM | POA: Diagnosis not present

## 2018-04-17 DIAGNOSIS — I69354 Hemiplegia and hemiparesis following cerebral infarction affecting left non-dominant side: Secondary | ICD-10-CM | POA: Diagnosis not present

## 2018-04-17 DIAGNOSIS — R278 Other lack of coordination: Secondary | ICD-10-CM | POA: Diagnosis not present

## 2018-04-17 DIAGNOSIS — G20 Parkinson's disease: Secondary | ICD-10-CM | POA: Diagnosis not present

## 2018-04-17 DIAGNOSIS — R293 Abnormal posture: Secondary | ICD-10-CM | POA: Diagnosis not present

## 2018-04-17 DIAGNOSIS — M25612 Stiffness of left shoulder, not elsewhere classified: Secondary | ICD-10-CM | POA: Diagnosis not present

## 2018-04-17 DIAGNOSIS — M79622 Pain in left upper arm: Secondary | ICD-10-CM | POA: Diagnosis not present

## 2018-04-17 DIAGNOSIS — M25622 Stiffness of left elbow, not elsewhere classified: Secondary | ICD-10-CM | POA: Diagnosis not present

## 2018-04-17 DIAGNOSIS — F329 Major depressive disorder, single episode, unspecified: Secondary | ICD-10-CM | POA: Diagnosis not present

## 2018-04-17 DIAGNOSIS — Z9181 History of falling: Secondary | ICD-10-CM | POA: Diagnosis not present

## 2018-04-17 DIAGNOSIS — M6281 Muscle weakness (generalized): Secondary | ICD-10-CM | POA: Diagnosis not present

## 2018-04-17 DIAGNOSIS — I69022 Dysarthria following nontraumatic subarachnoid hemorrhage: Secondary | ICD-10-CM | POA: Diagnosis not present

## 2018-04-17 DIAGNOSIS — I2541 Coronary artery aneurysm: Secondary | ICD-10-CM | POA: Diagnosis not present

## 2018-04-17 DIAGNOSIS — R2689 Other abnormalities of gait and mobility: Secondary | ICD-10-CM | POA: Diagnosis not present

## 2018-04-17 DIAGNOSIS — S72012D Unspecified intracapsular fracture of left femur, subsequent encounter for closed fracture with routine healing: Secondary | ICD-10-CM | POA: Diagnosis not present

## 2018-04-21 DIAGNOSIS — F323 Major depressive disorder, single episode, severe with psychotic features: Secondary | ICD-10-CM | POA: Diagnosis not present

## 2018-04-21 DIAGNOSIS — G4700 Insomnia, unspecified: Secondary | ICD-10-CM | POA: Diagnosis not present

## 2018-04-21 DIAGNOSIS — F0280 Dementia in other diseases classified elsewhere without behavioral disturbance: Secondary | ICD-10-CM | POA: Diagnosis not present

## 2018-04-22 DIAGNOSIS — I69354 Hemiplegia and hemiparesis following cerebral infarction affecting left non-dominant side: Secondary | ICD-10-CM | POA: Diagnosis not present

## 2018-04-22 DIAGNOSIS — Z9181 History of falling: Secondary | ICD-10-CM | POA: Diagnosis not present

## 2018-04-22 DIAGNOSIS — M6281 Muscle weakness (generalized): Secondary | ICD-10-CM | POA: Diagnosis not present

## 2018-04-22 DIAGNOSIS — I2541 Coronary artery aneurysm: Secondary | ICD-10-CM | POA: Diagnosis not present

## 2018-04-22 DIAGNOSIS — S72012D Unspecified intracapsular fracture of left femur, subsequent encounter for closed fracture with routine healing: Secondary | ICD-10-CM | POA: Diagnosis not present

## 2018-04-22 DIAGNOSIS — G20 Parkinson's disease: Secondary | ICD-10-CM | POA: Diagnosis not present

## 2018-04-23 DIAGNOSIS — S72012D Unspecified intracapsular fracture of left femur, subsequent encounter for closed fracture with routine healing: Secondary | ICD-10-CM | POA: Diagnosis not present

## 2018-04-23 DIAGNOSIS — M6281 Muscle weakness (generalized): Secondary | ICD-10-CM | POA: Diagnosis not present

## 2018-04-23 DIAGNOSIS — I69354 Hemiplegia and hemiparesis following cerebral infarction affecting left non-dominant side: Secondary | ICD-10-CM | POA: Diagnosis not present

## 2018-04-23 DIAGNOSIS — G20 Parkinson's disease: Secondary | ICD-10-CM | POA: Diagnosis not present

## 2018-04-23 DIAGNOSIS — Z9181 History of falling: Secondary | ICD-10-CM | POA: Diagnosis not present

## 2018-04-23 DIAGNOSIS — I2541 Coronary artery aneurysm: Secondary | ICD-10-CM | POA: Diagnosis not present

## 2018-04-24 DIAGNOSIS — S72012D Unspecified intracapsular fracture of left femur, subsequent encounter for closed fracture with routine healing: Secondary | ICD-10-CM | POA: Diagnosis not present

## 2018-04-24 DIAGNOSIS — G20 Parkinson's disease: Secondary | ICD-10-CM | POA: Diagnosis not present

## 2018-04-24 DIAGNOSIS — E119 Type 2 diabetes mellitus without complications: Secondary | ICD-10-CM | POA: Diagnosis not present

## 2018-04-24 DIAGNOSIS — I69354 Hemiplegia and hemiparesis following cerebral infarction affecting left non-dominant side: Secondary | ICD-10-CM | POA: Diagnosis not present

## 2018-04-24 DIAGNOSIS — I2541 Coronary artery aneurysm: Secondary | ICD-10-CM | POA: Diagnosis not present

## 2018-04-24 DIAGNOSIS — Z9181 History of falling: Secondary | ICD-10-CM | POA: Diagnosis not present

## 2018-04-24 DIAGNOSIS — M6281 Muscle weakness (generalized): Secondary | ICD-10-CM | POA: Diagnosis not present

## 2018-04-27 DIAGNOSIS — M6281 Muscle weakness (generalized): Secondary | ICD-10-CM | POA: Diagnosis not present

## 2018-04-27 DIAGNOSIS — G20 Parkinson's disease: Secondary | ICD-10-CM | POA: Diagnosis not present

## 2018-04-27 DIAGNOSIS — I2541 Coronary artery aneurysm: Secondary | ICD-10-CM | POA: Diagnosis not present

## 2018-04-27 DIAGNOSIS — Z9181 History of falling: Secondary | ICD-10-CM | POA: Diagnosis not present

## 2018-04-27 DIAGNOSIS — S72012D Unspecified intracapsular fracture of left femur, subsequent encounter for closed fracture with routine healing: Secondary | ICD-10-CM | POA: Diagnosis not present

## 2018-04-27 DIAGNOSIS — I69354 Hemiplegia and hemiparesis following cerebral infarction affecting left non-dominant side: Secondary | ICD-10-CM | POA: Diagnosis not present

## 2018-04-28 DIAGNOSIS — S72012D Unspecified intracapsular fracture of left femur, subsequent encounter for closed fracture with routine healing: Secondary | ICD-10-CM | POA: Diagnosis not present

## 2018-04-28 DIAGNOSIS — I2541 Coronary artery aneurysm: Secondary | ICD-10-CM | POA: Diagnosis not present

## 2018-04-28 DIAGNOSIS — Z9181 History of falling: Secondary | ICD-10-CM | POA: Diagnosis not present

## 2018-04-28 DIAGNOSIS — I69354 Hemiplegia and hemiparesis following cerebral infarction affecting left non-dominant side: Secondary | ICD-10-CM | POA: Diagnosis not present

## 2018-04-28 DIAGNOSIS — G20 Parkinson's disease: Secondary | ICD-10-CM | POA: Diagnosis not present

## 2018-04-28 DIAGNOSIS — M6281 Muscle weakness (generalized): Secondary | ICD-10-CM | POA: Diagnosis not present

## 2018-05-01 DIAGNOSIS — M6281 Muscle weakness (generalized): Secondary | ICD-10-CM | POA: Diagnosis not present

## 2018-05-01 DIAGNOSIS — I69354 Hemiplegia and hemiparesis following cerebral infarction affecting left non-dominant side: Secondary | ICD-10-CM | POA: Diagnosis not present

## 2018-05-01 DIAGNOSIS — G20 Parkinson's disease: Secondary | ICD-10-CM | POA: Diagnosis not present

## 2018-05-01 DIAGNOSIS — S72012D Unspecified intracapsular fracture of left femur, subsequent encounter for closed fracture with routine healing: Secondary | ICD-10-CM | POA: Diagnosis not present

## 2018-05-01 DIAGNOSIS — Z9181 History of falling: Secondary | ICD-10-CM | POA: Diagnosis not present

## 2018-05-01 DIAGNOSIS — I2541 Coronary artery aneurysm: Secondary | ICD-10-CM | POA: Diagnosis not present

## 2018-05-05 DIAGNOSIS — S72012D Unspecified intracapsular fracture of left femur, subsequent encounter for closed fracture with routine healing: Secondary | ICD-10-CM | POA: Diagnosis not present

## 2018-05-05 DIAGNOSIS — I69354 Hemiplegia and hemiparesis following cerebral infarction affecting left non-dominant side: Secondary | ICD-10-CM | POA: Diagnosis not present

## 2018-05-05 DIAGNOSIS — M6281 Muscle weakness (generalized): Secondary | ICD-10-CM | POA: Diagnosis not present

## 2018-05-05 DIAGNOSIS — Z9181 History of falling: Secondary | ICD-10-CM | POA: Diagnosis not present

## 2018-05-05 DIAGNOSIS — I2541 Coronary artery aneurysm: Secondary | ICD-10-CM | POA: Diagnosis not present

## 2018-05-05 DIAGNOSIS — G20 Parkinson's disease: Secondary | ICD-10-CM | POA: Diagnosis not present

## 2018-05-07 DIAGNOSIS — G20 Parkinson's disease: Secondary | ICD-10-CM | POA: Diagnosis not present

## 2018-05-07 DIAGNOSIS — M6281 Muscle weakness (generalized): Secondary | ICD-10-CM | POA: Diagnosis not present

## 2018-05-07 DIAGNOSIS — I2541 Coronary artery aneurysm: Secondary | ICD-10-CM | POA: Diagnosis not present

## 2018-05-07 DIAGNOSIS — S72012D Unspecified intracapsular fracture of left femur, subsequent encounter for closed fracture with routine healing: Secondary | ICD-10-CM | POA: Diagnosis not present

## 2018-05-07 DIAGNOSIS — Z9181 History of falling: Secondary | ICD-10-CM | POA: Diagnosis not present

## 2018-05-07 DIAGNOSIS — I69354 Hemiplegia and hemiparesis following cerebral infarction affecting left non-dominant side: Secondary | ICD-10-CM | POA: Diagnosis not present

## 2018-05-19 DIAGNOSIS — Z0000 Encounter for general adult medical examination without abnormal findings: Secondary | ICD-10-CM | POA: Diagnosis not present

## 2018-05-19 DIAGNOSIS — Z1331 Encounter for screening for depression: Secondary | ICD-10-CM | POA: Diagnosis not present

## 2018-05-19 DIAGNOSIS — G20 Parkinson's disease: Secondary | ICD-10-CM | POA: Diagnosis not present

## 2018-05-19 DIAGNOSIS — Z139 Encounter for screening, unspecified: Secondary | ICD-10-CM | POA: Diagnosis not present

## 2018-05-26 DIAGNOSIS — E119 Type 2 diabetes mellitus without complications: Secondary | ICD-10-CM | POA: Diagnosis not present

## 2018-05-26 DIAGNOSIS — F0280 Dementia in other diseases classified elsewhere without behavioral disturbance: Secondary | ICD-10-CM | POA: Diagnosis not present

## 2018-05-26 DIAGNOSIS — G4700 Insomnia, unspecified: Secondary | ICD-10-CM | POA: Diagnosis not present

## 2018-05-26 DIAGNOSIS — F323 Major depressive disorder, single episode, severe with psychotic features: Secondary | ICD-10-CM | POA: Diagnosis not present

## 2018-06-09 DIAGNOSIS — Z1383 Encounter for screening for respiratory disorder NEC: Secondary | ICD-10-CM | POA: Diagnosis not present

## 2018-06-09 DIAGNOSIS — Z20828 Contact with and (suspected) exposure to other viral communicable diseases: Secondary | ICD-10-CM | POA: Diagnosis not present

## 2018-06-19 DIAGNOSIS — G4700 Insomnia, unspecified: Secondary | ICD-10-CM | POA: Diagnosis not present

## 2018-06-19 DIAGNOSIS — F323 Major depressive disorder, single episode, severe with psychotic features: Secondary | ICD-10-CM | POA: Diagnosis not present

## 2018-06-19 DIAGNOSIS — F0280 Dementia in other diseases classified elsewhere without behavioral disturbance: Secondary | ICD-10-CM | POA: Diagnosis not present

## 2018-06-22 DIAGNOSIS — I10 Essential (primary) hypertension: Secondary | ICD-10-CM | POA: Diagnosis not present

## 2018-06-22 DIAGNOSIS — E119 Type 2 diabetes mellitus without complications: Secondary | ICD-10-CM | POA: Diagnosis not present

## 2018-06-22 DIAGNOSIS — E1159 Type 2 diabetes mellitus with other circulatory complications: Secondary | ICD-10-CM | POA: Diagnosis not present

## 2018-07-05 DIAGNOSIS — Z03818 Encounter for observation for suspected exposure to other biological agents ruled out: Secondary | ICD-10-CM | POA: Diagnosis not present

## 2018-07-12 DIAGNOSIS — R0689 Other abnormalities of breathing: Secondary | ICD-10-CM | POA: Diagnosis not present

## 2018-07-12 DIAGNOSIS — U071 COVID-19: Secondary | ICD-10-CM | POA: Diagnosis not present

## 2018-07-12 DIAGNOSIS — R0902 Hypoxemia: Secondary | ICD-10-CM | POA: Diagnosis not present

## 2018-07-12 DIAGNOSIS — R404 Transient alteration of awareness: Secondary | ICD-10-CM | POA: Diagnosis not present

## 2018-07-12 DIAGNOSIS — Z209 Contact with and (suspected) exposure to unspecified communicable disease: Secondary | ICD-10-CM | POA: Diagnosis not present

## 2018-07-12 DIAGNOSIS — Z7982 Long term (current) use of aspirin: Secondary | ICD-10-CM | POA: Diagnosis not present

## 2018-07-12 DIAGNOSIS — I447 Left bundle-branch block, unspecified: Secondary | ICD-10-CM | POA: Diagnosis not present

## 2018-07-12 DIAGNOSIS — Z79899 Other long term (current) drug therapy: Secondary | ICD-10-CM | POA: Diagnosis not present

## 2018-07-12 DIAGNOSIS — R4182 Altered mental status, unspecified: Secondary | ICD-10-CM | POA: Diagnosis not present

## 2018-07-12 DIAGNOSIS — I252 Old myocardial infarction: Secondary | ICD-10-CM | POA: Diagnosis not present

## 2018-07-12 DIAGNOSIS — Z7902 Long term (current) use of antithrombotics/antiplatelets: Secondary | ICD-10-CM | POA: Diagnosis not present

## 2018-07-12 DIAGNOSIS — I10 Essential (primary) hypertension: Secondary | ICD-10-CM | POA: Diagnosis not present

## 2018-07-12 DIAGNOSIS — G629 Polyneuropathy, unspecified: Secondary | ICD-10-CM | POA: Diagnosis not present

## 2018-07-12 DIAGNOSIS — Z87891 Personal history of nicotine dependence: Secondary | ICD-10-CM | POA: Diagnosis not present

## 2018-07-12 DIAGNOSIS — Z79891 Long term (current) use of opiate analgesic: Secondary | ICD-10-CM | POA: Diagnosis not present

## 2018-07-13 ENCOUNTER — Inpatient Hospital Stay (HOSPITAL_COMMUNITY): Payer: Medicare Other

## 2018-07-13 ENCOUNTER — Inpatient Hospital Stay (HOSPITAL_COMMUNITY)
Admission: AD | Admit: 2018-07-13 | Discharge: 2018-07-17 | DRG: 177 | Disposition: A | Discharge status: Skilled Nursing Facility | Payer: Medicare Other | Source: Other Acute Inpatient Hospital | Attending: Internal Medicine | Admitting: Internal Medicine

## 2018-07-13 DIAGNOSIS — R3981 Functional urinary incontinence: Secondary | ICD-10-CM | POA: Diagnosis not present

## 2018-07-13 DIAGNOSIS — R0902 Hypoxemia: Secondary | ICD-10-CM | POA: Diagnosis not present

## 2018-07-13 DIAGNOSIS — R2681 Unsteadiness on feet: Secondary | ICD-10-CM | POA: Diagnosis not present

## 2018-07-13 DIAGNOSIS — G4700 Insomnia, unspecified: Secondary | ICD-10-CM | POA: Diagnosis present

## 2018-07-13 DIAGNOSIS — I447 Left bundle-branch block, unspecified: Secondary | ICD-10-CM | POA: Diagnosis not present

## 2018-07-13 DIAGNOSIS — Z8249 Family history of ischemic heart disease and other diseases of the circulatory system: Secondary | ICD-10-CM

## 2018-07-13 DIAGNOSIS — G20 Parkinson's disease: Secondary | ICD-10-CM | POA: Diagnosis not present

## 2018-07-13 DIAGNOSIS — F329 Major depressive disorder, single episode, unspecified: Secondary | ICD-10-CM | POA: Diagnosis present

## 2018-07-13 DIAGNOSIS — N200 Calculus of kidney: Secondary | ICD-10-CM

## 2018-07-13 DIAGNOSIS — Z823 Family history of stroke: Secondary | ICD-10-CM

## 2018-07-13 DIAGNOSIS — F419 Anxiety disorder, unspecified: Secondary | ICD-10-CM | POA: Diagnosis present

## 2018-07-13 DIAGNOSIS — I69354 Hemiplegia and hemiparesis following cerebral infarction affecting left non-dominant side: Secondary | ICD-10-CM

## 2018-07-13 DIAGNOSIS — Z833 Family history of diabetes mellitus: Secondary | ICD-10-CM | POA: Diagnosis not present

## 2018-07-13 DIAGNOSIS — E7800 Pure hypercholesterolemia, unspecified: Secondary | ICD-10-CM

## 2018-07-13 DIAGNOSIS — M2550 Pain in unspecified joint: Secondary | ICD-10-CM | POA: Diagnosis not present

## 2018-07-13 DIAGNOSIS — G2581 Restless legs syndrome: Secondary | ICD-10-CM | POA: Diagnosis present

## 2018-07-13 DIAGNOSIS — J984 Other disorders of lung: Secondary | ICD-10-CM | POA: Diagnosis not present

## 2018-07-13 DIAGNOSIS — K219 Gastro-esophageal reflux disease without esophagitis: Secondary | ICD-10-CM | POA: Diagnosis present

## 2018-07-13 DIAGNOSIS — G629 Polyneuropathy, unspecified: Secondary | ICD-10-CM | POA: Diagnosis not present

## 2018-07-13 DIAGNOSIS — G20A1 Parkinson's disease without dyskinesia, without mention of fluctuations: Secondary | ICD-10-CM | POA: Diagnosis present

## 2018-07-13 DIAGNOSIS — R0602 Shortness of breath: Secondary | ICD-10-CM

## 2018-07-13 DIAGNOSIS — J9601 Acute respiratory failure with hypoxia: Secondary | ICD-10-CM | POA: Diagnosis present

## 2018-07-13 DIAGNOSIS — F0280 Dementia in other diseases classified elsewhere without behavioral disturbance: Secondary | ICD-10-CM | POA: Diagnosis not present

## 2018-07-13 DIAGNOSIS — I2541 Coronary artery aneurysm: Secondary | ICD-10-CM | POA: Diagnosis not present

## 2018-07-13 DIAGNOSIS — Z66 Do not resuscitate: Secondary | ICD-10-CM | POA: Diagnosis present

## 2018-07-13 DIAGNOSIS — E1140 Type 2 diabetes mellitus with diabetic neuropathy, unspecified: Secondary | ICD-10-CM | POA: Diagnosis not present

## 2018-07-13 DIAGNOSIS — J9600 Acute respiratory failure, unspecified whether with hypoxia or hypercapnia: Secondary | ICD-10-CM | POA: Diagnosis not present

## 2018-07-13 DIAGNOSIS — I6300 Cerebral infarction due to thrombosis of unspecified precerebral artery: Secondary | ICD-10-CM | POA: Diagnosis not present

## 2018-07-13 DIAGNOSIS — I2510 Atherosclerotic heart disease of native coronary artery without angina pectoris: Secondary | ICD-10-CM | POA: Diagnosis present

## 2018-07-13 DIAGNOSIS — J189 Pneumonia, unspecified organism: Secondary | ICD-10-CM | POA: Diagnosis not present

## 2018-07-13 DIAGNOSIS — Z7401 Bed confinement status: Secondary | ICD-10-CM | POA: Diagnosis not present

## 2018-07-13 DIAGNOSIS — I252 Old myocardial infarction: Secondary | ICD-10-CM

## 2018-07-13 DIAGNOSIS — N390 Urinary tract infection, site not specified: Secondary | ICD-10-CM | POA: Diagnosis present

## 2018-07-13 DIAGNOSIS — Z9181 History of falling: Secondary | ICD-10-CM | POA: Diagnosis not present

## 2018-07-13 DIAGNOSIS — E118 Type 2 diabetes mellitus with unspecified complications: Secondary | ICD-10-CM

## 2018-07-13 DIAGNOSIS — E1165 Type 2 diabetes mellitus with hyperglycemia: Secondary | ICD-10-CM | POA: Diagnosis not present

## 2018-07-13 DIAGNOSIS — M6281 Muscle weakness (generalized): Secondary | ICD-10-CM | POA: Diagnosis present

## 2018-07-13 DIAGNOSIS — U071 COVID-19: Secondary | ICD-10-CM | POA: Diagnosis present

## 2018-07-13 DIAGNOSIS — Z888 Allergy status to other drugs, medicaments and biological substances status: Secondary | ICD-10-CM

## 2018-07-13 DIAGNOSIS — E785 Hyperlipidemia, unspecified: Secondary | ICD-10-CM | POA: Diagnosis present

## 2018-07-13 DIAGNOSIS — S72012D Unspecified intracapsular fracture of left femur, subsequent encounter for closed fracture with routine healing: Secondary | ICD-10-CM | POA: Diagnosis not present

## 2018-07-13 DIAGNOSIS — G894 Chronic pain syndrome: Secondary | ICD-10-CM | POA: Diagnosis not present

## 2018-07-13 DIAGNOSIS — R293 Abnormal posture: Secondary | ICD-10-CM | POA: Diagnosis not present

## 2018-07-13 DIAGNOSIS — R1312 Dysphagia, oropharyngeal phase: Secondary | ICD-10-CM | POA: Diagnosis not present

## 2018-07-13 DIAGNOSIS — N3946 Mixed incontinence: Secondary | ICD-10-CM | POA: Diagnosis not present

## 2018-07-13 DIAGNOSIS — R296 Repeated falls: Secondary | ICD-10-CM

## 2018-07-13 DIAGNOSIS — IMO0002 Reserved for concepts with insufficient information to code with codable children: Secondary | ICD-10-CM | POA: Diagnosis present

## 2018-07-13 DIAGNOSIS — I10 Essential (primary) hypertension: Secondary | ICD-10-CM | POA: Diagnosis present

## 2018-07-13 DIAGNOSIS — E0842 Diabetes mellitus due to underlying condition with diabetic polyneuropathy: Secondary | ICD-10-CM

## 2018-07-13 DIAGNOSIS — J1282 Pneumonia due to coronavirus disease 2019: Secondary | ICD-10-CM | POA: Diagnosis present

## 2018-07-13 DIAGNOSIS — E119 Type 2 diabetes mellitus without complications: Secondary | ICD-10-CM | POA: Diagnosis present

## 2018-07-13 DIAGNOSIS — R4182 Altered mental status, unspecified: Secondary | ICD-10-CM | POA: Diagnosis not present

## 2018-07-13 DIAGNOSIS — J1289 Other viral pneumonia: Secondary | ICD-10-CM | POA: Diagnosis present

## 2018-07-13 DIAGNOSIS — F320 Major depressive disorder, single episode, mild: Secondary | ICD-10-CM

## 2018-07-13 DIAGNOSIS — Z87891 Personal history of nicotine dependence: Secondary | ICD-10-CM

## 2018-07-13 DIAGNOSIS — I639 Cerebral infarction, unspecified: Secondary | ICD-10-CM | POA: Diagnosis present

## 2018-07-13 DIAGNOSIS — R410 Disorientation, unspecified: Secondary | ICD-10-CM | POA: Diagnosis not present

## 2018-07-13 LAB — CBC WITH DIFFERENTIAL/PLATELET
Abs Immature Granulocytes: 0.05 10*3/uL (ref 0.00–0.07)
Basophils Absolute: 0 10*3/uL (ref 0.0–0.1)
Basophils Relative: 0 %
Eosinophils Absolute: 0 10*3/uL (ref 0.0–0.5)
Eosinophils Relative: 1 %
HCT: 42.5 % (ref 36.0–46.0)
Hemoglobin: 12.8 g/dL (ref 12.0–15.0)
Immature Granulocytes: 1 %
Lymphocytes Relative: 13 %
Lymphs Abs: 1 10*3/uL (ref 0.7–4.0)
MCH: 29 pg (ref 26.0–34.0)
MCHC: 30.1 g/dL (ref 30.0–36.0)
MCV: 96.2 fL (ref 80.0–100.0)
Monocytes Absolute: 1 10*3/uL (ref 0.1–1.0)
Monocytes Relative: 13 %
Neutro Abs: 6 10*3/uL (ref 1.7–7.7)
Neutrophils Relative %: 72 %
Platelets: 195 10*3/uL (ref 150–400)
RBC: 4.42 MIL/uL (ref 3.87–5.11)
RDW: 13.9 % (ref 11.5–15.5)
WBC: 8.1 10*3/uL (ref 4.0–10.5)
nRBC: 0 % (ref 0.0–0.2)

## 2018-07-13 LAB — GLUCOSE, CAPILLARY
Glucose-Capillary: 120 mg/dL — ABNORMAL HIGH (ref 70–99)
Glucose-Capillary: 126 mg/dL — ABNORMAL HIGH (ref 70–99)
Glucose-Capillary: 139 mg/dL — ABNORMAL HIGH (ref 70–99)
Glucose-Capillary: 158 mg/dL — ABNORMAL HIGH (ref 70–99)

## 2018-07-13 LAB — COMPREHENSIVE METABOLIC PANEL
ALT: 13 U/L (ref 0–44)
AST: 23 U/L (ref 15–41)
Albumin: 3.4 g/dL — ABNORMAL LOW (ref 3.5–5.0)
Alkaline Phosphatase: 87 U/L (ref 38–126)
Anion gap: 10 (ref 5–15)
BUN: 13 mg/dL (ref 8–23)
CO2: 29 mmol/L (ref 22–32)
Calcium: 8.7 mg/dL — ABNORMAL LOW (ref 8.9–10.3)
Chloride: 99 mmol/L (ref 98–111)
Creatinine, Ser: 0.7 mg/dL (ref 0.44–1.00)
GFR calc Af Amer: >60 mL/min (ref >60)
GFR calc non Af Amer: >60 mL/min (ref >60)
Glucose, Bld: 117 mg/dL — ABNORMAL HIGH (ref 70–99)
Potassium: 3.8 mmol/L (ref 3.5–5.1)
Sodium: 138 mmol/L (ref 135–145)
Total Bilirubin: 0.2 mg/dL — ABNORMAL LOW (ref 0.3–1.2)
Total Protein: 7.5 g/dL (ref 6.5–8.1)

## 2018-07-13 LAB — LIPID PANEL
Cholesterol: 125 mg/dL (ref 0–200)
HDL: 38 mg/dL — ABNORMAL LOW (ref >40)
LDL Cholesterol: 71 mg/dL (ref 0–99)
Total CHOL/HDL Ratio: 3.3 RATIO
Triglycerides: 80 mg/dL (ref <150)
VLDL: 16 mg/dL (ref 0–40)

## 2018-07-13 LAB — HEMOGLOBIN A1C
Hgb A1c MFr Bld: 7.1 % — ABNORMAL HIGH (ref 4.8–5.6)
Mean Plasma Glucose: 157.07 mg/dL

## 2018-07-13 LAB — BRAIN NATRIURETIC PEPTIDE: B Natriuretic Peptide: 14 pg/mL (ref 0.0–100.0)

## 2018-07-13 LAB — TYPE AND SCREEN
ABO/RH(D): A POS
Antibody Screen: NEGATIVE

## 2018-07-13 LAB — LACTIC ACID, PLASMA: Lactic Acid, Venous: 0.8 mmol/L (ref 0.5–1.9)

## 2018-07-13 LAB — SEDIMENTATION RATE: Sed Rate: 30 mm/hr — ABNORMAL HIGH (ref 0–22)

## 2018-07-13 LAB — MAGNESIUM: Magnesium: 1.8 mg/dL (ref 1.7–2.4)

## 2018-07-13 LAB — D-DIMER, QUANTITATIVE: D-Dimer, Quant: 0.67 ug/mL-FEU — ABNORMAL HIGH (ref 0.00–0.50)

## 2018-07-13 LAB — SARS CORONAVIRUS 2 BY RT PCR (HOSPITAL ORDER, PERFORMED IN ~~LOC~~ HOSPITAL LAB): SARS Coronavirus 2: POSITIVE — AB

## 2018-07-13 LAB — ABO/RH: ABO/RH(D): A POS

## 2018-07-13 LAB — C-REACTIVE PROTEIN: CRP: 6.3 mg/dL — ABNORMAL HIGH (ref <1.0)

## 2018-07-13 LAB — FERRITIN: Ferritin: 22 ng/mL (ref 11–307)

## 2018-07-13 LAB — MRSA PCR SCREENING: MRSA by PCR: NEGATIVE

## 2018-07-13 LAB — PROCALCITONIN: Procalcitonin: 0.1 ng/mL

## 2018-07-13 LAB — PROTIME-INR
INR: 1.1 (ref 0.8–1.2)
Prothrombin Time: 14.4 seconds (ref 11.4–15.2)

## 2018-07-13 LAB — TRIGLYCERIDES: Triglycerides: 83 mg/dL (ref <150)

## 2018-07-13 LAB — TROPONIN I: Troponin I: <0.03 ng/mL (ref <0.03)

## 2018-07-13 LAB — LACTATE DEHYDROGENASE: LDH: 120 U/L (ref 98–192)

## 2018-07-13 LAB — STREP PNEUMONIAE URINARY ANTIGEN: Strep Pneumo Urinary Antigen: NEGATIVE

## 2018-07-13 MED ORDER — ZINC SULFATE 220 (50 ZN) MG PO CAPS
220.0000 mg | ORAL_CAPSULE | Freq: Every day | ORAL | Status: DC
  Administered 2018-07-14 – 2018-07-17 (×4): 220 mg via ORAL
  Filled 2018-07-13 (×4): qty 1

## 2018-07-13 MED ORDER — VITAMIN C 500 MG PO TABS
500.0000 mg | ORAL_TABLET | Freq: Every day | ORAL | Status: DC
  Administered 2018-07-14 – 2018-07-17 (×4): 500 mg via ORAL
  Filled 2018-07-13 (×5): qty 1

## 2018-07-13 MED ORDER — LACTATED RINGERS IV SOLN
INTRAVENOUS | Status: DC
  Administered 2018-07-13 (×2): via INTRAVENOUS

## 2018-07-13 MED ORDER — ASPIRIN 81 MG PO CHEW
81.0000 mg | CHEWABLE_TABLET | Freq: Every day | ORAL | Status: DC
  Administered 2018-07-14 – 2018-07-17 (×4): 81 mg via ORAL
  Filled 2018-07-13 (×4): qty 1

## 2018-07-13 MED ORDER — ORAL CARE MOUTH RINSE
15.0000 mL | Freq: Two times a day (BID) | OROMUCOSAL | Status: DC
  Administered 2018-07-13 – 2018-07-17 (×9): 15 mL via OROMUCOSAL

## 2018-07-13 MED ORDER — PANTOPRAZOLE SODIUM 40 MG IV SOLR
40.0000 mg | INTRAVENOUS | Status: DC
  Administered 2018-07-13: 40 mg via INTRAVENOUS
  Filled 2018-07-13: qty 40

## 2018-07-13 MED ORDER — ASPIRIN 300 MG RE SUPP
150.0000 mg | Freq: Every day | RECTAL | Status: DC
  Administered 2018-07-13: 150 mg via RECTAL
  Filled 2018-07-13: qty 1

## 2018-07-13 MED ORDER — DULOXETINE HCL 20 MG PO CPEP
20.0000 mg | ORAL_CAPSULE | Freq: Every day | ORAL | Status: DC
  Administered 2018-07-14 – 2018-07-17 (×4): 20 mg via ORAL
  Filled 2018-07-13 (×8): qty 1

## 2018-07-13 MED ORDER — DOCUSATE SODIUM 100 MG PO CAPS
100.0000 mg | ORAL_CAPSULE | Freq: Every day | ORAL | Status: DC
  Administered 2018-07-13 – 2018-07-17 (×5): 100 mg via ORAL
  Filled 2018-07-13 (×5): qty 1

## 2018-07-13 MED ORDER — METHYLPREDNISOLONE SODIUM SUCC 125 MG IJ SOLR
60.0000 mg | Freq: Three times a day (TID) | INTRAMUSCULAR | Status: DC
  Administered 2018-07-13 (×2): 60 mg via INTRAVENOUS
  Filled 2018-07-13 (×2): qty 2

## 2018-07-13 MED ORDER — ENOXAPARIN SODIUM 40 MG/0.4ML ~~LOC~~ SOLN
40.0000 mg | SUBCUTANEOUS | Status: DC
  Administered 2018-07-14 – 2018-07-17 (×4): 40 mg via SUBCUTANEOUS
  Filled 2018-07-13 (×4): qty 0.4

## 2018-07-13 MED ORDER — SODIUM CHLORIDE 0.9 % IV SOLN
200.0000 mg | Freq: Once | INTRAVENOUS | Status: AC
  Administered 2018-07-13: 08:00:00 200 mg via INTRAVENOUS
  Filled 2018-07-13: qty 40

## 2018-07-13 MED ORDER — CLOPIDOGREL BISULFATE 75 MG PO TABS
75.0000 mg | ORAL_TABLET | Freq: Every day | ORAL | Status: DC
  Administered 2018-07-13 – 2018-07-17 (×5): 75 mg via ORAL
  Filled 2018-07-13 (×6): qty 1

## 2018-07-13 MED ORDER — SODIUM CHLORIDE 0.9 % IV SOLN
100.0000 mg | INTRAVENOUS | Status: AC
  Administered 2018-07-14 – 2018-07-17 (×4): 100 mg via INTRAVENOUS
  Filled 2018-07-13 (×4): qty 20

## 2018-07-13 MED ORDER — ATORVASTATIN CALCIUM 40 MG PO TABS
40.0000 mg | ORAL_TABLET | Freq: Every day | ORAL | Status: DC
  Administered 2018-07-13 – 2018-07-16 (×4): 40 mg via ORAL
  Filled 2018-07-13 (×4): qty 1

## 2018-07-13 MED ORDER — METHYLPREDNISOLONE SODIUM SUCC 125 MG IJ SOLR
60.0000 mg | Freq: Two times a day (BID) | INTRAMUSCULAR | Status: DC
  Administered 2018-07-14: 60 mg via INTRAVENOUS
  Filled 2018-07-13: qty 2

## 2018-07-13 MED ORDER — HALOPERIDOL LACTATE 5 MG/ML IJ SOLN: 1.0000 mg | Freq: Four times a day (QID) | INTRAMUSCULAR | Status: DC | PRN

## 2018-07-13 MED ORDER — PANTOPRAZOLE SODIUM 40 MG PO TBEC
40.0000 mg | DELAYED_RELEASE_TABLET | Freq: Every day | ORAL | Status: DC
  Administered 2018-07-14 – 2018-07-17 (×4): 40 mg via ORAL
  Filled 2018-07-13 (×4): qty 1

## 2018-07-13 MED ORDER — ENOXAPARIN SODIUM 80 MG/0.8ML ~~LOC~~ SOLN
1.0000 mg/kg | Freq: Two times a day (BID) | SUBCUTANEOUS | Status: DC
  Administered 2018-07-13: 70 mg via SUBCUTANEOUS
  Filled 2018-07-13: qty 0.8

## 2018-07-13 MED ORDER — CLONAZEPAM 0.5 MG PO TBDP
0.5000 mg | ORAL_TABLET | Freq: Two times a day (BID) | ORAL | Status: DC
  Administered 2018-07-13 – 2018-07-17 (×9): 0.5 mg via ORAL
  Filled 2018-07-13 (×9): qty 1

## 2018-07-13 MED ORDER — NITROGLYCERIN 0.4 MG SL SUBL: 0.4000 mg | SUBLINGUAL_TABLET | SUBLINGUAL | Status: DC

## 2018-07-13 MED ORDER — LACTATED RINGERS IV SOLN
INTRAVENOUS | Status: DC
  Administered 2018-07-13: 12:00:00 via INTRAVENOUS

## 2018-07-13 MED ORDER — VITAMIN C 500 MG PO TABS
500.0000 mg | ORAL_TABLET | Freq: Every day | ORAL | Status: DC
  Administered 2018-07-13: 500 mg via ORAL

## 2018-07-13 MED ORDER — SODIUM CHLORIDE 0.9 % IV SOLN: 100.0000 mg | INTRAVENOUS | Status: DC

## 2018-07-13 MED ORDER — SODIUM CHLORIDE 0.9 % IV SOLN
2.0000 g | INTRAVENOUS | Status: DC
  Administered 2018-07-13 – 2018-07-14 (×2): 2 g via INTRAVENOUS
  Filled 2018-07-13 (×2): qty 2

## 2018-07-13 MED ORDER — HYDRALAZINE HCL 20 MG/ML IJ SOLN: 10.0000 mg | Freq: Four times a day (QID) | INTRAMUSCULAR | Status: DC | PRN

## 2018-07-13 MED ORDER — ACETAMINOPHEN 325 MG PO TABS: 650.0000 mg | ORAL_TABLET | Freq: Four times a day (QID) | ORAL | Status: DC | PRN

## 2018-07-13 MED ORDER — ONDANSETRON HCL 4 MG PO TABS: 4.0000 mg | ORAL_TABLET | Freq: Four times a day (QID) | ORAL | Status: DC | PRN

## 2018-07-13 MED ORDER — INSULIN ASPART 100 UNIT/ML ~~LOC~~ SOLN
0.0000 [IU] | SUBCUTANEOUS | Status: DC
  Administered 2018-07-13 (×3): 1 [IU] via SUBCUTANEOUS
  Administered 2018-07-13: 17:00:00 2 [IU] via SUBCUTANEOUS
  Administered 2018-07-14: 1 [IU] via SUBCUTANEOUS

## 2018-07-13 MED ORDER — ONDANSETRON HCL 4 MG/2ML IJ SOLN: 4.0000 mg | Freq: Four times a day (QID) | INTRAMUSCULAR | Status: DC | PRN

## 2018-07-13 MED ORDER — HEPARIN SODIUM (PORCINE) 5000 UNIT/ML IJ SOLN: 5000.0000 [IU] | Freq: Three times a day (TID) | INTRAMUSCULAR | Status: DC

## 2018-07-13 MED ORDER — IPRATROPIUM-ALBUTEROL 20-100 MCG/ACT IN AERS
1.0000 | INHALATION_SPRAY | Freq: Four times a day (QID) | RESPIRATORY_TRACT | Status: DC
  Administered 2018-07-13 – 2018-07-16 (×8): 1 via RESPIRATORY_TRACT
  Filled 2018-07-13: qty 4

## 2018-07-13 NOTE — Progress Notes (Signed)
ANTICOAGULATION CONSULT NOTE - Initial Consult  Pharmacy Consult for enoxaparin Indication: VTE treatment   Allergies not on file  Patient Measurements: Weight: 148 lb 14.4 oz (67.5 kg) Heparin Dosing Weight:   Vital Signs: Temp: 98.8 F (37.1 C) (06/01 0353) Temp Source: Oral (06/01 0353) BP: 101/54 (06/01 0600) Pulse Rate: 87 (06/01 0600)  Labs: No results for input(s): HGB, HCT, PLT, APTT, LABPROT, INR, HEPARINUNFRC, HEPRLOWMOCWT, CREATININE, CKTOTAL, CKMB, TROPONINI in the last 72 hours.  CrCl cannot be calculated (No successful lab value found.).   Medical History: No past medical history on file.  Medications:  Scheduled:  . enoxaparin (LOVENOX) injection  1 mg/kg Subcutaneous Q12H  . insulin aspart  0-9 Units Subcutaneous Q4H  . Ipratropium-Albuterol  1 puff Inhalation Q6H  . methylPREDNISolone (SOLU-MEDROL) injection  60 mg Intravenous Q8H  . vitamin C  500 mg Oral Daily  . zinc sulfate  220 mg Oral Daily    Assessment: Pharmacy is consulted to dose enoxaparin in 73 yo female that's is COVID-19 positive. Per notes from [facility]  Pt D-dimer I 763 and Scr is 0.70. Spoke to Dr. Joseph Art who confirmed to dose 1 mg/kg BID for now pending new labs from [facility].   Goal of Therapy:  Anti-Xa level 0.6-1 units/ml 4hrs after LMWH dose given Monitor platelets by anticoagulation protocol: Yes   Plan:  Enoxaparin 70 mg SQ q12h Monitor SCr/kidney function Monitor for signs and symptoms of bleeding    Adalberto Cole, PharmD, BCPS 07/13/2018 6:40 AM

## 2018-07-13 NOTE — Progress Notes (Signed)
Patient awake and alert now.  Patient only oriented to self.  Patient able to talk to family on the phone, and family was updated by nurse.

## 2018-07-13 NOTE — Progress Notes (Signed)
CRITICAL VALUE ALERT  Critical Value:  covid +  Date & Time Notied:  07/13/2018 @ 0907  Provider Notified: Dr. Thedore Mins  Orders Received/Actions taken: awaiting

## 2018-07-13 NOTE — H&P (Signed)
[facility] History and Physical  Jody Young:[id number] DOB: 1945-06-19 DOA: 07/13/2018  Referring physician:  PCP: No primary care provider on file.   Chief Complaint: COVID pneumonia, S OB, altered mental status  HPI: Jody Young is a 73 y.o. WF PMHx CVA, Parkinson's disease, major depressive disorder, anxiety, MI x2, essential HTN, CAD on Plavix, Diabetes type 2 with complications (neuropathy), HLD, nephrolithiasis, muscle weakness, Hx of falling, functional urinary incontinence, insomnia, chronic pain syndrome, GERD, RLS  Resented to [facility] from local skilled nursing facility with fever, cough, S OB, altered mental status.  Per EMS report patient began having cough yesterday, this evening staff reported that she had increased difficulty in breathing with fever as high as 104 and altered mental status.  EMS reports they found patient to be hypoxic on arrival with decreased level of consciousness.  On arrival to the ED [facility] ED patient responded to her name but does not answer questions, she is a resident of a local facility with COVID-19 outbreak. Upon arrival to [facility] patient minimally responsive to painful stimuli.  Unresponsive otherwise.  Unable to obtain further history.  D-dimer= 763, troponin I 0.01, LDH 269, CRP 28, ferritin 19.6, procalcitonin 0.10, ferritin 19.6  ABG: pH 7.39, PCO2 54, PO2 230 Urinalysis cloudy, WBC 40-50, bacteria 4+,   Review of Systems:  Constitutional:  No weight loss, night sweats, positive fevers, negative chills, fatigue.  HEENT:  No headaches, Difficulty swallowing,Tooth/dental problems,Sore throat,  No sneezing, itching, ear ache, nasal congestion, post nasal drip,  Cardio-vascular:  No chest pain, Orthopnea, PND, swelling in lower extremities, anasarca, dizziness, palpitations  GI:  No heartburn, indigestion, abdominal pain, nausea, vomiting, diarrhea, change in bowel habits, loss of appetite  Resp:  positive shortness  of breath with exertion or at rest. No excess mucus, no productive cough, No non-productive cough, No coughing up of blood.No change in color of mucus.No wheezing.No chest wall deformity  Skin:  no rash or lesions.  GU:  no dysuria, change in color of urine, no urgency or frequency. No flank pain.  Musculoskeletal:  No joint pain or swelling. No decreased range of motion. No back pain.  Psych:  No change in mood or affect. No depression or anxiety. No memory loss.   PMHx see below  Social History: Former smoker  Allergies not on file  No family history on file.  HTN (parents, sister), diabetes (grandfather), stroke (mother), cardiac disorder (father, sister)  Prior to Admission medications   Not on File     Consultants:  None  Procedures/Significant Events:  None  I have personally reviewed and interpreted all radiology studies and my findings are as above.   VENTILATOR SETTINGS: None   Cultures 5/31 SARS COVID positive: At [facility] 6/1 SARS pending 6/1 respiratory virus panel pending  6/1 blood pending 6/1 urine pending     Antimicrobials: Anti-infectives (From admission, onward)   Start     Ordered Stop   07/13/18 0600  cefTRIAXone (ROCEPHIN) 2 g in sodium chloride 0.9 % 100 mL IVPB     07/13/18 0534         Devices None   LINES / TUBES:  PIV    Continuous Infusions:  Physical Exam: Vitals:   07/13/18 0353  BP: (!) 116/54  Pulse: (!) 102  Resp: (!) 26  Temp: 98.8 F (37.1 C)  TempSrc: Oral  SpO2: 91%    Wt Readings from Last 3 Encounters:  No data found for [employer]  General: A/O x0, positive acute respiratory distress Eyes: negative scleral hemorrhage, negative anisocoria, negative icterus ENT: Negative Runny nose, negative gingival bleeding, dry mucous membranes Neck:  Negative scars, masses, torticollis, lymphadenopathy, JVD Lungs: Tachypneic, clear to auscultation bilaterally without wheezes or crackles Cardiovascular:  Tachycardic, without murmur gallop or rub normal S1 and S2 Abdomen: negative abdominal pain, nondistended, positive soft, bowel sounds, no rebound, no ascites, no appreciable mass Extremities: No significant cyanosis, clubbing, or edema bilateral lower extremities Skin: Negative rashes, lesions, ulcers Psychiatric: Unable to assess secondary to altered mental status Central nervous system: Minimal withdrawal to painful stimuli unable to assess further secondary to altered mental status         Labs on Admission:  Basic Metabolic Panel: No results for input(s): NA, K, CL, CO2, GLUCOSE, BUN, CREATININE, CALCIUM, MG, PHOS in the last 168 hours. Liver Function Tests: No results for input(s): AST, ALT, ALKPHOS, BILITOT, PROT, ALBUMIN in the last 168 hours. No results for input(s): LIPASE, AMYLASE in the last 168 hours. No results for input(s): AMMONIA in the last 168 hours. CBC: No results for input(s): WBC, NEUTROABS, HGB, HCT, MCV, PLT in the last 168 hours. Cardiac Enzymes: No results for input(s): CKTOTAL, CKMB, CKMBINDEX, TROPONINI in the last 168 hours.  BNP (last 3 results) No results for input(s): BNP in the last 8760 hours.  ProBNP (last 3 results) No results for input(s): PROBNP in the last 8760 hours.  CBG: No results for input(s): GLUCAP in the last 168 hours.  Radiological Exams on Admission: Portable Chest 1 View  Result Date: 07/13/2018 CLINICAL DATA:  73 year old female shortness of breath.  COVID-19. EXAM: PORTABLE CHEST 1 VIEW COMPARISON:  07/12/2018 and earlier. FINDINGS: Portable AP semi upright view at 0457 hours. Lung volumes and mediastinal contours remain within normal limits. Calcified aortic atherosclerosis. Visualized tracheal air column is within normal limits. Allowing for portable technique the lungs are clear. Paucity of bowel gas in the upper abdomen. No acute osseous abnormality identified. IMPRESSION: No acute cardiopulmonary abnormality. Electronically  Signed   By: Odessa Fleming M.D.   On: 07/13/2018 06:05    EKG:  Pending  Assessment/Plan Active Problems:   Pneumonia due to COVID-19 virus   CVA (cerebral vascular accident) (HCC)   Dementia due to Parkinson's disease without behavioral disturbance (HCC)   Major depressive disorder   Anxiety   History of myocardial infarction   Essential hypertension   Diabetes mellitus type 2, uncontrolled, with complications (HCC)   Diabetic neuropathy (HCC)   HLD (hyperlipidemia)   Nephrolithiasis   Generalized muscle weakness   Falls frequently   Functional urinary incontinence   Insomnia   Chronic pain syndrome   GERD (gastroesophageal reflux disease)   Restless leg syndrome   Acute lower UTI   Acute respiratory failure with hypoxia (HCC)   Pneumonia due to COVID-19 virus -Patient admitted from [facility] with new O2 demand, and altered mental status.    - 5/31 COVID 19 positive - Solu-Medrol 60 mg daily - Remedesivir per pharmacy - Titrate O2 to maintain SPO2 greater than 90% - Combivent 6 hours - All appropriate daily COVID 19 labs pending - PCXR pending - Flutter valve when patient's mental status improved -Placed on full dose Lovenox given patient's d-dimer of 763  Acute respiratory with hypoxia  - See pneumonia  Altered mental status/Hx CVA - Multifactorial most likely secondary to COVID-19 pneumonia, UTI, new CVA (less likely) - Treat underlying causes  Dementia due to Parkinson's disease - Parkinson's  medication on hold has not been reconciled by pharmacy. - Will need to be restarted once reconciled by pharmacy.  Patient will need placement of core track tube.  Depression/anxiety - Currently with altered mental status will hold all sedating medication.  Essential HTN/MI x2 -Patient hypotensive on admission, hold all home BP medication. - Gentle hydration normal saline 3575ml/hr - Strict in and out -Daily weight -Trend troponin, EKG pending.  Unknown cardiac  function  Diabetes type 2 uncontrolled with complication/diabetic neuropathy - Sensitive SSI may need to be increased given patient's on steroids. - Hemoglobin A1c pending - Lipid panel pending -See chronic pain  Chronic pain syndrome - Hold all home chronic pain medication and neuropathic medication given patient's altered mental status.  Restless leg syndrome   UTI - Urine culture pending - Given patient's urinalysis at outside hospital have started empiric UTI coverage.     Code Status: DNR (DVT Prophylaxis: Full dose Lovenox given patient's d-dimer of 763 Family Communication: No Disposition Plan: TBD   Data Reviewed: Care during the described time interval was provided by me .  I have reviewed this patient's available data, including medical history, events of note, physical examination, and all test results as part of my evaluation.   The patient is critically ill with multiple organ systems failure and requires high complexity decision making for assessment and support, frequent evaluation and titration of therapies, application of advanced monitoring technologies and extensive interpretation of multiple databases. Critical Care Time devoted to patient care services described in this note  Time spent: 70 minutes   , Roselind MessierCURTIS J [facility] Pager [phone]

## 2018-07-13 NOTE — Evaluation (Signed)
Clinical/Bedside Swallow Evaluation Patient Details  Name: Jody Young MRN: [id number] Date of Birth: April 08, 1945  Today's Date: 07/13/2018 Time: SLP Start Time (ACUTE ONLY): 1430 SLP Stop Time (ACUTE ONLY): 1510 SLP Time Calculation (min) (ACUTE ONLY): 40 min  Past Medical History: No past medical history on file. Past Surgical History: The histories are not reviewed yet. Please review them in the "History" navigator section and refresh this SmartLink. HPI:  Pt is a 73 yo female admitted from SNF with fever and hypoxia secondary to COVID-19. PMH includes Parkinson's disease, multiple CVAs with residual left-sided hemiparesis, dysarthria, dysphagia (no prior records available), depression, MI x2, HTN, CAD, DM II, neuropathy, dyslipidemia, nephrolithiasis, chronic pain, and GERD.    Assessment / Plan / Recommendation Clinical Impression  Pt initially appeared to have a functional oropharyngeal swallow, but upon finishing a cup of pudding she started to verbalize that she needed to cough. Volitional cough could not be elicited despite cues, until she started to cough spontaneously. Coughing was explosive and prolonged, with pt turning red in the face. She only expectorated a small amount of thick sputum though. She tried a few more sips of water without incidence, but upon trying a piece of graham cracker, she began to have a similar coughing episode. Question if this could be related to her baseline cough or an esophageal component, but she certainly has multiple risk factors for pharyngeal dysphagia as well (prior CVA, Parkinson's disease, h/o dysphagia s/p CVA although self-reported to be resolved). She also likely has decreased functional reserve in light of acute illness. For today, would recommend NPO except for meds crushed in puree and small sips of water after oral care. Will f/u for readiness to begin a PO diet. SLP Visit Diagnosis: Dysphagia, unspecified (R13.10)    Aspiration Risk   Moderate aspiration risk    Diet Recommendation NPO except meds;Free water protocol after oral care   Liquid Administration via: Spoon;Cup;Straw Medication Administration: Crushed with puree    Other  Recommendations Oral Care Recommendations: Oral care QID;Oral care prior to ice chip/H20   Follow up Recommendations Skilled Nursing facility      Frequency and Duration min 2x/week  2 weeks       Prognosis Prognosis for Safe Diet Advancement: Good      Swallow Study   General HPI: Pt is a 73 yo female admitted from SNF with fever and hypoxia secondary to COVID-19. PMH includes Parkinson's disease, multiple CVAs with residual left-sided hemiparesis, dysarthria, dysphagia (no prior records available), depression, MI x2, HTN, CAD, DM II, neuropathy, dyslipidemia, nephrolithiasis, chronic pain, and GERD.  Type of Study: Bedside Swallow Evaluation Previous Swallow Assessment: none in chart, but h/o dysphagia per SNF records Diet Prior to this Study: NPO Temperature Spikes Noted: No Respiratory Status: Nasal cannula History of Recent Intubation: No Behavior/Cognition: Alert;Cooperative;Other (Comment)("nervous") Oral Cavity Assessment: Within Functional Limits Oral Care Completed by SLP: No Oral Cavity - Dentition: Dentures, top;Dentures, bottom Vision: Functional for self-feeding Self-Feeding Abilities: Needs assist(tremor) Patient Positioning: Upright in bed Baseline Vocal Quality: Normal Volitional Cough: Cognitively unable to elicit Volitional Swallow: Able to elicit    Oral/Motor/Sensory Function Overall Oral Motor/Sensory Function: (mild L-sided weakness consistent with hx)   Ice Chips Ice chips: Within functional limits Presentation: Spoon   Thin Liquid Thin Liquid: Within functional limits Presentation: Cup;Self Fed;Spoon;Straw    Nectar Thick Nectar Thick Liquid: Not tested   Honey Thick Honey Thick Liquid: Not tested   Puree Puree: Impaired Presentation:  Spoon Pharyngeal Phase  Impairments: Cough - Delayed;Change in Vital Signs   Solid     Solid: Impaired Presentation: Self Fed Pharyngeal Phase Impairments: Cough - Delayed      Virl Axe Catarina Huntley 07/13/2018,5:19 PM  Ivar Drape, M.A. CCC-SLP Acute Herbalist [phone] Office [phone]

## 2018-07-13 NOTE — Progress Notes (Signed)
PROGRESS NOTE                                                                                                                                                                                                             Patient Demographics:    Jody Young, is a 73 y.o. female, DOB - 02-15-1945, ZOX:[id number]  Outpatient Primary MD for the patient is No primary care provider on file.    LOS - 0  Admit date - 07/13/2018    CC Fever     Brief Narrative   73 year old elderly female who is a nursing home resident in [place] for the last 2 years with past medical history of Parkinson's disease, 2 major CVAs with left-sided hemiparesis, depression, anxiety, MI x2, essential hypertension, CAD, DM type II, diabetic peripheral neuropathy, dyslipidemia, nephrolithiasis, chronic pain, GERD who was found to be febrile and hypoxic at SNF, was diagnosed with COVID-19 infection and transferred to [facility].   Subjective:    Jody Young today In bed in no distress but overall quite somnolent, now answering a few questions, followed a few basic commands.   Assessment  & Plan :      1. Acute Hypoxic Resp. Failure due to Acute Covid 19 Viral Illness during the ongoing 2020 Covid 19 Pandemic -  She is currently in no respiratory distress, inflammatory markers are stable, currently on combination of REMDESIVIR and Solu-Medrol which will be continued.  Continue supplemental oxygen along with albuterol inhalations as needed, once mentation has improved will encourage her to sit up in chair and use I-S and flutter valve.  Monitor closely.   COVID-19 Labs  Recent Labs    07/13/18 0431 07/13/18 0620  DDIMER  --  0.67*  FERRITIN 22  --   LDH  --  120  CRP 6.3*  --     Lab Results  Component Value Date   SARSCOV2NAA POSITIVE (A) 07/13/2018        Component Value Date/Time   BNP 14.0 07/13/2018 0620      2.  History of CVA, MI with  dense left-sided hemiparesis - currently n.p.o. due to somnolence, aspirin suppository, once she is more arousable Home Meds will be resumed.  PT and speech to evaluate.  N.p.o. for now.  3.  HTN.  For now PRN IV hydralazine.  4.  Chronic pain.  Hold narcotics.  5.  Underlying Parkinson's.  Will review and resume medications as soon as possible.  6.  Suprapubic fullness.  Place Foley, check UA rule out UTI.    7. DM type II.  A1c pending.  SSI for now.  Lab Results  Component Value Date   HGBA1C 7.1 (H) 07/13/2018   CBG (last 3)  Recent Labs    07/13/18 0628 07/13/18 0854  GLUCAP 120* 126*         Condition - Extremely Guarded  Family Communication  : discussed with husband on 07/13/2018, DNR.  Continue medical treatment.  Code Status : DNR  Diet : NPO  Disposition Plan  :  SNF  Consults  :  None  Procedures  :    PUD Prophylaxis : PPI  DVT Prophylaxis  :  Lovenox    Lab Results  Component Value Date   PLT 195 07/13/2018    Inpatient Medications  Scheduled Meds: . enoxaparin (LOVENOX) injection  1 mg/kg Subcutaneous Q12H  . insulin aspart  0-9 Units Subcutaneous Q4H  . Ipratropium-Albuterol  1 puff Inhalation Q6H  . mouth rinse  15 mL Mouth Rinse BID  . methylPREDNISolone (SOLU-MEDROL) injection  60 mg Intravenous Q8H  . vitamin C  500 mg Oral Daily  . zinc sulfate  220 mg Oral Daily   Continuous Infusions: . cefTRIAXone (ROCEPHIN)  IV 2 g (07/13/18 0604)  . lactated ringers    . lactated ringers    . [START ON 07/14/2018] remdesivir 100 mg in NS 250 mL     PRN Meds:.acetaminophen, [DISCONTINUED] ondansetron **OR** ondansetron (ZOFRAN) IV  Antibiotics  :    Anti-infectives (From admission, onward)   Start     Dose/Rate Route Frequency Ordered Stop   07/14/18 0800  remdesivir 100 mg in sodium chloride 0.9 % 230 mL IVPB     100 mg 500 mL/hr over 30 Minutes Intravenous Every 24 hours 07/13/18 0633     07/13/18 0800  remdesivir 200 mg in sodium  chloride 0.9 % 210 mL IVPB     200 mg 500 mL/hr over 30 Minutes Intravenous Once 07/13/18 [id number] 07/13/18 0812   07/13/18 0600  cefTRIAXone (ROCEPHIN) 2 g in sodium chloride 0.9 % 100 mL IVPB     2 g 200 mL/hr over 30 Minutes Intravenous Every 24 hours 07/13/18 0534         Time Spent in minutes  30   Susa RaringPrashant Akul Leggette M.D on 07/13/2018 at 10:26 AM  To page go to [email or web address] - password [email or web address]  [facility] -  Office  [phone]    See all Orders from today for further details    Objective:   Vitals:   07/13/18 0626 07/13/18 0744 07/13/18 0800 07/13/18 0855  BP:  100/70 (!) 114/53   Pulse:      Resp:      Temp:    98.5 F (36.9 C)  TempSrc:      SpO2:    94%  Weight: 67.5 kg       Wt Readings from Last 3 Encounters:  07/13/18 67.5 kg     Intake/Output Summary (Last 24 hours) at 07/13/2018 1026 Last data filed at 07/13/2018 0647 Gross per 24 hour  Intake 0 ml  Output 301 ml  Net -301 ml     Physical Exam  Somnolent  with dense left-sided hemiparesis from previous stroke, chronic contractures on the left side, [place].AT,PERRAL Supple Neck,No JVD, No cervical lymphadenopathy appriciated.  Symmetrical Chest wall movement, Good air movement bilaterally, CTAB RRR,No Gallops,Rubs or new Murmurs, No Parasternal Heave +ve B.Sounds, Abd Soft, No tenderness, No organomegaly appriciated, No rebound - guarding or rigidity. No Cyanosis, Clubbing or edema, No new Rash or bruise     Data Review:    CBC Recent Labs  Lab 07/13/18 0620  WBC 8.1  HGB 12.8  HCT 42.5  PLT 195  MCV 96.2  MCH 29.0  MCHC 30.1  RDW 13.9  LYMPHSABS 1.0  MONOABS 1.0  EOSABS 0.0  BASOSABS 0.0    Chemistries  Recent Labs  Lab 07/13/18 0620  NA 138  K 3.8  CL 99  CO2 29  GLUCOSE 117*  BUN 13  CREATININE 0.70  CALCIUM 8.7*  MG 1.8  AST 23  ALT 13  ALKPHOS 87  BILITOT 0.2*    ------------------------------------------------------------------------------------------------------------------ Recent Labs    07/13/18 0620  CHOL 125  HDL 38*  LDLCALC 71  TRIG 80  83  CHOLHDL 3.3    Lab Results  Component Value Date   HGBA1C 7.1 (H) 07/13/2018   ------------------------------------------------------------------------------------------------------------------ No results for input(s): TSH, T4TOTAL, T3FREE, THYROIDAB in the last 72 hours.  Invalid input(s): FREET3  Cardiac Enzymes Recent Labs  Lab 07/13/18 0432  TROPONINI <0.03   ------------------------------------------------------------------------------------------------------------------    Component Value Date/Time   BNP 14.0 07/13/2018 0620    Micro Results Recent Results (from the past 240 hour(s))  SARS Coronavirus 2 [facility] order, Performed in [facility] hospital lab)     Status: Abnormal   Collection Time: 07/13/18  6:55 AM  Result Value Ref Range Status   SARS Coronavirus 2 POSITIVE (A) NEGATIVE Final    Comment: RESULT CALLED TO, READ BACK BY AND VERIFIED WITH: K.DUFFY,RN [id number]  BY V.WILKINS (NOTE) If result is NEGATIVE SARS-CoV-2 target nucleic acids are NOT DETECTED. The SARS-CoV-2 RNA is generally detectable in upper and lower  respiratory specimens during the acute phase of infection. The lowest  concentration of SARS-CoV-2 viral copies this assay can detect is 250  copies / mL. A negative result does not preclude SARS-CoV-2 infection  and should not be used as the sole basis for treatment or other  patient management decisions.  A negative result may occur with  improper specimen collection / handling, submission of specimen other  than nasopharyngeal swab, presence of viral mutation(s) within the  areas targeted by this assay, and inadequate number of viral copies  (<250 copies / mL). A negative result must be combined with clinical  observations, patient history,  and epidemiological information. If result is POSITIVE SARS-CoV-2 target nucleic acids are DETECTED.  The SARS-CoV-2 RNA is generally detectable in upper and lower  respiratory specimens during the acute phase of infection.  Positive  results are indicative of active infection with SARS-CoV-2.  Clinical  correlation with patient history and other diagnostic information is  necessary to determine patient infection status.  Positive results do  not rule out bacterial infection or co-infection with other viruses. If result is PRESUMPTIVE POSTIVE SARS-CoV-2 nucleic acids MAY BE PRESENT.   A presumptive positive result was obtained on the submitted specimen  and confirmed on repeat testing.  While 2019 novel coronavirus  (SARS-CoV-2) nucleic acids may be present in the submitted sample  additional confirmatory testing may be necessary for epidemiological  and / or clinical management purposes  to differentiate between  SARS-CoV-2 and other Sarbecovirus currently known to infect humans.  If clinically indicated additional testing with an alternate test  methodology [phone]) i s advised. The SARS-CoV-2 RNA is generally  detectable in upper and lower respiratory specimens during the acute  phase of infection. The expected result is Negative. Fact Sheet for Patients:  BoilerBrush.com.cy Fact Sheet for Healthcare Providers: https://pope.com/ This test is not yet approved or cleared by the [place] FDA and has been authorized for detection and/or diagnosis of SARS-CoV-2 by FDA under an Emergency Use Authorization (EUA).  This EUA will remain in effect (meaning this test can be used) for the duration of the COVID-19 declaration under Section 564(b)(1) of the Act, 21 U.S.C. section 360bbb-3(b)(1), unless the authorization is terminated or revoked sooner. Performed at [facility], [address], [place], [place] [address]      Radiology Reports Portable Chest 1 View  Result Date: 07/13/2018 CLINICAL DATA:  73 year old female shortness of breath.  COVID-19. EXAM: PORTABLE CHEST 1 VIEW COMPARISON:  07/12/2018 and earlier. FINDINGS: Portable AP semi upright view at 0457 hours. Lung volumes and mediastinal contours remain within normal limits. Calcified aortic atherosclerosis. Visualized tracheal air column is within normal limits. Allowing for portable technique the lungs are clear. Paucity of bowel gas in the upper abdomen. No acute osseous abnormality identified. IMPRESSION: No acute cardiopulmonary abnormality. Electronically Signed   By: Odessa Fleming M.D.   On: 07/13/2018 06:05

## 2018-07-14 ENCOUNTER — Inpatient Hospital Stay (HOSPITAL_COMMUNITY): Payer: Medicare Other

## 2018-07-14 LAB — MAGNESIUM: Magnesium: 1.7 mg/dL (ref 1.7–2.4)

## 2018-07-14 LAB — URINALYSIS, ROUTINE W REFLEX MICROSCOPIC
Bilirubin Urine: NEGATIVE
Glucose, UA: >=500 mg/dL — AB
Ketones, ur: 5 mg/dL — AB
Nitrite: NEGATIVE
Protein, ur: NEGATIVE mg/dL
Specific Gravity, Urine: 1.017 (ref 1.005–1.030)
pH: 5 (ref 5.0–8.0)

## 2018-07-14 LAB — COMPREHENSIVE METABOLIC PANEL
ALT: 26 U/L (ref 0–44)
AST: 40 U/L (ref 15–41)
Albumin: 3.5 g/dL (ref 3.5–5.0)
Alkaline Phosphatase: 84 U/L (ref 38–126)
Anion gap: 11 (ref 5–15)
BUN: 16 mg/dL (ref 8–23)
CO2: 30 mmol/L (ref 22–32)
Calcium: 9.1 mg/dL (ref 8.9–10.3)
Chloride: 100 mmol/L (ref 98–111)
Creatinine, Ser: 0.58 mg/dL (ref 0.44–1.00)
GFR calc Af Amer: >60 mL/min (ref >60)
GFR calc non Af Amer: >60 mL/min (ref >60)
Glucose, Bld: 94 mg/dL (ref 70–99)
Potassium: 4 mmol/L (ref 3.5–5.1)
Sodium: 141 mmol/L (ref 135–145)
Total Bilirubin: 0.5 mg/dL (ref 0.3–1.2)
Total Protein: 7.8 g/dL (ref 6.5–8.1)

## 2018-07-14 LAB — D-DIMER, QUANTITATIVE: D-Dimer, Quant: 0.78 ug/mL-FEU — ABNORMAL HIGH (ref 0.00–0.50)

## 2018-07-14 LAB — CBC WITH DIFFERENTIAL/PLATELET
Abs Immature Granulocytes: 0.02 10*3/uL (ref 0.00–0.07)
Basophils Absolute: 0 10*3/uL (ref 0.0–0.1)
Basophils Relative: 0 %
Eosinophils Absolute: 0 10*3/uL (ref 0.0–0.5)
Eosinophils Relative: 0 %
HCT: 43.2 % (ref 36.0–46.0)
Hemoglobin: 13.1 g/dL (ref 12.0–15.0)
Immature Granulocytes: 1 %
Lymphocytes Relative: 16 %
Lymphs Abs: 0.7 10*3/uL (ref 0.7–4.0)
MCH: 28.6 pg (ref 26.0–34.0)
MCHC: 30.3 g/dL (ref 30.0–36.0)
MCV: 94.3 fL (ref 80.0–100.0)
Monocytes Absolute: 1.1 10*3/uL — ABNORMAL HIGH (ref 0.1–1.0)
Monocytes Relative: 26 %
Neutro Abs: 2.3 10*3/uL (ref 1.7–7.7)
Neutrophils Relative %: 57 %
Platelets: 209 10*3/uL (ref 150–400)
RBC: 4.58 MIL/uL (ref 3.87–5.11)
RDW: 13.6 % (ref 11.5–15.5)
WBC: 4 10*3/uL (ref 4.0–10.5)
nRBC: 0 % (ref 0.0–0.2)

## 2018-07-14 LAB — GLUCOSE, CAPILLARY
Glucose-Capillary: 111 mg/dL — ABNORMAL HIGH (ref 70–99)
Glucose-Capillary: 127 mg/dL — ABNORMAL HIGH (ref 70–99)
Glucose-Capillary: 144 mg/dL — ABNORMAL HIGH (ref 70–99)
Glucose-Capillary: 185 mg/dL — ABNORMAL HIGH (ref 70–99)
Glucose-Capillary: 254 mg/dL — ABNORMAL HIGH (ref 70–99)
Glucose-Capillary: 80 mg/dL (ref 70–99)
Glucose-Capillary: 99 mg/dL (ref 70–99)

## 2018-07-14 LAB — INTERLEUKIN-6, PLASMA: Interleukin-6, Plasma: 70.9 pg/mL — ABNORMAL HIGH (ref 0.0–12.2)

## 2018-07-14 LAB — URINE CULTURE: Culture: NO GROWTH

## 2018-07-14 LAB — LACTATE DEHYDROGENASE: LDH: 149 U/L (ref 98–192)

## 2018-07-14 LAB — BRAIN NATRIURETIC PEPTIDE: B Natriuretic Peptide: 50.1 pg/mL (ref 0.0–100.0)

## 2018-07-14 LAB — C-REACTIVE PROTEIN: CRP: 11.5 mg/dL — ABNORMAL HIGH (ref <1.0)

## 2018-07-14 LAB — FERRITIN: Ferritin: 59 ng/mL (ref 11–307)

## 2018-07-14 LAB — HEPATITIS B SURFACE ANTIGEN: Hepatitis B Surface Ag: NEGATIVE

## 2018-07-14 MED ORDER — INSULIN ASPART 100 UNIT/ML ~~LOC~~ SOLN
0.0000 [IU] | Freq: Every day | SUBCUTANEOUS | Status: DC
  Administered 2018-07-16: 2 [IU] via SUBCUTANEOUS

## 2018-07-14 MED ORDER — AMLODIPINE BESYLATE 5 MG PO TABS
10.0000 mg | ORAL_TABLET | Freq: Every day | ORAL | Status: DC
  Administered 2018-07-14 – 2018-07-17 (×4): 10 mg via ORAL
  Filled 2018-07-14 (×4): qty 2

## 2018-07-14 MED ORDER — LACTATED RINGERS IV SOLN: INTRAVENOUS | Status: DC

## 2018-07-14 MED ORDER — CARBIDOPA-LEVODOPA 25-100 MG PO TABS
1.0000 | ORAL_TABLET | Freq: Three times a day (TID) | ORAL | Status: DC
  Administered 2018-07-14 – 2018-07-17 (×9): 1 via ORAL
  Filled 2018-07-14 (×14): qty 1

## 2018-07-14 MED ORDER — FUROSEMIDE 10 MG/ML IJ SOLN
20.0000 mg | Freq: Once | INTRAMUSCULAR | Status: AC
  Administered 2018-07-14: 12:00:00 20 mg via INTRAVENOUS
  Filled 2018-07-14: qty 2

## 2018-07-14 MED ORDER — ENSURE ENLIVE PO LIQD
237.0000 mL | Freq: Three times a day (TID) | ORAL | Status: DC
  Administered 2018-07-14 – 2018-07-17 (×4): 237 mL via ORAL

## 2018-07-14 MED ORDER — METHYLPREDNISOLONE SODIUM SUCC 40 MG IJ SOLR
40.0000 mg | Freq: Two times a day (BID) | INTRAMUSCULAR | Status: DC
  Administered 2018-07-14 – 2018-07-17 (×6): 40 mg via INTRAVENOUS
  Filled 2018-07-14 (×7): qty 1

## 2018-07-14 MED ORDER — INSULIN ASPART 100 UNIT/ML ~~LOC~~ SOLN
0.0000 [IU] | Freq: Three times a day (TID) | SUBCUTANEOUS | Status: DC
  Administered 2018-07-14: 5 [IU] via SUBCUTANEOUS
  Administered 2018-07-15 (×2): 1 [IU] via SUBCUTANEOUS
  Administered 2018-07-15: 17:00:00 2 [IU] via SUBCUTANEOUS
  Administered 2018-07-16 (×2): 3 [IU] via SUBCUTANEOUS
  Administered 2018-07-17: 5 [IU] via SUBCUTANEOUS

## 2018-07-14 MED ORDER — POLYETHYLENE GLYCOL 3350 17 G PO PACK
17.0000 g | PACK | Freq: Every day | ORAL | Status: DC
  Administered 2018-07-15 – 2018-07-17 (×3): 17 g via ORAL
  Filled 2018-07-14 (×3): qty 1

## 2018-07-14 NOTE — Progress Notes (Signed)
Called and updated husband, Viviann Spare, on how patient is doing. He was very appreciative and asked about how much she ate for dinner. Information was provided based off documentation and he was pleased to hear.

## 2018-07-14 NOTE — Progress Notes (Signed)
PROGRESS NOTE                                                                                                                                                                                                             Patient Demographics:    Jody Young, is a 73 y.o. female, DOB - Feb 25, 1945, ZOX:[id number]  Outpatient Primary MD for the patient is Patient, No Pcp Per    LOS - 1  Admit date - 07/13/2018    CC Fever     Brief Narrative   73 year old elderly female who is a nursing home [place] in [place] for the last 2 years with past medical history of Parkinson's disease, 2 major CVAs with left-sided hemiparesis, depression, anxiety, MI x2, essential hypertension, CAD, DM type II, diabetic peripheral neuropathy, dyslipidemia, nephrolithiasis, chronic pain, GERD who was found to be febrile and hypoxic at SNF, was diagnosed with COVID-19 infection and transferred to [facility].   Subjective:   Patient in bed, appears comfortable, denies any headache, no fever, no chest pain or pressure, no shortness of breath , no abdominal pain. No focal weakness.    Assessment  & Plan :      1. Acute Hypoxic Resp. Failure due to Acute Covid 19 Viral Illness during the ongoing 2020 Covid 19 Pandemic -  She is currently in no respiratory distress, inflammatory markers are stable, currently on combination of REMDESIVIR and Solu-Medrol which will be continued.  Continue supplemental oxygen along with albuterol inhalations as needed, courage to use I-S and flutter valve for pulmonary toiletry.  Continue to monitor clinically.Marland Kitchen.   COVID-19 Labs  Recent Labs    07/13/18 0431 07/13/18 0620 07/14/18 0447  DDIMER  --  0.67* 0.78*  FERRITIN 22  --  59  LDH  --  120 149  CRP 6.3*  --   --     Lab Results  Component Value Date   SARSCOV2NAA POSITIVE (A) 07/13/2018        Component Value Date/Time   BNP 14.0 07/13/2018 0620       2.  History of CVA, MI with dense left-sided hemiparesis -has been started on dual antiplatelet therapy along with her statin regimen which he takes at home.  Supportive care, discharge back to SNF once stable.  3.  HTN.  Seemed Bystolic added Norvasc for  better control, also on IV hydralazine as needed.  4.  Chronic pain.  Hold narcotics she was admitted with severe encephalopathy and now better only this morning.  5.  Underlying Parkinson's.  Home medication resumed.  6.  Suprapubic fullness.  Foley placed, UA reordered kindly follow.  DC Foley 07/15/2018 prior to discharge.  7. DM type II.     SSI for now.  Lab Results  Component Value Date   HGBA1C 7.1 (H) 07/13/2018   CBG (last 3)  Recent Labs    07/14/18 0336 07/14/18 0812 07/14/18 1100  GLUCAP 99 80 127*         Condition - Extremely Guarded  Family Communication  : discussed with husband on 07/13/2018, DNR.  Continue medical treatment.  Code Status : DNR  Diet : Soft diet  Disposition Plan  : To SNF in 1 to 2 days.  Consults  :  None  Procedures  :    PUD Prophylaxis : PPI  DVT Prophylaxis  :  Lovenox    Lab Results  Component Value Date   PLT 209 07/14/2018    Inpatient Medications  Scheduled Meds: . amLODipine  10 mg Oral Daily  . aspirin  81 mg Oral Daily  . atorvastatin  40 mg Oral q1800  . carbidopa-levodopa  1 tablet Oral TID  . clonazepam  0.5 mg Oral BID  . clopidogrel  75 mg Oral Daily  . docusate sodium  100 mg Oral Daily  . DULoxetine  20 mg Oral Daily  . enoxaparin (LOVENOX) injection  40 mg Subcutaneous Q24H  . insulin aspart  0-9 Units Subcutaneous Q4H  . Ipratropium-Albuterol  1 puff Inhalation Q6H  . mouth rinse  15 mL Mouth Rinse BID  . methylPREDNISolone (SOLU-MEDROL) injection  40 mg Intravenous Q12H  . nitroGLYCERIN  0.4 mg Sublingual See admin instructions  . pantoprazole  40 mg Oral Daily  . polyethylene glycol  17 g Oral Daily  . vitamin C  500 mg Oral Daily  . zinc  sulfate  220 mg Oral Daily   Continuous Infusions: . cefTRIAXone (ROCEPHIN)  IV 2 g (07/14/18 0631)  . remdesivir 100 mg in NS 250 mL 100 mg (07/14/18 0827)   PRN Meds:.acetaminophen, haloperidol lactate, hydrALAZINE, [DISCONTINUED] ondansetron **OR** ondansetron (ZOFRAN) IV  Antibiotics  :    Anti-infectives (From admission, onward)   Start     Dose/Rate Route Frequency Ordered Stop   07/14/18 0800  remdesivir 100 mg in sodium chloride 0.9 % 230 mL IVPB  Status:  Discontinued     100 mg 500 mL/hr over 30 Minutes Intravenous Every 24 hours 07/13/18 0633 07/13/18 1558   07/14/18 0800  remdesivir 100 mg in sodium chloride 0.9 % 230 mL IVPB     100 mg 500 mL/hr over 30 Minutes Intravenous Every 24 hours 07/13/18 1603 07/18/18 0759   07/13/18 0800  remdesivir 200 mg in sodium chloride 0.9 % 210 mL IVPB     200 mg 500 mL/hr over 30 Minutes Intravenous Once 07/13/18 0633 07/13/18 0812   07/13/18 0600  cefTRIAXone (ROCEPHIN) 2 g in sodium chloride 0.9 % 100 mL IVPB     2 g 200 mL/hr over 30 Minutes Intravenous Every 24 hours 07/13/18 0534         Time Spent in minutes  30   Susa Raring M.D on 07/14/2018 at 11:39 AM  To page go to [email or web address] - password [facility]  [facility] -  Office  [phone]  See all Orders from today for further details    Objective:   Vitals:   07/14/18 0058 07/14/18 0351 07/14/18 0500 07/14/18 0832  BP: (!) 147/66 (!) 159/72  (!) 166/87  Pulse: (!) 103 (!) 107  (!) 109  Resp:      Temp: 98.4 F (36.9 C) 99.3 F (37.4 C)  99 F (37.2 C)  TempSrc: Oral Oral  Oral  SpO2: 92% 93%  92%  Weight:   67.8 kg     Wt Readings from Last 3 Encounters:  07/14/18 67.8 kg     Intake/Output Summary (Last 24 hours) at 07/14/2018 1139 Last data filed at 07/14/2018 1119 Gross per 24 hour  Intake 4170 ml  Output 900 ml  Net 3270 ml     Physical Exam  Awake, alert, answers basic questions and follows basic commands, baseline dense left-sided  hemiplegia Maury.AT,PERRAL Supple Neck,No JVD, No cervical lymphadenopathy appriciated.  Symmetrical Chest wall movement, Good air movement bilaterally, CTAB RRR,No Gallops, Rubs or new Murmurs, No Parasternal Heave +ve B.Sounds, Abd Soft, No tenderness, No organomegaly appriciated, No rebound - guarding or rigidity. No Cyanosis, Clubbing or edema, No new Rash or bruise     Data Review:    CBC Recent Labs  Lab 07/13/18 0620 07/14/18 0447  WBC 8.1 4.0  HGB 12.8 13.1  HCT 42.5 43.2  PLT 195 209  MCV 96.2 94.3  MCH 29.0 28.6  MCHC 30.1 30.3  RDW 13.9 13.6  LYMPHSABS 1.0 0.7  MONOABS 1.0 1.1*  EOSABS 0.0 0.0  BASOSABS 0.0 0.0    Chemistries  Recent Labs  Lab 07/13/18 0620 07/14/18 0447  NA 138 141  K 3.8 4.0  CL 99 100  CO2 29 30  GLUCOSE 117* 94  BUN 13 16  CREATININE 0.70 0.58  CALCIUM 8.7* 9.1  MG 1.8 1.7  AST 23 40  ALT 13 26  ALKPHOS 87 84  BILITOT 0.2* 0.5   ------------------------------------------------------------------------------------------------------------------ Recent Labs    07/13/18 0620  CHOL 125  HDL 38*  LDLCALC 71  TRIG 80  83  CHOLHDL 3.3    Lab Results  Component Value Date   HGBA1C 7.1 (H) 07/13/2018   ------------------------------------------------------------------------------------------------------------------ No results for input(s): TSH, T4TOTAL, T3FREE, THYROIDAB in the last 72 hours.  Invalid input(s): FREET3  Cardiac Enzymes Recent Labs  Lab 07/13/18 0432  TROPONINI <0.03   ------------------------------------------------------------------------------------------------------------------    Component Value Date/Time   BNP 14.0 07/13/2018 0620    Micro Results Recent Results (from the past 240 hour(s))  Culture, blood (Routine X 2) w Reflex to ID Panel     Status: None (Preliminary result)   Collection Time: 07/13/18  6:19 AM  Result Value Ref Range Status   Specimen Description   Final    BLOOD RIGHT  WRIST Performed at [facility], [address], [place], [place] [address]    Special Requests   Final    BOTTLES DRAWN AEROBIC ONLY Blood Culture adequate volume Performed at [facility], [address], [place], [place] [address]    Culture   Final    NO GROWTH < 24 HOURS Performed at [facility], [address], [place], [place] [address]    Report Status PENDING  Incomplete  Culture, blood (Routine X 2) w Reflex to ID Panel     Status: None (Preliminary result)   Collection Time: 07/13/18  6:20 AM  Result Value Ref Range Status   Specimen Description  Final    BLOOD RIGHT HAND Performed at [facility], [address], [place], [place] [address]    Special Requests   Final    BOTTLES DRAWN AEROBIC ONLY Blood Culture adequate volume Performed at [facility], [address] [place], [place] [address]    Culture   Final    NO GROWTH < 24 HOURS Performed at [facility], [address] [place], [place] [address]    Report Status PENDING  Incomplete  SARS Coronavirus 2 [facility] order, Performed in [facility] hospital lab)     Status: Abnormal   Collection Time: 07/13/18  6:55 AM  Result Value Ref Range Status   SARS Coronavirus 2 POSITIVE (A) NEGATIVE Final    Comment: RESULT CALLED TO, READ BACK BY AND VERIFIED WITH: K.DUFFY,RN [id number] @0904  BY V.WILKINS (NOTE) If result is NEGATIVE SARS-CoV-2 target nucleic acids are NOT DETECTED. The SARS-CoV-2 RNA is generally detectable in upper and lower  respiratory specimens during the acute phase of infection. The lowest  concentration of SARS-CoV-2 viral copies this assay can detect is 250  copies / mL. A negative result does not preclude SARS-CoV-2 infection  and should not be used as the sole basis for treatment or other  patient management decisions.  A negative result may occur with  improper specimen collection / handling,  submission of specimen other  than nasopharyngeal swab, presence of viral mutation(s) within the  areas targeted by this assay, and inadequate number of viral copies  (<250 copies / mL). A negative result must be combined with clinical  observations, patient history, and epidemiological information. If result is POSITIVE SARS-CoV-2 target nucleic acids are DETECTED.  The SARS-CoV-2 RNA is generally detectable in upper and lower  respiratory specimens during the acute phase of infection.  Positive  results are indicative of active infection with SARS-CoV-2.  Clinical  correlation with patient history and other diagnostic information is  necessary to determine patient infection status.  Positive results do  not rule out bacterial infection or co-infection with other viruses. If result is PRESUMPTIVE POSTIVE SARS-CoV-2 nucleic acids MAY BE PRESENT.   A presumptive positive result was obtained on the submitted specimen  and confirmed on repeat testing.  While 2019 novel coronavirus  (SARS-CoV-2) nucleic acids may be present in the submitted sample  additional confirmatory testing may be necessary for epidemiological  and / or clinical management purposes  to differentiate between  SARS-CoV-2 and other Sarbecovirus currently known to infect humans.  If clinically indicated additional testing with an alternate test  methodology [phone]) i s advised. The SARS-CoV-2 RNA is generally  detectable in upper and lower respiratory specimens during the acute  phase of infection. The expected result is Negative. Fact Sheet for Patients:  BoilerBrush.com.cy Fact Sheet for Healthcare Providers: https://pope.com/ This test is not yet approved or cleared by the [place] FDA and has been authorized for detection and/or diagnosis of SARS-CoV-2 by FDA under an Emergency Use Authorization (EUA).  This EUA will remain in effect (meaning this test can be  used) for the duration of the COVID-19 declaration under Section 564(b)(1) of the Act, 21 U.S.C. section 360bbb-3(b)(1), unless the authorization is terminated or revoked sooner. Performed at [facility], [address], [place], [place] [address]   Culture, Urine     Status: None   Collection Time: 07/13/18  7:50 AM  Result Value Ref Range Status   Specimen Description   Final  URINE, RANDOM Performed at [facility], [address], [place], [place] [address]    Special Requests   Final    NONE Performed at [facility], [address], [place], [place] [address]    Culture   Final    NO GROWTH Performed at [facility], [address] [place], [place] [address]    Report Status 07/14/2018 FINAL  Final  MRSA PCR Screening     Status: None   Collection Time: 07/13/18 10:18 AM  Result Value Ref Range Status   MRSA by PCR NEGATIVE NEGATIVE Final    Comment:        The GeneXpert MRSA Assay (FDA approved for NASAL specimens only), is one component of a comprehensive MRSA colonization surveillance program. It is not intended to diagnose MRSA infection nor to guide or monitor treatment for MRSA infections. Performed at [facility], [address] [place], [place] [address]     Radiology Reports Dg Chest [place] 1 View  Result Date: 07/14/2018 CLINICAL DATA:  73 year old female with history of shortness of breath. EXAM: PORTABLE CHEST 1 VIEW COMPARISON:  Chest x-ray 07/13/2018. FINDINGS: Lung volumes are low. No consolidative airspace disease. No pleural effusions. No pneumothorax. No pulmonary nodule or mass noted. Pulmonary vasculature and the cardiomediastinal silhouette are within normal limits. Aortic atherosclerosis. IMPRESSION: 1. Low lung volumes without radiographic evidence of acute cardiopulmonary disease. 2. Aortic atherosclerosis. Electronically Signed   By: Trudie Reed M.D.    On: 07/14/2018 09:43   Portable Chest 1 View  Result Date: 07/13/2018 CLINICAL DATA:  73 year old female shortness of breath.  COVID-19. EXAM: PORTABLE CHEST 1 VIEW COMPARISON:  07/12/2018 and earlier. FINDINGS: Portable AP semi upright view at 0457 hours. Lung volumes and mediastinal contours remain within normal limits. Calcified aortic atherosclerosis. Visualized tracheal air column is within normal limits. Allowing for portable technique the lungs are clear. Paucity of bowel gas in the upper abdomen. No acute osseous abnormality identified. IMPRESSION: No acute cardiopulmonary abnormality. Electronically Signed   By: Odessa Fleming M.D.   On: 07/13/2018 06:05

## 2018-07-14 NOTE — Progress Notes (Addendum)
  Speech Language Pathology Treatment: Dysphagia  Patient Details Name: Jody Young MRN: [id number] DOB: 12/16/1945 Today's Date: 07/14/2018 Time: 4128-2081 SLP Time Calculation (min) (ACUTE ONLY): 20 min  Assessment / Plan / Recommendation Clinical Impression  Pt's diet resumed this am per MD.  Per RN, she tolerated dys 3/thin liquids at lunch without difficulty.  Pt consumed mechanical solids, purees, and thin liquids during today's session with adequate mastication, brisk swallow response, and no s/s of aspiration.  Pt alert, attentive and cooperative; confused with flat affect.  She was able to feed herself with RUE but needed assistance to open and stabilize containers.  Recommend full supervision with meals to help with set-up and feeding.  Pt appears to be at baseline from a swallowing perspective - no further acute care SLP needed.  We will sign off.   HPI HPI: Pt is a 73 yo female admitted from SNF with fever and hypoxia secondary to COVID-19. PMH includes Parkinson's disease, multiple CVAs with residual left-sided hemiparesis, dysarthria, dysphagia (no prior records available), depression, MI x2, HTN, CAD, DM II, neuropathy, dyslipidemia, nephrolithiasis, chronic pain, and GERD.       SLP Plan  All goals met;Discharge SLP treatment due to (comment)       Recommendations  Diet recommendations: Dysphagia 3 (mechanical soft);Thin liquid Liquids provided via: Cup;Straw Medication Administration: Whole meds with puree Supervision: Patient able to self feed(needs tray set up; only uses right UE)                Oral Care Recommendations: Oral care BID Follow up Recommendations: Skilled Nursing facility SLP Visit Diagnosis: Dysphagia, unspecified (R13.10) Plan: All goals met;Discharge SLP treatment due to - baseline swallow       GO               Loghan Kurtzman L. Tivis Ringer, [place] CCC/SLP Acute Rehabilitation Services Office number [phone]   Juan Quam  Laurice 07/14/2018, 3:50 PM

## 2018-07-14 NOTE — Evaluation (Signed)
Physical Therapy Evaluation Patient Details Name: Jody Young MRN: [id number] DOB: 1945/03/26 Today's Date: 07/14/2018   History of Present Illness  Pt is a 73 yo female admitted 07/13/18 from SNF with fever and hypoxia secondary to COVID-19. PMH includes Parkinson's disease, multiple CVAs with residual left-sided hemiparesis, dysarthria, dysphagia (no prior records available), depression, MI x2, HTN, CAD, DM II, neuropathy, dyslipidemia, nephrolithiasis, chronic pain, and GERD.      Clinical Impression  Pt admitted with above diagnosis. Spoke with patient's husband and up until mid-March (when he was no longer allowed to visit patient at the long-term care facility), he was able to assist pt to eat, bathe, and get dressed each morning; stood her using gait belt multiple times per day; and assisted her back to bed in the evenings. Session today was limited to sitting EOB only due to pt's fear and incontinence of bowels with need to return to supine. Patient is eager to return to her recent (mid-March) level of function. Pt currently with functional limitations due to the deficits listed below (see PT Problem List).  Pt will benefit from skilled PT to increase their independence and safety with mobility to allow discharge to the venue listed below.       Follow Up Recommendations SNF (return to long-term care; potentially will need skilled PT--will see how she responds to trial of acute PT)    Equipment Recommendations  None recommended by PT    Recommendations for Other Services       Precautions / Restrictions Precautions Precautions: Fall Precaution Comments: fearful of falling Required Braces or Orthoses: Other Brace Other Brace: per husband, bil LE braces for ROM, LUE brace      Mobility  Bed Mobility Overal bed mobility: Needs Assistance Bed Mobility: Rolling;Sidelying to Sit;Sit to Sidelying Rolling: Max assist(to her left w/ difficulty using RUE across body) Sidelying to  sit: Total assist;HOB elevated     Sit to sidelying: Mod assist General bed mobility comments: pt eager to get OOB, however became fearful once feet over EOB and although she initiated side to sit by pushing on rail iwth RUE, she then would not let go of rail; ultimately she reached from rail to EOB and achieved upright  Transfers Overall transfer level: Needs assistance               General transfer comment: unable to assess due to pt with BM while at EOB (pt unaware it had occurred); returned to supine for cleaning; recommend trying +2 assist and ?steady lift  Ambulation/Gait             General Gait Details: non-ambulatory per husband  Stairs            Wheelchair Mobility    Modified Rankin (Stroke Patients Only)       Balance Overall balance assessment: Needs assistance Sitting-balance support: Single extremity supported;Feet unsupported Sitting balance-Leahy Scale: Poor Sitting balance - Comments: leans to her left                                     Pertinent Vitals/Pain Pain Assessment: No/denies pain    Home Living Family/patient expects to be discharged to:: Other (Comment)                 Additional Comments: rest home/long-term care    Prior Function Level of Independence: Needs assistance   Gait / Transfers Assistance  Needed: per husband no movement in lt side and staff either used lift or gait belt to help her OOB; had 2 courses of PT with no real progress; he would stand pt each day from EOB multiple times   ADL's / Homemaking Assistance Needed: husband assisted each morning and evening with bathing/dressing; had brace for LUE to "keep her arm from drawing up under her breast"        Hand Dominance        Extremity/Trunk Assessment   Upper Extremity Assessment Upper Extremity Assessment: RUE deficits/detail;LUE deficits/detail RUE Deficits / Details: weakness, (shoulder flexion at least to 80; full ROM not  assessed LUE Deficits / Details: contractures (shoulder adductors, internal rotators, elbow flexors, wrist and finger flexors. Pt denied pain with PROM--very limited, but able to open hand enough to observe palm without pressure or maceration of skin    Lower Extremity Assessment Lower Extremity Assessment: RLE deficits/detail;LLE deficits/detail RLE Deficits / Details: ankle plantarflexion contracture 45 degrees; per pt "that's my good one I stand on" AAROM hip flexion 2+, knee extension 3 LLE Deficits / Details: no AROM noted, ankle DF 10 degrees short of neutral, PROM knee to at least 100, hip to at least 90    Cervical / Trunk Assessment Cervical / Trunk Assessment: Kyphotic  Communication   Communication: No difficulties  Cognition Arousal/Alertness: Awake/alert Behavior During Therapy: Anxious Overall Cognitive Status: History of cognitive impairments - at baseline                                 General Comments: spoke with husband via phone while with pt      General Comments      Exercises Other Exercises Other Exercises: PROM bil hips, knees, and ankles in preparation for attempt to transfer OOB   Assessment/Plan    PT Assessment Patient needs continued PT services  PT Problem List Decreased strength;Decreased range of motion;Decreased activity tolerance;Decreased balance;Decreased mobility;Decreased cognition;Impaired tone       PT Treatment Interventions DME instruction;Functional mobility training;Therapeutic activities;Therapeutic exercise;Balance training;Cognitive remediation;Neuromuscular re-education;Patient/family education    PT Goals (Current goals can be found in the Care Plan section)  Acute Rehab PT Goals Patient Stated Goal: get up in that chairj PT Goal Formulation: With patient Time For Goal Achievement: 07/28/18 Potential to Achieve Goals: Fair    Frequency Min 2X/week   Barriers to discharge        Co-evaluation                AM-PAC PT "6 Clicks" Mobility  Outcome Measure Help needed turning from your back to your side while in a flat bed without using bedrails?: A Lot Help needed moving from lying on your back to sitting on the side of a flat bed without using bedrails?: A Lot Help needed moving to and from a bed to a chair (including a wheelchair)?: Total Help needed standing up from a chair using your arms (e.g., wheelchair or bedside chair)?: Total Help needed to walk in hospital room?: Total Help needed climbing 3-5 steps with a railing? : Total 6 Click Score: 8    End of Session   Activity Tolerance: Patient limited by fatigue;Treatment limited secondary to medical complications (Comment)(incontinence of bowels) Patient left: in bed;with call bell/phone within reach;with nursing/sitter in room(chair position with RN present for meds/lunch) Nurse Communication: Mobility status;Need for lift equipment PT Visit Diagnosis: Unsteadiness on feet (R26.81);Muscle weakness (  generalized) (M62.81)    Time: 3568-6168 PT Time Calculation (min) (ACUTE ONLY): 54 min   Charges:   PT Evaluation $PT Eval Moderate Complexity: 1 Mod PT Treatments $Therapeutic Exercise: 8-22 mins $Therapeutic Activity: 8-22 mins          [employer], PT 07/14/2018, 2:10 PM

## 2018-07-14 NOTE — Progress Notes (Signed)
Initial Nutrition Assessment   RD working remotely.   DOCUMENTATION CODES:   Not applicable  INTERVENTION:   Ensure Enlive po TID, each supplement provides 350 kcal and 20 grams of protein  Pt receiving Hormel Shake daily with Breakfast which provides 520 kcals and 22 g of protein and Magic cup BID with lunch and dinner, each supplement provides 290 kcal and 9 grams of protein, automatically on meal trays to optimize nutritional intake.   Change diet to Dysphagia III (easy to chew) diet  NUTRITION DIAGNOSIS:   Inadequate oral intake related to acute illness, poor appetite as evidenced by per patient/family report.  GOAL:   Patient will meet greater than or equal to 90% of their needs  MONITOR:   PO intake, Supplement acceptance, Labs, Skin, Weight trends  REASON FOR ASSESSMENT:   Malnutrition Screening Tool    ASSESSMENT:   73 yo female admitted with acute respiratory failure due to COVID-19 viral illness. PMH includes Parkinson's Disease, CVA with left-sided hemiparesis, depression, anxiety, MI, HTN, CAD, DM, dyslipidemia, GERD  Pt confused, oriented to self. SLP following, recommended NPO yesterday Diet advanced to Soft this AM. No recorded po intake  Current wt 67.5 kg; no previous wt encounters No height in system  Unable to obtain diet and weight history at this time.   Labs: reviewed Meds: solumedrol, Vitamin C, zinc sulfate, ss novolog   NUTRITION - FOCUSED PHYSICAL EXAM:  Unable to assess, working remotely  Diet Order:   Diet Order            DIET DYS 3 Room service appropriate? Yes; Fluid consistency: Thin  Diet effective now              EDUCATION NEEDS:   Not appropriate for education at this time  Skin:  Skin Assessment: Reviewed RN Assessment  Last BM:  6/02  Height:   Ht Readings from Last 1 Encounters:  No data found for Ht    Weight:   Wt Readings from Last 1 Encounters:  07/14/18 67.8 kg    Ideal Body Weight:    unable to assess  BMI:  There is no height or weight on file to calculate BMI.  Estimated Nutritional Needs:   Kcal:  1650-1800 kcals   Protein:  80-90 g   Fluid:  >/= 1.6 L   Romelle Starcher MS, RD, LDN, CNSC [phone] Pager  [phone] Weekend/On-Call Pager

## 2018-07-14 NOTE — Plan of Care (Signed)

## 2018-07-14 NOTE — TOC Initial Note (Signed)
Transition of Care [facility]) - Initial/Assessment Note    Patient Details  Name: Jody Young MRN: [id number] Date of Birth: 19-Aug-1945  Transition of Care [facility]) CM/SW Contact:    Donnie Coffin, LCSW Phone Number: 07/14/2018, 3:55 PM  Clinical Narrative:                 CSW spoke with patients spouse, Viviann Spare, via phone regarding disposition plans. Patient is a current long-term resident at [facility]. Patient has been at facility for 2 1/2 years- spouse voiced no concerns with patient returning at this time. CSW spoke with representative from facility and they are able to accept patient back once medically stable.   Spouse did request hospital staff to contact him on his cell phone: [phone]  Expected Discharge Plan: Skilled Nursing Facility Barriers to Discharge: No Barriers Identified   Patient Goals and CMS Choice Patient states their goals for this hospitalization and ongoing recovery are:: Getting well enough to go back to [facility]  CMS Medicare.gov Compare Post Acute Care list provided to:: Other (Comment Required)(patient from facility )    Expected Discharge Plan and Services Expected Discharge Plan: Skilled Nursing Facility       Living arrangements for the past 2 months: Skilled Nursing Facility                                      Prior Living Arrangements/Services Living arrangements for the past 2 months: Skilled Nursing Facility Lives with:: Facility Resident Patient language and need for interpreter reviewed:: Yes Do you feel safe going back to the place where you live?: Yes      Need for Family Participation in Patient Care: Yes (Comment) Care giver support system in place?: Yes (comment)(Spouse- Viviann Spare)   Criminal Activity/Legal Involvement Pertinent to Current Situation/Hospitalization: No - Comment as needed  Activities of Daily Living   ADL Screening (condition at time of admission) Patient's cognitive ability adequate to safely  complete daily activities?: No Is the patient deaf or have difficulty hearing?: Yes Does the patient have difficulty seeing, even when wearing glasses/contacts?: No Does the patient have difficulty concentrating, remembering, or making decisions?: Yes Patient able to express need for assistance with ADLs?: No Does the patient have difficulty dressing or bathing?: Yes Independently performs ADLs?: No Communication: Needs assistance Is this a change from baseline?: Pre-admission baseline Dressing (OT): Dependent Is this a change from baseline?: Pre-admission baseline Grooming: Dependent Is this a change from baseline?: Pre-admission baseline Feeding: Dependent Is this a change from baseline?: Pre-admission baseline Bathing: Dependent Is this a change from baseline?: Pre-admission baseline Toileting: Dependent Is this a change from baseline?: Pre-admission baseline In/Out Bed: Dependent Is this a change from baseline?: Pre-admission baseline Walks in Home: Dependent Is this a change from baseline?: Pre-admission baseline Does the patient have difficulty walking or climbing stairs?: Yes Weakness of Legs: Both Weakness of Arms/Hands: Both  Permission Sought/Granted Permission sought to share information with : Facility Medical sales representative                Emotional Assessment Appearance:: Other (Comment Required(Spoke with spouse via phone ) Attitude/Demeanor/Rapport: Engaged(Spouse ) Affect (typically observed): Accepting, Adaptable, Calm Orientation: : Fluctuating Orientation (Suspected and/or reported Sundowners)   Psych Involvement: No (comment)  Admission diagnosis:  COVID-19 virus infection [U07.1] Patient Active Problem List   Diagnosis Date Noted  . Pneumonia due to COVID-19 virus 07/13/2018  .  CVA (cerebral vascular accident) (HCC) 07/13/2018  . Dementia due to Parkinson's disease without behavioral disturbance (HCC) 07/13/2018  . Major depressive disorder  07/13/2018  . Anxiety 07/13/2018  . History of myocardial infarction 07/13/2018  . Essential hypertension 07/13/2018  . Diabetes mellitus type 2, uncontrolled, with complications (HCC) 07/13/2018  . Diabetic neuropathy (HCC) 07/13/2018  . HLD (hyperlipidemia) 07/13/2018  . Nephrolithiasis 07/13/2018  . Generalized muscle weakness 07/13/2018  . Falls frequently 07/13/2018  . Functional urinary incontinence 07/13/2018  . Insomnia 07/13/2018  . Chronic pain syndrome 07/13/2018  . GERD (gastroesophageal reflux disease) 07/13/2018  . Restless leg syndrome 07/13/2018  . Acute lower UTI 07/13/2018  . Acute respiratory failure with hypoxia (HCC) 07/13/2018   PCP:  Patient, No Pcp Per Pharmacy:  No Pharmacies Listed    Social Determinants of Health (SDOH) Interventions    Readmission Risk Interventions No flowsheet data found.

## 2018-07-15 ENCOUNTER — Other Ambulatory Visit: Payer: Self-pay

## 2018-07-15 LAB — CBC WITH DIFFERENTIAL/PLATELET
Abs Immature Granulocytes: 0.02 10*3/uL (ref 0.00–0.07)
Basophils Absolute: 0 10*3/uL (ref 0.0–0.1)
Basophils Relative: 0 %
Eosinophils Absolute: 0 10*3/uL (ref 0.0–0.5)
Eosinophils Relative: 0 %
HCT: 45 % (ref 36.0–46.0)
Hemoglobin: 13.9 g/dL (ref 12.0–15.0)
Immature Granulocytes: 1 %
Lymphocytes Relative: 23 %
Lymphs Abs: 1 10*3/uL (ref 0.7–4.0)
MCH: 28.8 pg (ref 26.0–34.0)
MCHC: 30.9 g/dL (ref 30.0–36.0)
MCV: 93.4 fL (ref 80.0–100.0)
Monocytes Absolute: 0.9 10*3/uL (ref 0.1–1.0)
Monocytes Relative: 20 %
Neutro Abs: 2.4 10*3/uL (ref 1.7–7.7)
Neutrophils Relative %: 56 %
Platelets: 224 10*3/uL (ref 150–400)
RBC: 4.82 MIL/uL (ref 3.87–5.11)
RDW: 13.3 % (ref 11.5–15.5)
WBC: 4.2 10*3/uL (ref 4.0–10.5)
nRBC: 0 % (ref 0.0–0.2)

## 2018-07-15 LAB — GLUCOSE, CAPILLARY
Glucose-Capillary: 125 mg/dL — ABNORMAL HIGH (ref 70–99)
Glucose-Capillary: 147 mg/dL — ABNORMAL HIGH (ref 70–99)
Glucose-Capillary: 188 mg/dL — ABNORMAL HIGH (ref 70–99)
Glucose-Capillary: 176 mg/dL — ABNORMAL HIGH (ref 70–99)

## 2018-07-15 LAB — FERRITIN: Ferritin: 97 ng/mL (ref 11–307)

## 2018-07-15 LAB — COMPREHENSIVE METABOLIC PANEL
ALT: 15 U/L (ref 0–44)
AST: 45 U/L — ABNORMAL HIGH (ref 15–41)
Albumin: 3.4 g/dL — ABNORMAL LOW (ref 3.5–5.0)
Alkaline Phosphatase: 76 U/L (ref 38–126)
Anion gap: 12 (ref 5–15)
BUN: 22 mg/dL (ref 8–23)
CO2: 32 mmol/L (ref 22–32)
Calcium: 9 mg/dL (ref 8.9–10.3)
Chloride: 99 mmol/L (ref 98–111)
Creatinine, Ser: 0.61 mg/dL (ref 0.44–1.00)
GFR calc Af Amer: >60 mL/min (ref >60)
GFR calc non Af Amer: >60 mL/min (ref >60)
Glucose, Bld: 109 mg/dL — ABNORMAL HIGH (ref 70–99)
Potassium: 3.8 mmol/L (ref 3.5–5.1)
Sodium: 143 mmol/L (ref 135–145)
Total Bilirubin: 0.3 mg/dL (ref 0.3–1.2)
Total Protein: 7.7 g/dL (ref 6.5–8.1)

## 2018-07-15 LAB — D-DIMER, QUANTITATIVE: D-Dimer, Quant: 0.69 ug/mL-FEU — ABNORMAL HIGH (ref 0.00–0.50)

## 2018-07-15 LAB — C-REACTIVE PROTEIN: CRP: 5.3 mg/dL — ABNORMAL HIGH (ref <1.0)

## 2018-07-15 LAB — LACTATE DEHYDROGENASE: LDH: 187 U/L (ref 98–192)

## 2018-07-15 LAB — MAGNESIUM: Magnesium: 2 mg/dL (ref 1.7–2.4)

## 2018-07-15 MED ORDER — BETHANECHOL CHLORIDE 10 MG PO TABS
10.0000 mg | ORAL_TABLET | Freq: Three times a day (TID) | ORAL | Status: DC
  Administered 2018-07-15 – 2018-07-17 (×6): 10 mg via ORAL
  Filled 2018-07-15 (×10): qty 1

## 2018-07-15 NOTE — Progress Notes (Signed)
Husband Brett Canales updated over the phone.

## 2018-07-15 NOTE — Progress Notes (Deleted)
Ambulated patient to the bathroom several times overnight, no SOB noted, denies abdominal pain but still having intermittent nausea relieved by reglan/zofran, vss, afebrile, remains on room air.

## 2018-07-15 NOTE — Progress Notes (Signed)
Patient slept well overnight, o2 at 3L via [place], pulse ox finger probe was replaced twice but patient pulls off, involuntary leg movements noted in right leg while patient slept, did not attempt to ambulate patient tonight as she is noted to be non-ambulatory at her nursing facility due to left side being flaccid from previous cva.

## 2018-07-15 NOTE — Progress Notes (Signed)
PT Cancellation Note  Patient Details Name: Jody Young MRN: [id number] DOB: 1945-03-08   Cancelled Treatment:    Reason Eval/Treat Not Completed: Other (comment).  Pt had her scheduled klonopin this AM and is too sleepy to participate.  RN reports that she does better and is more alert in the PM.  PT will check back in the PM to attempt mobility.    Thanks,  Jody Young. Jody Young, PT, DPT  Acute Rehabilitation [phone] pager [phone]) [phone] office     Jody Young 07/15/2018, 1:10 PM

## 2018-07-15 NOTE — Progress Notes (Signed)
PROGRESS NOTE                                                                                                                                                                                                             Patient Demographics:    Jody Young, is a 73 y.o. female, DOB - 1945/07/10, WJX:[id number]  Outpatient Primary MD for the patient is Patient, No Pcp Per    LOS - 2  Admit date - 07/13/2018    CC Fever     Brief Narrative   73 year old elderly female who is a nursing home resident in [place] for the last 2 years with past medical history of Parkinson's disease, 2 major CVAs with left-sided hemiparesis, depression, anxiety, MI x2, essential hypertension, CAD, DM type II, diabetic peripheral neuropathy, dyslipidemia, nephrolithiasis, chronic pain, GERD who was found to be febrile and hypoxic at SNF, was diagnosed with COVID-19 infection and transferred to [facility].   Subjective:   Patient in bed, she is somnolent, briefly opens eyes to voice, but does not answer questions.  Falls back asleep.  Staff reports that she received Klonopin recently.  She is usually more awake in the evenings..    Assessment  & Plan :      1. Acute Hypoxic Resp. Failure due to Acute Covid 19 Viral Illness during the ongoing 2020 Covid 19 Pandemic -  She is currently in no respiratory distress, inflammatory markers are stable, currently on combination of REMDESIVIR and Solu-Medrol which will be continued.  Continue supplemental oxygen along with albuterol inhalations as needed, encourage to use I-S and flutter valve for pulmonary toiletry.  After today, she will have 2 more doses of remdesivir. Continue to monitor clinically.Marland Kitchen   COVID-19 Labs  Recent Labs    07/13/18 0431 07/13/18 0620 07/14/18 0447 07/15/18 0603  DDIMER  --  0.67* 0.78* 0.69*  FERRITIN 22  --  59 97  LDH  --  120 149 187  CRP 6.3*  --  11.5* 5.3*    Lab  Results  Component Value Date   SARSCOV2NAA POSITIVE (A) 07/13/2018        Component Value Date/Time   BNP 50.1 07/14/2018 1150      2.  History of CVA, MI with dense left-sided hemiparesis -has been started on dual antiplatelet therapy along with her statin  regimen which he takes at home.  Supportive care, discharge back to SNF once stable.  3.  HTN.  Seemed Bystolic added Norvasc for better control, also on IV hydralazine as needed.  4.  Chronic pain.  Hold narcotics she was admitted with severe encephalopathy and now better only this morning.  5.  Underlying Parkinson's.  Continue on Sinemet  6.  Suprapubic fullness.  Foley placed for urinary retention.  Will discontinue prior to discharge.  Will give a trial of bethanechol  7. DM type II.     SSI for now.  Lab Results  Component Value Date   HGBA1C 7.1 (H) 07/13/2018   CBG (last 3)  Recent Labs    07/14/18 2044 07/15/18 0802 07/15/18 1040  GLUCAP 185* 125* 147*         Condition - Extremely Guarded  Family Communication  : discussed with husband on 07/13/2018, DNR.  Continue medical treatment.  Code Status : DNR  Diet : Soft diet  Disposition Plan  : To SNF in 1 to 2 days.  Consults  :  None  Procedures  :    PUD Prophylaxis : PPI  DVT Prophylaxis  :  Lovenox    Lab Results  Component Value Date   PLT 224 07/15/2018    Inpatient Medications  Scheduled Meds: . amLODipine  10 mg Oral Daily  . aspirin  81 mg Oral Daily  . atorvastatin  40 mg Oral q1800  . carbidopa-levodopa  1 tablet Oral TID  . clonazepam  0.5 mg Oral BID  . clopidogrel  75 mg Oral Daily  . docusate sodium  100 mg Oral Daily  . DULoxetine  20 mg Oral Daily  . enoxaparin (LOVENOX) injection  40 mg Subcutaneous Q24H  . feeding supplement (ENSURE ENLIVE)  237 mL Oral TID BM  . insulin aspart  0-5 Units Subcutaneous QHS  . insulin aspart  0-9 Units Subcutaneous TID WC  . Ipratropium-Albuterol  1 puff Inhalation Q6H  . mouth  rinse  15 mL Mouth Rinse BID  . methylPREDNISolone (SOLU-MEDROL) injection  40 mg Intravenous Q12H  . nitroGLYCERIN  0.4 mg Sublingual See admin instructions  . pantoprazole  40 mg Oral Daily  . polyethylene glycol  17 g Oral Daily  . vitamin C  500 mg Oral Daily  . zinc sulfate  220 mg Oral Daily   Continuous Infusions: . remdesivir 100 mg in NS 250 mL 100 mg (07/15/18 0813)   PRN Meds:.acetaminophen, haloperidol lactate, hydrALAZINE, [DISCONTINUED] ondansetron **OR** ondansetron (ZOFRAN) IV  Antibiotics  :    Anti-infectives (From admission, onward)   Start     Dose/Rate Route Frequency Ordered Stop   07/14/18 0800  remdesivir 100 mg in sodium chloride 0.9 % 230 mL IVPB  Status:  Discontinued     100 mg 500 mL/hr over 30 Minutes Intravenous Every 24 hours 07/13/18 0633 07/13/18 1558   07/14/18 0800  remdesivir 100 mg in sodium chloride 0.9 % 230 mL IVPB     100 mg 500 mL/hr over 30 Minutes Intravenous Every 24 hours 07/13/18 1603 07/18/18 0759   07/13/18 0800  remdesivir 200 mg in sodium chloride 0.9 % 210 mL IVPB     200 mg 500 mL/hr over 30 Minutes Intravenous Once 07/13/18 0633 07/13/18 0812   07/13/18 0600  cefTRIAXone (ROCEPHIN) 2 g in sodium chloride 0.9 % 100 mL IVPB  Status:  Discontinued     2 g 200 mL/hr over 30 Minutes Intravenous Every  24 hours 07/13/18 0534 07/14/18 1141       Time Spent in minutes  30   Erick Blinks M.D on 07/15/2018 at 3:49 PM  To page go to [email or web address] - password [facility]  [facility] -  Office  [phone]    See all Orders from today for further details    Objective:   Vitals:   07/15/18 0200 07/15/18 0426 07/15/18 0600 07/15/18 1100  BP:  (!) 143/74 131/67   Pulse: (!) 112   98  Resp:    20  Temp:  99.3 F (37.4 C)    TempSrc:  Axillary    SpO2: 93%     Weight:  64 kg      Wt Readings from Last 3 Encounters:  07/15/18 64 kg     Intake/Output Summary (Last 24 hours) at 07/15/2018 1549 Last data filed at 07/15/2018  1424 Gross per 24 hour  Intake 870 ml  Output 2725 ml  Net -1855 ml     Physical Exam General exam: Somnolent, briefly opens eyes to voice Respiratory system: Clear to auscultation.  Short shallow breaths. Cardiovascular system:RRR. No murmurs, rubs, gallops. Gastrointestinal system: Abdomen is nondistended, soft and nontender. No organomegaly or masses felt. Normal bowel sounds heard. Central nervous system: Dense left hemiplegia Extremities: No C/C/E, +pedal pulses Skin: No rashes, lesions or ulcers Psychiatry: Unable to assess      Data Review:    CBC Recent Labs  Lab 07/13/18 0620 07/14/18 0447 07/15/18 0603  WBC 8.1 4.0 4.2  HGB 12.8 13.1 13.9  HCT 42.5 43.2 45.0  PLT 195 209 224  MCV 96.2 94.3 93.4  MCH 29.0 28.6 28.8  MCHC 30.1 30.3 30.9  RDW 13.9 13.6 13.3  LYMPHSABS 1.0 0.7 1.0  MONOABS 1.0 1.1* 0.9  EOSABS 0.0 0.0 0.0  BASOSABS 0.0 0.0 0.0    Chemistries  Recent Labs  Lab 07/13/18 0620 07/14/18 0447 07/15/18 0603  NA 138 141 143  K 3.8 4.0 3.8  CL 99 100 99  CO2 29 30 32  GLUCOSE 117* 94 109*  BUN CREATININE 0.70 0.58 0.61  CALCIUM 8.7* 9.1 9.0  MG 1.8 1.7 2.0  AST 23 40 45*  ALT ALKPHOS 87 84 76  BILITOT 0.2* 0.5 0.3   ------------------------------------------------------------------------------------------------------------------ Recent Labs    07/13/18 0620  CHOL 125  HDL 38*  LDLCALC 71  TRIG 80  83  CHOLHDL 3.3    Lab Results  Component Value Date   HGBA1C 7.1 (H) 07/13/2018   ------------------------------------------------------------------------------------------------------------------ No results for input(s): TSH, T4TOTAL, T3FREE, THYROIDAB in the last 72 hours.  Invalid input(s): FREET3  Cardiac Enzymes Recent Labs  Lab 07/13/18 0432  TROPONINI <0.03   ------------------------------------------------------------------------------------------------------------------    Component Value  Date/Time   BNP 50.1 07/14/2018 1150    Micro Results Recent Results (from the past 240 hour(s))  Culture, blood (Routine X 2) w Reflex to ID Panel     Status: None (Preliminary result)   Collection Time: 07/13/18  6:19 AM  Result Value Ref Range Status   Specimen Description   Final    BLOOD RIGHT WRIST Performed at [facility], [address] [place], [place] [address]    Special Requests   Final    BOTTLES DRAWN AEROBIC ONLY Blood Culture adequate volume Performed at [facility], [address] [place], [place] [address]    Culture   Final  NO GROWTH 2 DAYS Performed at [facility], [address] [place], [place] [address]    Report Status PENDING  Incomplete  Culture, blood (Routine X 2) w Reflex to ID Panel     Status: None (Preliminary result)   Collection Time: 07/13/18  6:20 AM  Result Value Ref Range Status   Specimen Description   Final    BLOOD RIGHT HAND Performed at [facility], [address] [place], [place] [address]    Special Requests   Final    BOTTLES DRAWN AEROBIC ONLY Blood Culture adequate volume Performed at [facility], [address] [place], [place] [address]    Culture   Final    NO GROWTH 2 DAYS Performed at [facility], [address] [place], [place] [address]    Report Status PENDING  Incomplete  SARS Coronavirus 2 [facility](Hospital order, Performed in [facility] hospital lab)     Status: Abnormal   Collection Time: 07/13/18  6:55 AM  Result Value Ref Range Status   SARS Coronavirus 2 POSITIVE (A) NEGATIVE Final    Comment: RESULT CALLED TO, READ BACK BY AND VERIFIED WITH: K.DUFFY,RN [id number] @0904  BY V.WILKINS (NOTE) If result is NEGATIVE SARS-CoV-2 target nucleic acids are NOT DETECTED. The SARS-CoV-2 RNA is generally detectable in upper and lower  respiratory specimens during the acute phase of infection. The lowest  concentration of  SARS-CoV-2 viral copies this assay can detect is 250  copies / mL. A negative result does not preclude SARS-CoV-2 infection  and should not be used as the sole basis for treatment or other  patient management decisions.  A negative result may occur with  improper specimen collection / handling, submission of specimen other  than nasopharyngeal swab, presence of viral mutation(s) within the  areas targeted by this assay, and inadequate number of viral copies  (<250 copies / mL). A negative result must be combined with clinical  observations, patient history, and epidemiological information. If result is POSITIVE SARS-CoV-2 target nucleic acids are DETECTED.  The SARS-CoV-2 RNA is generally detectable in upper and lower  respiratory specimens during the acute phase of infection.  Positive  results are indicative of active infection with SARS-CoV-2.  Clinical  correlation with patient history and other diagnostic information is  necessary to determine patient infection status.  Positive results do  not rule out bacterial infection or co-infection with other viruses. If result is PRESUMPTIVE POSTIVE SARS-CoV-2 nucleic acids MAY BE PRESENT.   A presumptive positive result was obtained on the submitted specimen  and confirmed on repeat testing.  While 2019 novel coronavirus  (SARS-CoV-2) nucleic acids may be present in the submitted sample  additional confirmatory testing may be necessary for epidemiological  and / or clinical management purposes  to differentiate between  SARS-CoV-2 and other Sarbecovirus currently known to infect humans.  If clinically indicated additional testing with an alternate test  methodology [phone]([id number]) i s advised. The SARS-CoV-2 RNA is generally  detectable in upper and lower respiratory specimens during the acute  phase of infection. The expected result is Negative. Fact Sheet for Patients:  BoilerBrush.com.cyhttps://www.fda.gov/media/136312/download Fact Sheet for Healthcare  Providers: https://pope.com/https://www.fda.gov/media/136313/download This test is not yet approved or cleared by the [place] FDA and has been authorized for detection and/or diagnosis of SARS-CoV-2 by FDA under an Emergency Use Authorization (EUA).  This EUA will remain in effect (meaning this test can be used) for the duration of the COVID-19 declaration  under Section 564(b)(1) of the Act, 21 U.S.C. section 360bbb-3(b)(1), unless the authorization is terminated or revoked sooner. Performed at [facility], [address] [place], [place] [address]   Culture, Urine     Status: None   Collection Time: 07/13/18  7:50 AM  Result Value Ref Range Status   Specimen Description   Final    URINE, RANDOM Performed at [facility], [address] [place], [place] [address]    Special Requests   Final    NONE Performed at [facility], [address], [place], [place] [address]    Culture   Final    NO GROWTH Performed at [facility], [address] [place], [place] [address]    Report Status 07/14/2018 FINAL  Final  MRSA PCR Screening     Status: None   Collection Time: 07/13/18 10:18 AM  Result Value Ref Range Status   MRSA by PCR NEGATIVE NEGATIVE Final    Comment:        The GeneXpert MRSA Assay (FDA approved for NASAL specimens only), is one component of a comprehensive MRSA colonization surveillance program. It is not intended to diagnose MRSA infection nor to guide or monitor treatment for MRSA infections. Performed at [facility], [address], [place], [place] [address]     Radiology Reports Dg Chest [place] 1 View  Result Date: 07/14/2018 CLINICAL DATA:  73 year old female with history of shortness of breath. EXAM: PORTABLE CHEST 1 VIEW COMPARISON:  Chest x-ray 07/13/2018. FINDINGS: Lung volumes are low. No consolidative airspace disease. No pleural effusions. No pneumothorax. No pulmonary  nodule or mass noted. Pulmonary vasculature and the cardiomediastinal silhouette are within normal limits. Aortic atherosclerosis. IMPRESSION: 1. Low lung volumes without radiographic evidence of acute cardiopulmonary disease. 2. Aortic atherosclerosis. Electronically Signed   By: Trudie Reed M.D.   On: 07/14/2018 09:43   Portable Chest 1 View  Result Date: 07/13/2018 CLINICAL DATA:  73 year old female shortness of breath.  COVID-19. EXAM: PORTABLE CHEST 1 VIEW COMPARISON:  07/12/2018 and earlier. FINDINGS: Portable AP semi upright view at 0457 hours. Lung volumes and mediastinal contours remain within normal limits. Calcified aortic atherosclerosis. Visualized tracheal air column is within normal limits. Allowing for portable technique the lungs are clear. Paucity of bowel gas in the upper abdomen. No acute osseous abnormality identified. IMPRESSION: No acute cardiopulmonary abnormality. Electronically Signed   By: Odessa Fleming M.D.   On: 07/13/2018 06:05

## 2018-07-15 NOTE — Progress Notes (Signed)
Physical Therapy Treatment Patient Details Name: Jody Young MRN: [id number] DOB: February 16, 1945 Today's Date: 07/15/2018    History of Present Illness Pt is a 73 yo female admitted 07/13/18 from SNF with fever and hypoxia secondary to COVID-19. PMH includes Parkinson's disease, multiple CVAs with residual left-sided hemiparesis, dysarthria, dysphagia (no prior records available), depression, MI x2, HTN, CAD, DM II, neuropathy, dyslipidemia, nephrolithiasis, chronic pain, and GERD.      PT Comments    Pt is more lethargic today compared to last session, but responsive and willing to participate with PT.  She tolerated EOB for >15 mins with VSS on 3 L O2 [place].  She reported low back pain and did not request to go to the chair today.  She is very stiff, so ROM to all 4 extremities preformed within her pain tolerance.  PT will continue to follow acutely for safe mobility progression  Follow Up Recommendations  SNF;Other (comment)(return to long term care, unlikely to need skilled PT)     Equipment Recommendations  None recommended by PT    Recommendations for Other Services   NA     Precautions / Restrictions Precautions Precautions: Fall Precaution Comments: fearful of falling Required Braces or Orthoses: Other Brace Other Brace: per husband, bil LE braces for ROM, LUE brace    Mobility  Bed Mobility Overal bed mobility: Needs Assistance Bed Mobility: Rolling;Supine to Sit;Sit to Supine Rolling: Total assist;+2 for physical assistance   Supine to sit: Total assist;HOB elevated Sit to supine: Total assist;HOB elevated   General bed mobility comments: Pt did not help with her right arm this session.  Total assist to get to EOB and reposition back to supine, total assist to roll for positioning on her opposite side.   Transfers                 General transfer comment: Unable to today, pt reporting too much back pain in sitting EOB.          Balance Overall balance  assessment: Needs assistance Sitting-balance support: Feet supported;No upper extremity supported Sitting balance-Leahy Scale: Zero Sitting balance - Comments: total assist EOB with left lateral lean.  Pt tolerated EOB for >15 mins before requesting to return to supine due to back pain.  Postural control: Left lateral lean                                  Cognition Arousal/Alertness: Lethargic(more alert than when we checked on her this AM) Behavior During Therapy: Flat affect Overall Cognitive Status: History of cognitive impairments - at baseline                                 General Comments: Pt was aware she had corona virus, reported she had multiple strokes in the past and knew her RN David's name.       Exercises General Exercises - Upper Extremity Shoulder Flexion: PROM;Both;10 reps Elbow Flexion: PROM;Right;10 reps Elbow Extension: PROM;Left;10 reps Composite Extension: PROM;Left(positioned towel roll to protect palm) General Exercises - Lower Extremity Ankle Circles/Pumps: PROM;Both;10 reps Heel Slides: PROM;Both;10 reps Hip ABduction/ADduction: PROM;Both;10 reps        Pertinent Vitals/Pain Pain Assessment: Faces Faces Pain Scale: Hurts even more Pain Location: back Pain Descriptors / Indicators: Grimacing;Guarding Pain Intervention(s): Limited activity within patient's tolerance;Monitored during session;Repositioned  PT Goals (current goals can now be found in the care plan section) Acute Rehab PT Goals Patient Stated Goal: Pt wanted to lay back down today, reporting back pain.  Did not mention getting up to the chair.  Progress towards PT goals: Not progressing toward goals - comment(more lethargic and painful today)    Frequency    Min 2X/week      PT Plan Current plan remains appropriate       AM-PAC PT "6 Clicks" Mobility   Outcome Measure  Help needed turning from your back to your side while in a flat  bed without using bedrails?: Total Help needed moving from lying on your back to sitting on the side of a flat bed without using bedrails?: Total Help needed moving to and from a bed to a chair (including a wheelchair)?: Total Help needed standing up from a chair using your arms (e.g., wheelchair or bedside chair)?: Total Help needed to walk in hospital room?: Total Help needed climbing 3-5 steps with a railing? : Total 6 Click Score: 6    End of Session Equipment Utilized During Treatment: Oxygen(3 L O2 [place]) Activity Tolerance: Patient limited by pain Patient left: in bed;with call bell/phone within reach;with nursing/sitter in room   PT Visit Diagnosis: Unsteadiness on feet (R26.81);Muscle weakness (generalized) (M62.81)     Time: 8250-0370 PT Time Calculation (min) (ACUTE ONLY): 27 min  Charges:  $Therapeutic Exercise: 8-22 mins $Therapeutic Activity: 8-22 mins                    Kanae Ignatowski B. Thelia Tanksley, PT, DPT  Acute Rehabilitation [phone] pager #[phone] office   07/15/2018, 5:44 PM

## 2018-07-15 NOTE — [place] FL2 (Signed)
[place] MEDICAID FL2 LEVEL OF CARE SCREENING TOOL     IDENTIFICATION  Patient Name: Jody Young Birthdate: Jun 18, 1945 Sex: female Admission Date (Current Location): 07/13/2018  [facility] and [place] Number:  [employer] and Address:  [facility] [place], [address], [place], [place] [address]([id number])      Provider Number: [id number]  Attending Physician Name and Address:  Erick Blinks, MD  Relative Name and Phone Number:  Brett Canales (spouse) [phone]    Current Level of Care: Hospital Recommended Level of Care: Skilled Nursing Facility Prior Approval Number:    Date Approved/Denied: 01/20/16 PASRR Number: [id number]  Discharge Plan: SNF    Current Diagnoses: Patient Active Problem List   Diagnosis Date Noted  . Pneumonia due to COVID-19 virus 07/13/2018  . CVA (cerebral vascular accident) (HCC) 07/13/2018  . Dementia due to Parkinson's disease without behavioral disturbance (HCC) 07/13/2018  . Major depressive disorder 07/13/2018  . Anxiety 07/13/2018  . History of myocardial infarction 07/13/2018  . Essential hypertension 07/13/2018  . Diabetes mellitus type 2, uncontrolled, with complications (HCC) 07/13/2018  . Diabetic neuropathy (HCC) 07/13/2018  . HLD (hyperlipidemia) 07/13/2018  . Nephrolithiasis 07/13/2018  . Generalized muscle weakness 07/13/2018  . Falls frequently 07/13/2018  . Functional urinary incontinence 07/13/2018  . Insomnia 07/13/2018  . Chronic pain syndrome 07/13/2018  . GERD (gastroesophageal reflux disease) 07/13/2018  . Restless leg syndrome 07/13/2018  . Acute lower UTI 07/13/2018  . Acute respiratory failure with hypoxia (HCC) 07/13/2018    Orientation RESPIRATION BLADDER Height & Weight     Self  O2(nasal cannula 3L/min) Incontinent, External catheter Weight: 141 lb 3.2 oz (64 kg) Height:     BEHAVIORAL SYMPTOMS/MOOD NEUROLOGICAL BOWEL NUTRITION STATUS      Incontinent Diet(see discharge  summary)  AMBULATORY STATUS COMMUNICATION OF NEEDS Skin   Extensive Assist Verbally Normal                       Personal Care Assistance Level of Assistance  Bathing, Feeding, Total care Bathing Assistance: Maximum assistance Feeding assistance: Independent(able to feed self with set up)   Total Care Assistance: Maximum assistance   Functional Limitations Info  Sight, Hearing, Speech Sight Info: Adequate Hearing Info: Impaired(hearing aids) Speech Info: Adequate    SPECIAL CARE FACTORS FREQUENCY  PT (By licensed PT), OT (By licensed OT)     PT Frequency: min 5x weekly OT Frequency: min 5x weekly            Contractures Contractures Info: Not present    Additional Factors Info  Code Status, Isolation Precautions, Allergies Code Status Info: DNR Allergies Info: Astemizole, Niacin     Isolation Precautions Info: air and contact precautions, emerging pathogen     Current Medications (07/15/2018):  This is the current hospital active medication list Current Facility-Administered Medications  Medication Dose Route Frequency Provider Last Rate Last Dose  . acetaminophen (TYLENOL) tablet 650 mg  650 mg Oral Q6H PRN Drema Dallas, MD      . amLODipine (NORVASC) tablet 10 mg  10 mg Oral Daily Leroy Sea, MD   10 mg at 07/15/18 0918  . aspirin chewable tablet 81 mg  81 mg Oral Daily Leroy Sea, MD   81 mg at 07/15/18 0912  . atorvastatin (LIPITOR) tablet 40 mg  40 mg Oral q1800 Leroy Sea, MD   40 mg at 07/14/18 1751  . carbidopa-levodopa (SINEMET IR) 25-100 MG per  tablet immediate release 1 tablet  1 tablet Oral TID Leroy SeaSingh, Prashant K, MD   1 tablet at 07/15/18 0912  . clonazePAM (KLONOPIN) disintegrating tablet 0.5 mg  0.5 mg Oral BID Leroy SeaSingh, Prashant K, MD   0.5 mg at 07/15/18 0913  . clopidogrel (PLAVIX) tablet 75 mg  75 mg Oral Daily Leroy SeaSingh, Prashant K, MD   75 mg at 07/15/18 0912  . docusate sodium (COLACE) capsule 100 mg  100 mg Oral Daily Leroy SeaSingh,  Prashant K, MD   100 mg at 07/15/18 0911  . DULoxetine (CYMBALTA) DR capsule 20 mg  20 mg Oral Daily Leroy SeaSingh, Prashant K, MD   20 mg at 07/15/18 0912  . enoxaparin (LOVENOX) injection 40 mg  40 mg Subcutaneous Q24H Leroy SeaSingh, Prashant K, MD   40 mg at 07/15/18 0817  . feeding supplement (ENSURE ENLIVE) (ENSURE ENLIVE) liquid 237 mL  237 mL Oral TID BM Leroy SeaSingh, Prashant K, MD   237 mL at 07/14/18 1546  . haloperidol lactate (HALDOL) injection 1 mg  1 mg Intravenous Q6H PRN Leroy SeaSingh, Prashant K, MD      . hydrALAZINE (APRESOLINE) injection 10 mg  10 mg Intravenous Q6H PRN Leroy SeaSingh, Prashant K, MD      . insulin aspart (novoLOG) injection 0-5 Units  0-5 Units Subcutaneous QHS Susa RaringSingh, Prashant K, MD      . insulin aspart (novoLOG) injection 0-9 Units  0-9 Units Subcutaneous TID WC Leroy SeaSingh, Prashant K, MD   1 Units at 07/15/18 1101  . Ipratropium-Albuterol (COMBIVENT) respimat 1 puff  1 puff Inhalation Q6H Drema DallasWoods, Curtis J, MD   1 puff at 07/14/18 1946  . MEDLINE mouth rinse  15 mL Mouth Rinse BID Leroy SeaSingh, Prashant K, MD   15 mL at 07/15/18 0924  . methylPREDNISolone sodium succinate (SOLU-MEDROL) 40 mg/mL injection 40 mg  40 mg Intravenous Q12H Leroy SeaSingh, Prashant K, MD   40 mg at 07/15/18 0552  . nitroGLYCERIN (NITROSTAT) SL tablet 0.4 mg  0.4 mg Sublingual See admin instructions Leroy SeaSingh, Prashant K, MD      . ondansetron [facility](ZOFRAN) injection 4 mg  4 mg Intravenous Q6H PRN Drema DallasWoods, Curtis J, MD      . pantoprazole (PROTONIX) EC tablet 40 mg  40 mg Oral Daily Leroy SeaSingh, Prashant K, MD   40 mg at 07/15/18 0912  . polyethylene glycol (MIRALAX / GLYCOLAX) packet 17 g  17 g Oral Daily Leroy SeaSingh, Prashant K, MD   17 g at 07/15/18 0912  . remdesivir 100 mg in sodium chloride 0.9 % 230 mL IVPB  100 mg Intravenous Q24H Leroy SeaSingh, Prashant K, MD 500 mL/hr at 07/15/18 0813 100 mg at 07/15/18 0813  . vitamin C (ASCORBIC ACID) tablet 500 mg  500 mg Oral Daily Drema DallasWoods, Curtis J, MD   500 mg at 07/15/18 0912  . zinc sulfate capsule 220 mg  220 mg Oral Daily  Drema DallasWoods, Curtis J, MD   220 mg at 07/15/18 [id number]     Discharge Medications: Please see discharge summary for a list of discharge medications.  Relevant Imaging Results:  Relevant Lab Results:   Additional Information SSN: [id number]  Gildardo GriffesAshley M Noralee Dutko, LCSW

## 2018-07-15 NOTE — Progress Notes (Signed)
Called Fransisca Burningham (spouse)  updated on patient's condition.

## 2018-07-16 LAB — COMPREHENSIVE METABOLIC PANEL
ALT: 6 U/L (ref 0–44)
AST: 32 U/L (ref 15–41)
Albumin: 3.4 g/dL — ABNORMAL LOW (ref 3.5–5.0)
Alkaline Phosphatase: 70 U/L (ref 38–126)
Anion gap: 12 (ref 5–15)
BUN: 24 mg/dL — ABNORMAL HIGH (ref 8–23)
CO2: 31 mmol/L (ref 22–32)
Calcium: 9 mg/dL (ref 8.9–10.3)
Chloride: 99 mmol/L (ref 98–111)
Creatinine, Ser: 0.6 mg/dL (ref 0.44–1.00)
GFR calc Af Amer: >60 mL/min (ref >60)
GFR calc non Af Amer: >60 mL/min (ref >60)
Glucose, Bld: 116 mg/dL — ABNORMAL HIGH (ref 70–99)
Potassium: 3.8 mmol/L (ref 3.5–5.1)
Sodium: 142 mmol/L (ref 135–145)
Total Bilirubin: 0.2 mg/dL — ABNORMAL LOW (ref 0.3–1.2)
Total Protein: 7.4 g/dL (ref 6.5–8.1)

## 2018-07-16 LAB — CBC WITH DIFFERENTIAL/PLATELET
Abs Immature Granulocytes: 0 10*3/uL (ref 0.00–0.07)
Basophils Absolute: 0 10*3/uL (ref 0.0–0.1)
Basophils Relative: 0 %
Eosinophils Absolute: 0 10*3/uL (ref 0.0–0.5)
Eosinophils Relative: 0 %
HCT: 45.3 % (ref 36.0–46.0)
Hemoglobin: 13.8 g/dL (ref 12.0–15.0)
Immature Granulocytes: 0 %
Lymphocytes Relative: 30 %
Lymphs Abs: 1 10*3/uL (ref 0.7–4.0)
MCH: 28.7 pg (ref 26.0–34.0)
MCHC: 30.5 g/dL (ref 30.0–36.0)
MCV: 94.2 fL (ref 80.0–100.0)
Monocytes Absolute: 0.7 10*3/uL (ref 0.1–1.0)
Monocytes Relative: 21 %
Neutro Abs: 1.6 10*3/uL — ABNORMAL LOW (ref 1.7–7.7)
Neutrophils Relative %: 49 %
Platelets: 240 10*3/uL (ref 150–400)
RBC: 4.81 MIL/uL (ref 3.87–5.11)
RDW: 13.5 % (ref 11.5–15.5)
WBC: 3.4 10*3/uL — ABNORMAL LOW (ref 4.0–10.5)
nRBC: 0 % (ref 0.0–0.2)

## 2018-07-16 LAB — GLUCOSE, CAPILLARY
Glucose-Capillary: 121 mg/dL — ABNORMAL HIGH (ref 70–99)
Glucose-Capillary: 208 mg/dL — ABNORMAL HIGH (ref 70–99)
Glucose-Capillary: 229 mg/dL — ABNORMAL HIGH (ref 70–99)
Glucose-Capillary: 217 mg/dL — ABNORMAL HIGH (ref 70–99)

## 2018-07-16 LAB — FERRITIN: Ferritin: 98 ng/mL (ref 11–307)

## 2018-07-16 LAB — C-REACTIVE PROTEIN: CRP: 2.6 mg/dL — ABNORMAL HIGH (ref <1.0)

## 2018-07-16 LAB — LACTATE DEHYDROGENASE: LDH: 150 U/L (ref 98–192)

## 2018-07-16 LAB — MAGNESIUM: Magnesium: 2 mg/dL (ref 1.7–2.4)

## 2018-07-16 LAB — D-DIMER, QUANTITATIVE: D-Dimer, Quant: 0.49 ug/mL-FEU (ref 0.00–0.50)

## 2018-07-16 MED ORDER — IPRATROPIUM-ALBUTEROL 20-100 MCG/ACT IN AERS: 1.0000 | INHALATION_SPRAY | Freq: Four times a day (QID) | RESPIRATORY_TRACT | Status: DC | PRN

## 2018-07-16 NOTE — Progress Notes (Signed)
Called and updated husband over the phone.

## 2018-07-16 NOTE — Progress Notes (Addendum)
PROGRESS NOTE                                                                                                                                                                                                             Patient Demographics:    Jody BurlingtonSusan Young, is a 73 y.o. female, DOB - 11/07/1945, ZOX:[id number]  Outpatient Primary MD for the patient is Patient, No Pcp Per    LOS - 3  Admit date - 07/13/2018    CC Fever     Brief Narrative   73 year old elderly female who [facility] the [facility] 2 [facility] with past medical history of Parkinson's disease, 2 major CVAs with left-sided hemiparesis, depression, anxiety, MI x2, essential hypertension, CAD, DM type II, diabetic peripheral neuropathy, dyslipidemia, nephrolithiasis, chronic pain, GERD who was found to be febrile and hypoxic at SNF, was diagnosed with COVID-19 infection and [facility].   Subjective:   Patient in bed, she wakes up to voice, appears mildly anxious, reports that she ate breakfast this morning.  Denies any worsening shortness of breath.    Assessment  & Plan :      1. Acute Hypoxic Resp. Failure due to Acute Covid 19 Viral Illness during the ongoing 2020 Covid 19 Pandemic -  She is currently in no respiratory distress, inflammatory markers are stable, currently on combination of REMDESIVIR and Solu-Medrol which will be continued.  Continue supplemental oxygen along with albuterol inhalations as needed, encourage to use I-S and flutter valve for pulmonary toiletry.  After today, she will have 1 more doses of remdesivir. Continue to monitor clinically.Marland Kitchen.   COVID-19 Labs  Recent Labs    07/14/18 0447 07/15/18 0603 07/16/18 0325  DDIMER 0.78* 0.69* 0.49  FERRITIN 59 97 98  LDH 149 187 150  CRP 11.5* 5.3* 2.6*    Lab Results  Component Value Date   SARSCOV2NAA POSITIVE (A) 07/13/2018        Component Value  Date/Time   BNP 50.1 07/14/2018 1150      2.  History of CVA, MI with dense left-sided hemiparesis -has been started on dual antiplatelet therapy along with her statin regimen which he takes at home.  Supportive care, discharge back to SNF once stable.  3.  HTN.  Seemed Bystolic added Norvasc for better  control, also on IV hydralazine as needed.  4.  Chronic pain.  Hold narcotics she was admitted with severe encephalopathy and now better only this morning.  5.  Underlying Parkinson's.  Continue on Sinemet  6.  Suprapubic fullness.  Foley placed for urinary retention.  Will discontinue today for voiding trial.  She is on a trial of bethanechol  7. DM type II.     SSI for now.  Lab Results  Component Value Date   HGBA1C 7.1 (H) 07/13/2018   CBG (last 3)  Recent Labs    07/15/18 2101 07/16/18 0810 07/16/18 1214  GLUCAP 176* 121* 208*         Condition - Extremely Guarded  Family Communication  : discussed with husband on 6/4  Code Status : DNR  Diet : Soft diet  Disposition Plan  : To SNF in 1 to 2 days.  Consults  :  None  Procedures  :    PUD Prophylaxis : PPI  DVT Prophylaxis  :  Lovenox    Lab Results  Component Value Date   PLT 240 07/16/2018    Inpatient Medications  Scheduled Meds: . amLODipine  10 mg Oral Daily  . aspirin  81 mg Oral Daily  . atorvastatin  40 mg Oral q1800  . bethanechol  10 mg Oral TID  . carbidopa-levodopa  1 tablet Oral TID  . clonazepam  0.5 mg Oral BID  . clopidogrel  75 mg Oral Daily  . docusate sodium  100 mg Oral Daily  . DULoxetine  20 mg Oral Daily  . enoxaparin (LOVENOX) injection  40 mg Subcutaneous Q24H  . feeding supplement (ENSURE ENLIVE)  237 mL Oral TID BM  . insulin aspart  0-5 Units Subcutaneous QHS  . insulin aspart  0-9 Units Subcutaneous TID WC  . mouth rinse  15 mL Mouth Rinse BID  . methylPREDNISolone (SOLU-MEDROL) injection  40 mg Intravenous Q12H  . nitroGLYCERIN  0.4 mg Sublingual See admin  instructions  . pantoprazole  40 mg Oral Daily  . polyethylene glycol  17 g Oral Daily  . vitamin C  500 mg Oral Daily  . zinc sulfate  220 mg Oral Daily   Continuous Infusions: . remdesivir 100 mg in NS 250 mL 100 mg (07/16/18 0914)   PRN Meds:.acetaminophen, haloperidol lactate, hydrALAZINE, Ipratropium-Albuterol, [DISCONTINUED] ondansetron **OR** ondansetron (ZOFRAN) IV  Antibiotics  :    Anti-infectives (From admission, onward)   Start     Dose/Rate Route Frequency Ordered Stop   07/14/18 0800  remdesivir 100 mg in sodium chloride 0.9 % 230 mL IVPB  Status:  Discontinued     100 mg 500 mL/hr over 30 Minutes Intravenous Every 24 hours 07/13/18 0633 07/13/18 1558   07/14/18 0800  remdesivir 100 mg in sodium chloride 0.9 % 230 mL IVPB     100 mg 500 mL/hr over 30 Minutes Intravenous Every 24 hours 07/13/18 1603 07/18/18 0759   07/13/18 0800  remdesivir 200 mg in sodium chloride 0.9 % 210 mL IVPB     200 mg 500 mL/hr over 30 Minutes Intravenous Once 07/13/18 0633 07/13/18 0812   07/13/18 0600  cefTRIAXone (ROCEPHIN) 2 g in sodium chloride 0.9 % 100 mL IVPB  Status:  Discontinued     2 g 200 mL/hr over 30 Minutes Intravenous Every 24 hours 07/13/18 0534 07/14/18 1141       Time Spent in minutes  30   Erick Blinks M.D on 07/16/2018 at 4:28 PM  To page go to [email or web address] - password [facility]  [facility] -  Office  [phone]    See all Orders from today for further details    Objective:   Vitals:   07/16/18 0800 07/16/18 0807 07/16/18 1200 07/16/18 1624  BP:    140/74  Pulse:      Resp:      Temp:  98.3 F (36.8 C) 98.7 F (37.1 C) 98.9 F (37.2 C)  TempSrc:  Oral Oral Oral  SpO2: 93%     Weight:        Wt Readings from Last 3 Encounters:  07/16/18 64 kg     Intake/Output Summary (Last 24 hours) at 07/16/2018 1628 Last data filed at 07/16/2018 0600 Gross per 24 hour  Intake 600 ml  Output 800 ml  Net -200 ml     Physical Exam General exam:  Awake, alert Respiratory system: Clear to auscultation.  Normal respiratory effort Cardiovascular system:RRR. No murmurs, rubs, gallops. Gastrointestinal system: Abdomen is nondistended, soft and nontender. No organomegaly or masses felt. Normal bowel sounds heard. Central nervous system: Dense left hemiplegia Extremities: No C/C/E, +pedal pulses Skin: No rashes, lesions or ulcers Psychiatry: Appears mildly anxious, confused at times    Data Review:    CBC Recent Labs  Lab 07/13/18 0620 07/14/18 0447 07/15/18 0603 07/16/18 0325  WBC 8.1 4.0 4.2 3.4*  HGB 12.8 13.1 13.9 13.8  HCT 42.5 43.2 45.0 45.3  PLT 195 209 224 240  MCV 96.2 94.3 93.4 94.2  MCH 29.0 28.6 28.8 28.7  MCHC 30.1 30.3 30.9 30.5  RDW 13.9 13.6 13.3 13.5  LYMPHSABS 1.0 0.7 1.0 1.0  MONOABS 1.0 1.1* 0.9 0.7  EOSABS 0.0 0.0 0.0 0.0  BASOSABS 0.0 0.0 0.0 0.0    Chemistries  Recent Labs  Lab 07/13/18 0620 07/14/18 0447 07/15/18 0603 07/16/18 0325  NA 138 141 143 142  K 3.8 4.0 3.8 3.8  CL 99 100 99 99  CO2 29 30 32 31  GLUCOSE 117* 94 109* 116*  BUN 13 16 22  24*  CREATININE 0.70 0.58 0.61 0.60  CALCIUM 8.7* 9.1 9.0 9.0  MG 1.8 1.7 2.0 2.0  AST 23 40 45* 32  ALT 13 26 15 6   ALKPHOS 87 84 76 70  BILITOT 0.2* 0.5 0.3 0.2*   ------------------------------------------------------------------------------------------------------------------ No results for input(s): CHOL, HDL, LDLCALC, TRIG, CHOLHDL, LDLDIRECT in the last 72 hours.  Lab Results  Component Value Date   HGBA1C 7.1 (H) 07/13/2018   ------------------------------------------------------------------------------------------------------------------ No results for input(s): TSH, T4TOTAL, T3FREE, THYROIDAB in the last 72 hours.  Invalid input(s): FREET3  Cardiac Enzymes Recent Labs  Lab 07/13/18 0432  TROPONINI <0.03   ------------------------------------------------------------------------------------------------------------------     Component Value Date/Time   BNP 50.1 07/14/2018 1150    Micro Results Recent Results (from the past 240 hour(s))  Culture, blood (Routine X 2) w Reflex to ID Panel     Status: None (Preliminary result)   Collection Time: 07/13/18  6:19 AM  Result Value Ref Range Status   Specimen Description   Final    BLOOD RIGHT WRIST Performed at [facility], [address] [place], [place] [address]    Special Requests   Final    BOTTLES DRAWN AEROBIC ONLY Blood Culture adequate volume Performed at [facility], [address] [place], [place] [address]    Culture   Final    NO GROWTH 3 DAYS Performed at [facility], [address], [place], [place] [address]    Report Status PENDING  Incomplete  Culture, blood (Routine X 2) w Reflex to ID Panel     Status: None (Preliminary result)   Collection Time: 07/13/18  6:20 AM  Result Value Ref Range Status   Specimen Description   Final    BLOOD RIGHT HAND Performed at [facility], [address] [place], [place] [address]    Special Requests   Final    BOTTLES DRAWN AEROBIC ONLY Blood Culture adequate volume Performed at [facility], [address] [place], [place] [address]    Culture   Final    NO GROWTH 3 DAYS Performed at [facility], [address], [place], [place] [address]    Report Status PENDING  Incomplete  SARS Coronavirus 2 [facility] order, Performed in [facility] hospital lab)     Status: Abnormal   Collection Time: 07/13/18  6:55 AM  Result Value Ref Range Status   SARS Coronavirus 2 POSITIVE (A) NEGATIVE Final    Comment: RESULT CALLED TO, READ BACK BY AND VERIFIED WITH: K.DUFFY,RN [id number]  BY V.WILKINS (NOTE) If result is NEGATIVE SARS-CoV-2 target nucleic acids are NOT DETECTED. The SARS-CoV-2 RNA is generally detectable in upper and lower  respiratory specimens during the acute phase of infection. The lowest   concentration of SARS-CoV-2 viral copies this assay can detect is 250  copies / mL. A negative result does not preclude SARS-CoV-2 infection  and should not be used as the sole basis for treatment or other  patient management decisions.  A negative result may occur with  improper specimen collection / handling, submission of specimen other  than nasopharyngeal swab, presence of viral mutation(s) within the  areas targeted by this assay, and inadequate number of viral copies  (<250 copies / mL). A negative result must be combined with clinical  observations, patient history, and epidemiological information. If result is POSITIVE SARS-CoV-2 target nucleic acids are DETECTED.  The SARS-CoV-2 RNA is generally detectable in upper and lower  respiratory specimens during the acute phase of infection.  Positive  results are indicative of active infection with SARS-CoV-2.  Clinical  correlation with patient history and other diagnostic information is  necessary to determine patient infection status.  Positive results do  not rule out bacterial infection or co-infection with other viruses. If result is PRESUMPTIVE POSTIVE SARS-CoV-2 nucleic acids MAY BE PRESENT.   A presumptive positive result was obtained on the submitted specimen  and confirmed on repeat testing.  While 2019 novel coronavirus  (SARS-CoV-2) nucleic acids may be present in the submitted sample  additional confirmatory testing may be necessary for epidemiological  and / or clinical management purposes  to differentiate between  SARS-CoV-2 and other Sarbecovirus currently known to infect humans.  If clinically indicated additional testing with an alternate test  methodology [phone]) i s advised. The SARS-CoV-2 RNA is generally  detectable in upper and lower respiratory specimens during the acute  phase of infection. The expected result is Negative. Fact Sheet for Patients:  BoilerBrush.com.cy Fact Sheet  for Healthcare Providers: https://pope.com/ This test is not yet approved or cleared by the [place] FDA and has been authorized for detection and/or diagnosis of SARS-CoV-2 by FDA under an Emergency Use Authorization (EUA).  This EUA will remain in effect (meaning this test can be used) for the duration of the COVID-19 declaration under Section 564(b)(1) of the Act, 21 U.S.C. section 360bbb-3(b)(1), unless  the authorization is terminated or revoked sooner. Performed at [facility] [address], [place], [place] [address]   Culture, Urine     Status: None   Collection Time: 07/13/18  7:50 AM  Result Value Ref Range Status   Specimen Description   Final    URINE, RANDOM Performed at [facility], [address], [place], [place] [address]    Special Requests   Final    NONE Performed at [facility] [address] [place], [place] [address]    Culture   Final    NO GROWTH Performed at [facility], [address], [place], [place] [address]    Report Status 07/14/2018 FINAL  Final  MRSA PCR Screening     Status: None   Collection Time: 07/13/18 10:18 AM  Result Value Ref Range Status   MRSA by PCR NEGATIVE NEGATIVE Final    Comment:        The GeneXpert MRSA Assay (FDA approved for NASAL specimens only), is one component of a comprehensive MRSA colonization surveillance program. It is not intended to diagnose MRSA infection nor to guide or monitor treatment for MRSA infections. Performed at [facility], [address], [place], [place] [address]     Radiology Reports Dg Chest [place] 1 View  Result Date: 07/14/2018 CLINICAL DATA:  72 year old female with history of shortness of breath. EXAM: PORTABLE CHEST 1 VIEW COMPARISON:  Chest x-ray 07/13/2018. FINDINGS: Lung volumes are low. No consolidative airspace disease. No pleural effusions. No pneumothorax. No  pulmonary nodule or mass noted. Pulmonary vasculature and the cardiomediastinal silhouette are within normal limits. Aortic atherosclerosis. IMPRESSION: 1. Low lung volumes without radiographic evidence of acute cardiopulmonary disease. 2. Aortic atherosclerosis. Electronically Signed   By: Trudie Reed M.D.   On: 07/14/2018 09:43   Portable Chest 1 View  Result Date: 07/13/2018 CLINICAL DATA:  73 year old female shortness of breath.  COVID-19. EXAM: PORTABLE CHEST 1 VIEW COMPARISON:  07/12/2018 and earlier. FINDINGS: Portable AP semi upright view at 0457 hours. Lung volumes and mediastinal contours remain within normal limits. Calcified aortic atherosclerosis. Visualized tracheal air column is within normal limits. Allowing for portable technique the lungs are clear. Paucity of bowel gas in the upper abdomen. No acute osseous abnormality identified. IMPRESSION: No acute cardiopulmonary abnormality. Electronically Signed   By: Odessa Fleming M.D.   On: 07/13/2018 06:05

## 2018-07-17 DIAGNOSIS — J9600 Acute respiratory failure, unspecified whether with hypoxia or hypercapnia: Secondary | ICD-10-CM | POA: Diagnosis not present

## 2018-07-17 DIAGNOSIS — M2550 Pain in unspecified joint: Secondary | ICD-10-CM | POA: Diagnosis not present

## 2018-07-17 DIAGNOSIS — F419 Anxiety disorder, unspecified: Secondary | ICD-10-CM | POA: Diagnosis not present

## 2018-07-17 DIAGNOSIS — R410 Disorientation, unspecified: Secondary | ICD-10-CM | POA: Diagnosis not present

## 2018-07-17 DIAGNOSIS — S72012D Unspecified intracapsular fracture of left femur, subsequent encounter for closed fracture with routine healing: Secondary | ICD-10-CM | POA: Diagnosis not present

## 2018-07-17 DIAGNOSIS — R293 Abnormal posture: Secondary | ICD-10-CM | POA: Diagnosis not present

## 2018-07-17 DIAGNOSIS — G894 Chronic pain syndrome: Secondary | ICD-10-CM | POA: Diagnosis not present

## 2018-07-17 DIAGNOSIS — E1165 Type 2 diabetes mellitus with hyperglycemia: Secondary | ICD-10-CM | POA: Diagnosis not present

## 2018-07-17 DIAGNOSIS — Z7401 Bed confinement status: Secondary | ICD-10-CM | POA: Diagnosis not present

## 2018-07-17 DIAGNOSIS — M6281 Muscle weakness (generalized): Secondary | ICD-10-CM | POA: Diagnosis not present

## 2018-07-17 DIAGNOSIS — I6300 Cerebral infarction due to thrombosis of unspecified precerebral artery: Secondary | ICD-10-CM | POA: Diagnosis not present

## 2018-07-17 DIAGNOSIS — E118 Type 2 diabetes mellitus with unspecified complications: Secondary | ICD-10-CM | POA: Diagnosis not present

## 2018-07-17 DIAGNOSIS — N3946 Mixed incontinence: Secondary | ICD-10-CM | POA: Diagnosis not present

## 2018-07-17 DIAGNOSIS — E785 Hyperlipidemia, unspecified: Secondary | ICD-10-CM | POA: Diagnosis not present

## 2018-07-17 DIAGNOSIS — R2681 Unsteadiness on feet: Secondary | ICD-10-CM | POA: Diagnosis not present

## 2018-07-17 DIAGNOSIS — I10 Essential (primary) hypertension: Secondary | ICD-10-CM | POA: Diagnosis not present

## 2018-07-17 DIAGNOSIS — U071 COVID-19: Secondary | ICD-10-CM | POA: Diagnosis not present

## 2018-07-17 DIAGNOSIS — I2541 Coronary artery aneurysm: Secondary | ICD-10-CM | POA: Diagnosis not present

## 2018-07-17 DIAGNOSIS — N390 Urinary tract infection, site not specified: Secondary | ICD-10-CM | POA: Diagnosis not present

## 2018-07-17 DIAGNOSIS — F329 Major depressive disorder, single episode, unspecified: Secondary | ICD-10-CM | POA: Diagnosis not present

## 2018-07-17 DIAGNOSIS — J189 Pneumonia, unspecified organism: Secondary | ICD-10-CM | POA: Diagnosis not present

## 2018-07-17 DIAGNOSIS — Z9181 History of falling: Secondary | ICD-10-CM | POA: Diagnosis not present

## 2018-07-17 DIAGNOSIS — G20 Parkinson's disease: Secondary | ICD-10-CM | POA: Diagnosis not present

## 2018-07-17 DIAGNOSIS — G2581 Restless legs syndrome: Secondary | ICD-10-CM | POA: Diagnosis not present

## 2018-07-17 DIAGNOSIS — J9601 Acute respiratory failure with hypoxia: Secondary | ICD-10-CM | POA: Diagnosis not present

## 2018-07-17 DIAGNOSIS — I69354 Hemiplegia and hemiparesis following cerebral infarction affecting left non-dominant side: Secondary | ICD-10-CM | POA: Diagnosis not present

## 2018-07-17 DIAGNOSIS — R1312 Dysphagia, oropharyngeal phase: Secondary | ICD-10-CM | POA: Diagnosis not present

## 2018-07-17 DIAGNOSIS — E1140 Type 2 diabetes mellitus with diabetic neuropathy, unspecified: Secondary | ICD-10-CM | POA: Diagnosis not present

## 2018-07-17 LAB — CBC WITH DIFFERENTIAL/PLATELET
Abs Immature Granulocytes: 0.01 10*3/uL (ref 0.00–0.07)
Basophils Absolute: 0 10*3/uL (ref 0.0–0.1)
Basophils Relative: 0 %
Eosinophils Absolute: 0 10*3/uL (ref 0.0–0.5)
Eosinophils Relative: 0 %
HCT: 45.6 % (ref 36.0–46.0)
Hemoglobin: 13.8 g/dL (ref 12.0–15.0)
Immature Granulocytes: 0 %
Lymphocytes Relative: 35 %
Lymphs Abs: 1.3 10*3/uL (ref 0.7–4.0)
MCH: 28.3 pg (ref 26.0–34.0)
MCHC: 30.3 g/dL (ref 30.0–36.0)
MCV: 93.4 fL (ref 80.0–100.0)
Monocytes Absolute: 0.6 10*3/uL (ref 0.1–1.0)
Monocytes Relative: 16 %
Neutro Abs: 1.8 10*3/uL (ref 1.7–7.7)
Neutrophils Relative %: 49 %
Platelets: 215 10*3/uL (ref 150–400)
RBC: 4.88 MIL/uL (ref 3.87–5.11)
RDW: 13.1 % (ref 11.5–15.5)
WBC: 3.7 10*3/uL — ABNORMAL LOW (ref 4.0–10.5)
nRBC: 0 % (ref 0.0–0.2)

## 2018-07-17 LAB — COMPREHENSIVE METABOLIC PANEL
ALT: 6 U/L (ref 0–44)
AST: 26 U/L (ref 15–41)
Albumin: 3.4 g/dL — ABNORMAL LOW (ref 3.5–5.0)
Alkaline Phosphatase: 60 U/L (ref 38–126)
Anion gap: 8 (ref 5–15)
BUN: 23 mg/dL (ref 8–23)
CO2: 32 mmol/L (ref 22–32)
Calcium: 9.1 mg/dL (ref 8.9–10.3)
Chloride: 102 mmol/L (ref 98–111)
Creatinine, Ser: 0.55 mg/dL (ref 0.44–1.00)
GFR calc Af Amer: >60 mL/min (ref >60)
GFR calc non Af Amer: >60 mL/min (ref >60)
Glucose, Bld: 125 mg/dL — ABNORMAL HIGH (ref 70–99)
Potassium: 3.5 mmol/L (ref 3.5–5.1)
Sodium: 142 mmol/L (ref 135–145)
Total Bilirubin: 0.4 mg/dL (ref 0.3–1.2)
Total Protein: 7.4 g/dL (ref 6.5–8.1)

## 2018-07-17 LAB — LACTATE DEHYDROGENASE: LDH: 144 U/L (ref 98–192)

## 2018-07-17 LAB — D-DIMER, QUANTITATIVE: D-Dimer, Quant: 0.46 ug/mL-FEU (ref 0.00–0.50)

## 2018-07-17 LAB — GLUCOSE, CAPILLARY
Glucose-Capillary: 224 mg/dL — ABNORMAL HIGH (ref 70–99)
Glucose-Capillary: 118 mg/dL — ABNORMAL HIGH (ref 70–99)
Glucose-Capillary: 280 mg/dL — ABNORMAL HIGH (ref 70–99)

## 2018-07-17 LAB — C-REACTIVE PROTEIN: CRP: 0.9 mg/dL (ref <1.0)

## 2018-07-17 LAB — FERRITIN: Ferritin: 85 ng/mL (ref 11–307)

## 2018-07-17 MED ORDER — PREDNISONE 10 MG PO TABS: ORAL_TABLET | ORAL | 0 refills | Status: DC

## 2018-07-17 MED ORDER — CLONAZEPAM 0.5 MG PO TBDP: 0.5000 mg | ORAL_TABLET | Freq: Two times a day (BID) | ORAL | 0 refills | Status: DC

## 2018-07-17 NOTE — Discharge Summary (Signed)
Physician Discharge Summary  REHMAT MURTAGH WUJ:[id number] DOB: 22-Feb-1945 DOA: 07/13/2018  PCP: Patient, No Pcp Per  Admit date: 07/13/2018 Discharge date: 07/17/2018  Admitted From: Skilled nursing facility Disposition: Skilled nursing facility  Recommendations for Outpatient Follow-up:  1. Follow up with PCP in 1-2 weeks 2. Please obtain BMP/CBC in one week  Discharge Condition: Stable CODE STATUS: DNR Diet recommendation: Heart healthy, carb modified, dysphagia 3 diet with thin liquids  Brief/Interim Summary: 73 year old female who is a resident of a skilled nursing facility, and has a history of Parkinson's disease, prior stroke with residual left-sided hemiparesis, depression, anxiety, coronary artery disease, diabetes, chronic pain, was found to be febrile and hypoxic at her nursing facility.  She was diagnosed with COVID-19 infection and transferred to [facility] for further management.  Discharge Diagnoses:  Active Problems:   Pneumonia due to COVID-19 virus   CVA (cerebral vascular accident) (HCC)   Dementia due to Parkinson's disease without behavioral disturbance (HCC)   Major depressive disorder   Anxiety   History of myocardial infarction   Essential hypertension   Diabetes mellitus type 2, uncontrolled, with complications (HCC)   Diabetic neuropathy (HCC)   HLD (hyperlipidemia)   Nephrolithiasis   Generalized muscle weakness   Falls frequently   Functional urinary incontinence   Insomnia   Chronic pain syndrome   GERD (gastroesophageal reflux disease)   Restless leg syndrome   Acute lower UTI   Acute respiratory failure with hypoxia (HCC)  1. Acute respiratory failure with hypoxia secondary to COVID-19 infection.  Patient was treated with Solu-Medrol as well as a course of MDS.Marland Kitchen  Clinically, she has improved.  She has been weaned down to 2 L of oxygen and will hopefully be able to wean off in the next few days.  Fevers have resolved.  Inflammatory markers  have trended down.  She will be placed on a prednisone taper.  She has been encouraged to use incentive spirometer. 2. History of CVA/MI with residual left-sided hemiparesis.  She is continued on dual antiplatelet therapy and statin. 3. Hypertension.  She is continued on Bystolic.  Norvasc was added to her medication regimen. 4. Parkinson's disease.  She was continued on Sinemet. 5. Urinary retention.  This was transient.  She did have Foley catheter placed, but this has since been removed and she is now voiding without difficulty. 6. Diabetes.  Resume metformin and Invokana on discharge. 7. Anxiety.  We will continue the patient on Klonopin and BuSpar.  Discharge Instructions  Discharge Instructions    Diet - low sodium heart healthy   Complete by:  As directed    Increase activity slowly   Complete by:  As directed      Allergies as of 07/17/2018      Reactions   Astemizole Other (See Comments)   Hallucinations Hallucinations   Niacin Other (See Comments)   Listed on MAR      Medication List    STOP taking these medications   benazepril 40 MG tablet Commonly known as:  LOTENSIN   carisoprodol 350 MG tablet Commonly known as:  SOMA   gabapentin 100 MG capsule Commonly known as:  NEURONTIN   gabapentin 300 MG capsule Commonly known as:  NEURONTIN     TAKE these medications   acetaminophen 325 MG tablet Commonly known as:  TYLENOL Take 650 mg by mouth every 6 (six) hours as needed for mild pain or moderate pain.   Albuterol Sulfate 2.5 MG/0.5ML Nebu Inhale 1  each into the lungs every 4 (four) hours as needed (wheezing).   aspirin 81 MG chewable tablet Chew 81 mg by mouth daily.   busPIRone 10 MG tablet Commonly known as:  BUSPAR Take 10 mg by mouth 2 (two) times a day.   Cerovite Senior Tabs Take 1 tablet by mouth daily.   clonazePAM 0.5 MG disintegrating tablet Commonly known as:  KLONOPIN Take 1 tablet (0.5 mg total) by mouth 2 (two) times daily.    docusate sodium 100 MG capsule Commonly known as:  COLACE Take 100 mg by mouth daily.   DULoxetine 20 MG capsule Commonly known as:  CYMBALTA Take 20 mg by mouth daily.   Farxiga 5 MG Tabs tablet Generic drug:  dapagliflozin propanediol Take 5 mg by mouth daily.   furosemide 20 MG tablet Commonly known as:  LASIX Take 20 mg by mouth every other day.   Livalo 2 MG Tabs Generic drug:  Pitavastatin Calcium Take 2 mg by mouth daily.   meclizine 12.5 MG tablet Commonly known as:  ANTIVERT Take 12.5 mg by mouth daily.   metFORMIN 500 MG tablet Commonly known as:  GLUCOPHAGE Take by mouth 2 (two) times a day.   nitroGLYCERIN 0.4 MG SL tablet Commonly known as:  NITROSTAT Place 0.4 mg under the tongue See admin instructions. Every 5 minutes up to 3 doses for chest pain.   Norvasc 10 MG tablet Generic drug:  amLODipine Take 10 mg by mouth daily.   pantoprazole 40 MG tablet Commonly known as:  PROTONIX Take 40 mg by mouth daily.   Plavix 75 MG tablet Generic drug:  clopidogrel Take 75 mg by mouth daily.   polyethylene glycol 17 g packet Commonly known as:  MIRALAX / GLYCOLAX Take 17 g by mouth daily as needed for mild constipation or moderate constipation.   pramipexole 0.5 MG tablet Commonly known as:  MIRAPEX Take 0.5 mg by mouth 2 (two) times a day.   predniSONE 10 MG tablet Commonly known as:  DELTASONE Take 40mg  po daily for 2 days then 30mg  daily for 2 days then 20mg  daily for 2 days then 10mg  daily for 2 days then stop   Sinemet 25-100 MG tablet Generic drug:  carbidopa-levodopa Take 1 tablet by mouth 3 (three) times daily. 9 am/ 1 pm / 9 pm   vitamin C 500 MG tablet Commonly known as:  ASCORBIC ACID Take 500 mg by mouth daily.       Allergies  Allergen Reactions  . Astemizole Other (See Comments)    Hallucinations Hallucinations   . Niacin Other (See Comments)    Listed on MAR    Consultations:     Procedures/Studies: Dg Chest Port 1  View  Result Date: 07/14/2018 CLINICAL DATA:  73 year old female with history of shortness of breath. EXAM: PORTABLE CHEST 1 VIEW COMPARISON:  Chest x-ray 07/13/2018. FINDINGS: Lung volumes are low. No consolidative airspace disease. No pleural effusions. No pneumothorax. No pulmonary nodule or mass noted. Pulmonary vasculature and the cardiomediastinal silhouette are within normal limits. Aortic atherosclerosis. IMPRESSION: 1. Low lung volumes without radiographic evidence of acute cardiopulmonary disease. 2. Aortic atherosclerosis. Electronically Signed   By: Trudie Reedaniel  Entrikin M.D.   On: 07/14/2018 09:43   Portable Chest 1 View  Result Date: 07/13/2018 CLINICAL DATA:  73 year old female shortness of breath.  COVID-19. EXAM: PORTABLE CHEST 1 VIEW COMPARISON:  07/12/2018 and earlier. FINDINGS: Portable AP semi upright view at 0457 hours. Lung volumes and mediastinal contours remain within normal  limits. Calcified aortic atherosclerosis. Visualized tracheal air column is within normal limits. Allowing for portable technique the lungs are clear. Paucity of bowel gas in the upper abdomen. No acute osseous abnormality identified. IMPRESSION: No acute cardiopulmonary abnormality. Electronically Signed   By: Odessa Fleming M.D.   On: 07/13/2018 06:05       Subjective: Mildly confused, denies any shortness of breath.  She has been passing urine since Foley catheter was removed yesterday.  Discharge Exam: Vitals:   07/16/18 1917 07/17/18 0400 07/17/18 0500 07/17/18 0807  BP: 128/76   130/82  Pulse: 95     Resp: 18     Temp: 98.4 F (36.9 C) 98 F (36.7 C)    TempSrc: Oral Oral    SpO2: 99%     Weight:   64.1 kg     General: Pt is alert, awake, not in acute distress Cardiovascular: RRR, S1/S2 +, no rubs, no gallops Respiratory: CTA bilaterally, no wheezing, no rhonchi Abdominal: Soft, NT, ND, bowel sounds + Extremities: no edema, no cyanosis    The results of significant diagnostics from this  hospitalization (including imaging, microbiology, ancillary and laboratory) are listed below for reference.     Microbiology: Recent Results (from the past 240 hour(s))  Culture, blood (Routine X 2) w Reflex to ID Panel     Status: None (Preliminary result)   Collection Time: 07/13/18  6:19 AM  Result Value Ref Range Status   Specimen Description   Final    BLOOD RIGHT WRIST Performed at [facility], [address] [place], [place] [address]    Special Requests   Final    BOTTLES DRAWN AEROBIC ONLY Blood Culture adequate volume Performed at [facility], [address] [place], [place] [address]    Culture   Final    NO GROWTH 4 DAYS Performed at [facility], [address] [place], [place] [address]    Report Status PENDING  Incomplete  Culture, blood (Routine X 2) w Reflex to ID Panel     Status: None (Preliminary result)   Collection Time: 07/13/18  6:20 AM  Result Value Ref Range Status   Specimen Description   Final    BLOOD RIGHT HAND Performed at [facility], [address] [place], [place] [address]    Special Requests   Final    BOTTLES DRAWN AEROBIC ONLY Blood Culture adequate volume Performed at [facility], [address], [place], [place] [address]    Culture   Final    NO GROWTH 4 DAYS Performed at [facility], [address] [place], [place] [address]    Report Status PENDING  Incomplete  SARS Coronavirus 2 [facility] order, Performed in [facility] hospital lab)     Status: Abnormal   Collection Time: 07/13/18  6:55 AM  Result Value Ref Range Status   SARS Coronavirus 2 POSITIVE (A) NEGATIVE Final    Comment: RESULT CALLED TO, READ BACK BY AND VERIFIED WITH: K.DUFFY,RN [id number]  BY V.WILKINS (NOTE) If result is NEGATIVE SARS-CoV-2 target nucleic acids are NOT DETECTED. The SARS-CoV-2 RNA is generally detectable in upper and lower  respiratory specimens  during the acute phase of infection. The lowest  concentration of SARS-CoV-2 viral copies this assay can detect is 250  copies / mL. A negative result does not preclude SARS-CoV-2 infection  and should not be used as the sole basis for treatment or other  patient  management decisions.  A negative result may occur with  improper specimen collection / handling, submission of specimen other  than nasopharyngeal swab, presence of viral mutation(s) within the  areas targeted by this assay, and inadequate number of viral copies  (<250 copies / mL). A negative result must be combined with clinical  observations, patient history, and epidemiological information. If result is POSITIVE SARS-CoV-2 target nucleic acids are DETECTED.  The SARS-CoV-2 RNA is generally detectable in upper and lower  respiratory specimens during the acute phase of infection.  Positive  results are indicative of active infection with SARS-CoV-2.  Clinical  correlation with patient history and other diagnostic information is  necessary to determine patient infection status.  Positive results do  not rule out bacterial infection or co-infection with other viruses. If result is PRESUMPTIVE POSTIVE SARS-CoV-2 nucleic acids MAY BE PRESENT.   A presumptive positive result was obtained on the submitted specimen  and confirmed on repeat testing.  While 2019 novel coronavirus  (SARS-CoV-2) nucleic acids may be present in the submitted sample  additional confirmatory testing may be necessary for epidemiological  and / or clinical management purposes  to differentiate between  SARS-CoV-2 and other Sarbecovirus currently known to infect humans.  If clinically indicated additional testing with an alternate test  methodology [phone]) i s advised. The SARS-CoV-2 RNA is generally  detectable in upper and lower respiratory specimens during the acute  phase of infection. The expected result is Negative. Fact Sheet for Patients:   BoilerBrush.com.cy Fact Sheet for Healthcare Providers: https://pope.com/ This test is not yet approved or cleared by the [place] FDA and has been authorized for detection and/or diagnosis of SARS-CoV-2 by FDA under an Emergency Use Authorization (EUA).  This EUA will remain in effect (meaning this test can be used) for the duration of the COVID-19 declaration under Section 564(b)(1) of the Act, 21 U.S.C. section 360bbb-3(b)(1), unless the authorization is terminated or revoked sooner. Performed at [facility], [address], [place], [place] [address]   Culture, Urine     Status: None   Collection Time: 07/13/18  7:50 AM  Result Value Ref Range Status   Specimen Description   Final    URINE, RANDOM Performed at [facility], [address], [place], [place] [address]    Special Requests   Final    NONE Performed at [facility], [address] [place], [place] [address]    Culture   Final    NO GROWTH Performed at [facility], [address], [place], [place] [address]    Report Status 07/14/2018 FINAL  Final  MRSA PCR Screening     Status: None   Collection Time: 07/13/18 10:18 AM  Result Value Ref Range Status   MRSA by PCR NEGATIVE NEGATIVE Final    Comment:        The GeneXpert MRSA Assay (FDA approved for NASAL specimens only), is one component of a comprehensive MRSA colonization surveillance program. It is not intended to diagnose MRSA infection nor to guide or monitor treatment for MRSA infections. Performed at [facility], [address], [place], [place] [address]      Labs: BNP (last 3 results) Recent Labs    07/13/18 0620 07/14/18 1150  BNP 14.0 50.1   Basic Metabolic Panel: Recent Labs  Lab 07/13/18 0620 07/14/18 0447 07/15/18 0603 07/16/18 0325 07/17/18 0500  NA 138 141 143 142 142  K 3.8 4.0  3.8 3.8  3.5  CL 99 100 99 99 102  CO2 29 30 32 31 32  GLUCOSE 117* 94 109* 116* 125*  BUN 13 16 22  24* 23  CREATININE 0.70 0.58 0.61 0.60 0.55  CALCIUM 8.7* 9.1 9.0 9.0 9.1  MG 1.8 1.7 2.0 2.0  --    Liver Function Tests: Recent Labs  Lab 07/13/18 0620 07/14/18 0447 07/15/18 0603 07/16/18 0325 07/17/18 0500  AST 23 40 45* 32 26  ALT 13 26 15 6 6   ALKPHOS 87 84 76 70 60  BILITOT 0.2* 0.5 0.3 0.2* 0.4  PROT 7.5 7.8 7.7 7.4 7.4  ALBUMIN 3.4* 3.5 3.4* 3.4* 3.4*   No results for input(s): LIPASE, AMYLASE in the last 168 hours. No results for input(s): AMMONIA in the last 168 hours. CBC: Recent Labs  Lab 07/13/18 0620 07/14/18 0447 07/15/18 0603 07/16/18 0325 07/17/18 0500  WBC 8.1 4.0 4.2 3.4* 3.7*  NEUTROABS 6.0 2.3 2.4 1.6* 1.8  HGB 12.8 13.1 13.9 13.8 13.8  HCT 42.5 43.2 45.0 45.3 45.6  MCV 96.2 94.3 93.4 94.2 93.4  PLT 195 209 224 240 215   Cardiac Enzymes: Recent Labs  Lab 07/13/18 0432  TROPONINI <0.03   BNP: Invalid input(s): POCBNP CBG: Recent Labs  Lab 07/16/18 1214 07/16/18 1622 07/16/18 1820 07/16/18 2032 07/17/18 0720  GLUCAP 208* 224* 229* 217* 118*   D-Dimer Recent Labs    07/16/18 0325 07/17/18 0500  DDIMER 0.49 0.46   Hgb A1c No results for input(s): HGBA1C in the last 72 hours. Lipid Profile No results for input(s): CHOL, HDL, LDLCALC, TRIG, CHOLHDL, LDLDIRECT in the last 72 hours. Thyroid function studies No results for input(s): TSH, T4TOTAL, T3FREE, THYROIDAB in the last 72 hours.  Invalid input(s): FREET3 Anemia work up Recent Labs    07/16/18 0325 07/17/18 0500  FERRITIN 98 85   Urinalysis    Component Value Date/Time   COLORURINE YELLOW 07/14/2018 1557   APPEARANCEUR HAZY (A) 07/14/2018 1557   LABSPEC 1.017 07/14/2018 1557   PHURINE 5.0 07/14/2018 1557   GLUCOSEU >=500 (A) 07/14/2018 1557   HGBUR MODERATE (A) 07/14/2018 1557   BILIRUBINUR NEGATIVE 07/14/2018 1557   KETONESUR 5 (A) 07/14/2018 1557   PROTEINUR  NEGATIVE 07/14/2018 1557   NITRITE NEGATIVE 07/14/2018 1557   LEUKOCYTESUR SMALL (A) 07/14/2018 1557   Sepsis Labs Invalid input(s): PROCALCITONIN,  WBC,  LACTICIDVEN Microbiology Recent Results (from the past 240 hour(s))  Culture, blood (Routine X 2) w Reflex to ID Panel     Status: None (Preliminary result)   Collection Time: 07/13/18  6:19 AM  Result Value Ref Range Status   Specimen Description   Final    BLOOD RIGHT WRIST Performed at [facility], [address] [place], [place] [address]    Special Requests   Final    BOTTLES DRAWN AEROBIC ONLY Blood Culture adequate volume Performed at [facility], [address] [place], [place] [address]    Culture   Final    NO GROWTH 4 DAYS Performed at [facility], [address], [place], [place] [address]    Report Status PENDING  Incomplete  Culture, blood (Routine X 2) w Reflex to ID Panel     Status: None (Preliminary result)   Collection Time: 07/13/18  6:20 AM  Result Value Ref Range Status   Specimen Description   Final    BLOOD RIGHT HAND Performed at [facility], [address] [place], [place] [address]    Special Requests   Final    BOTTLES DRAWN AEROBIC ONLY Blood Culture adequate volume Performed at [facility], [address], [place], [place] [address]    Culture   Final    NO GROWTH 4 DAYS Performed at [facility], [address], [place], [place] [address]    Report Status PENDING  Incomplete  SARS Coronavirus 2 [facility] order, Performed in [facility] hospital lab)     Status: Abnormal   Collection Time: 07/13/18  6:55 AM  Result Value Ref Range Status   SARS Coronavirus 2 POSITIVE (A) NEGATIVE Final    Comment: RESULT CALLED TO, READ BACK BY AND VERIFIED WITH: K.DUFFY,RN [id number]  BY V.WILKINS (NOTE) If result is NEGATIVE SARS-CoV-2 target nucleic acids are NOT DETECTED. The SARS-CoV-2 RNA is generally  detectable in upper and lower  respiratory specimens during the acute phase of infection. The lowest  concentration of SARS-CoV-2 viral copies this assay can detect is 250  copies / mL. A negative result does not preclude SARS-CoV-2 infection  and should not be used as the sole basis for treatment or other  patient management decisions.  A negative result may occur with  improper specimen collection / handling, submission of specimen other  than nasopharyngeal swab, presence of viral mutation(s) within the  areas targeted by this assay, and inadequate number of viral copies  (<250 copies / mL). A negative result must be combined with clinical  observations, patient history, and epidemiological information. If result is POSITIVE SARS-CoV-2 target nucleic acids are DETECTED.  The SARS-CoV-2 RNA is generally detectable in upper and lower  respiratory specimens during the acute phase of infection.  Positive  results are indicative of active infection with SARS-CoV-2.  Clinical  correlation with patient history and other diagnostic information is  necessary to determine patient infection status.  Positive results do  not rule out bacterial infection or co-infection with other viruses. If result is PRESUMPTIVE POSTIVE SARS-CoV-2 nucleic acids MAY BE PRESENT.   A presumptive positive result was obtained on the submitted specimen  and confirmed on repeat testing.  While 2019 novel coronavirus  (SARS-CoV-2) nucleic acids may be present in the submitted sample  additional confirmatory testing may be necessary for epidemiological  and / or clinical management purposes  to differentiate between  SARS-CoV-2 and other Sarbecovirus currently known to infect humans.  If clinically indicated additional testing with an alternate test  methodology [phone]) i s advised. The SARS-CoV-2 RNA is generally  detectable in upper and lower respiratory specimens during the acute  phase of infection. The  expected result is Negative. Fact Sheet for Patients:  BoilerBrush.com.cy Fact Sheet for Healthcare Providers: https://pope.com/ This test is not yet approved or cleared by the [place] FDA and has been authorized for detection and/or diagnosis of SARS-CoV-2 by FDA under an Emergency Use Authorization (EUA).  This EUA will remain in effect (meaning this test can be used) for the duration of the COVID-19 declaration under Section 564(b)(1) of the Act, 21 U.S.C. section 360bbb-3(b)(1), unless the authorization is terminated or revoked sooner. Performed at [facility], [address] [place], [place] [address]   Culture, Urine     Status: None   Collection Time: 07/13/18  7:50 AM  Result Value Ref Range Status   Specimen Description   Final    URINE, RANDOM Performed at [facility], [address] [place], [place]  [address]    Special Requests   Final    NONE Performed at [facility], [address] [place], [place] [address]    Culture   Final    NO GROWTH Performed at [facility], [address] [place] [address] [place], [place] [address]    Report Status 07/14/2018 FINAL  Final  MRSA PCR Screening     Status: None   Collection Time: 07/13/18 10:18 AM  Result Value Ref Range Status   MRSA by PCR NEGATIVE NEGATIVE Final    Comment:        The GeneXpert MRSA Assay (FDA approved for NASAL specimens only), is one component of a comprehensive MRSA colonization surveillance program. It is not intended to diagnose MRSA infection nor to guide or monitor treatment for MRSA infections. Performed at [facility], [address] [place], [place] [address]      Time coordinating discharge:  SIGNED:   Erick Blinks, MD  [facility] 07/17/2018, 11:21 AM   If 7PM-7AM, please contact night-coverage [email or web address]

## 2018-07-17 NOTE — TOC Transition Note (Addendum)
Transition of Care [facility]) - CM/SW Discharge Note   Patient Details  Name: Jody Young MRN: [id number] Date of Birth: [birth year]  [facility] CM/SW Contact:  Gildardo Griffes, LCSW Phone Number: 07/17/2018, 12:27 PM   Clinical Narrative:     Patient will DC to: [facility] Anticipated DC date: 07/17/2018 Family notified: Brett Canales Transport by: Sharin Mons  Per MD patient ready for DC to [facility] . RN, patient, patient's family, and facility notified of DC. Discharge Summary sent to facility. RN given number for report [phone] [address]. DC packet on chart. Ambulance transport requested for patient for 2:00 pm per nurse request.  CSW signing off.  [place], [place] [phone]   Final next level of care: Skilled Nursing Facility Barriers to Discharge: No Barriers Identified   Patient Goals and CMS Choice Patient states their goals for this hospitalization and ongoing recovery are:: [facility]  CMS Medicare.gov Compare Post Acute Care list provided to:: Patient Represenative (must comment) Choice offered to / list presented to : Spouse([name])  Discharge Placement PASRR number recieved: 07/15/18            Patient [facility] at[facility] Patient to be transferred to facility by: [facility] Name of family member notified: Brett Canales Patient and family notified of of transfer: 07/17/18  Discharge Plan and Services                                     Social Determinants of Health (SDOH) Interventions     Readmission Risk Interventions No flowsheet data found.

## 2018-07-17 NOTE — Discharge Planning (Addendum)
Patient IV X2 removed and wrapped since having difficult clotting.  RN assessment and VS revealed stability for DC to [facility]. Called report and S/w Nickie Retort, LPN.  Signed golden rod DNR and signed Klonopin script sent to facility with AVS.  PTAR contact to transport to room 206A.   I was notified via phone, just prior to to EMS arrival (by husband) to please confirm that patient left with 2 hearing aids.  Unfortunately, after looking, patient did have an aide in the RT ear (clear), but NOT in the LF.  All room drawers, patient belongings bag and linens in room and on bed were checked carefully.  The husband and patient explained that coming into the hospital, "staff found the LF hearing aide to not have a working battery and chose to remove it. "   After looking at notes in the chart, bilateral aides were documented on admission, but no further notes were in the chart, explaining a malfunctioning battery, removal of an aide or where it was placed.  Facility receiving RN was notified that patient may have lost her LF hearing aide while at hospital (Therefore arriving to her with ONLY the RT aide).

## 2018-07-18 LAB — CULTURE, BLOOD (ROUTINE X 2)
Culture: NO GROWTH
Culture: NO GROWTH
Special Requests: ADEQUATE
Special Requests: ADEQUATE

## 2018-07-29 ENCOUNTER — Other Ambulatory Visit: Payer: Self-pay | Admitting: *Deleted

## 2018-07-29 NOTE — Patient Outreach (Addendum)
Member assessed for potential [facility] Care Management needs as a benefit of her [employer] Medicare insurance.  Member is currently at [facility] SNF receiving rehab therapy.  Confirmed with [facility] UM RN that member was a resident of [facility] prior and will transition back to long term care at [facility] upon SNF discharge.   Writer to sign off. No identifiable [facility] Care Management needs at this time.  Marthenia Rolling, MSN-Ed, RN,BSN [place] Acute Care Coordinator [phone]

## 2018-08-25 DIAGNOSIS — F323 Major depressive disorder, single episode, severe with psychotic features: Secondary | ICD-10-CM | POA: Diagnosis not present

## 2018-08-25 DIAGNOSIS — F411 Generalized anxiety disorder: Secondary | ICD-10-CM | POA: Diagnosis not present

## 2018-08-25 DIAGNOSIS — R4189 Other symptoms and signs involving cognitive functions and awareness: Secondary | ICD-10-CM | POA: Diagnosis not present

## 2018-08-26 DIAGNOSIS — I69354 Hemiplegia and hemiparesis following cerebral infarction affecting left non-dominant side: Secondary | ICD-10-CM | POA: Diagnosis not present

## 2018-08-26 DIAGNOSIS — M25642 Stiffness of left hand, not elsewhere classified: Secondary | ICD-10-CM | POA: Diagnosis not present

## 2018-08-26 DIAGNOSIS — G2581 Restless legs syndrome: Secondary | ICD-10-CM | POA: Diagnosis not present

## 2018-08-26 DIAGNOSIS — Z9181 History of falling: Secondary | ICD-10-CM | POA: Diagnosis not present

## 2018-08-26 DIAGNOSIS — N390 Urinary tract infection, site not specified: Secondary | ICD-10-CM | POA: Diagnosis not present

## 2018-08-26 DIAGNOSIS — R2681 Unsteadiness on feet: Secondary | ICD-10-CM | POA: Diagnosis not present

## 2018-08-26 DIAGNOSIS — I2541 Coronary artery aneurysm: Secondary | ICD-10-CM | POA: Diagnosis not present

## 2018-08-26 DIAGNOSIS — R293 Abnormal posture: Secondary | ICD-10-CM | POA: Diagnosis not present

## 2018-08-26 DIAGNOSIS — G894 Chronic pain syndrome: Secondary | ICD-10-CM | POA: Diagnosis not present

## 2018-08-26 DIAGNOSIS — M25622 Stiffness of left elbow, not elsewhere classified: Secondary | ICD-10-CM | POA: Diagnosis not present

## 2018-08-26 DIAGNOSIS — M6281 Muscle weakness (generalized): Secondary | ICD-10-CM | POA: Diagnosis not present

## 2018-08-26 DIAGNOSIS — G20 Parkinson's disease: Secondary | ICD-10-CM | POA: Diagnosis not present

## 2018-08-26 DIAGNOSIS — M25612 Stiffness of left shoulder, not elsewhere classified: Secondary | ICD-10-CM | POA: Diagnosis not present

## 2018-08-26 DIAGNOSIS — J9601 Acute respiratory failure with hypoxia: Secondary | ICD-10-CM | POA: Diagnosis not present

## 2018-08-26 DIAGNOSIS — R1312 Dysphagia, oropharyngeal phase: Secondary | ICD-10-CM | POA: Diagnosis not present

## 2018-08-27 DIAGNOSIS — J9601 Acute respiratory failure with hypoxia: Secondary | ICD-10-CM | POA: Diagnosis not present

## 2018-08-27 DIAGNOSIS — E119 Type 2 diabetes mellitus without complications: Secondary | ICD-10-CM | POA: Diagnosis not present

## 2018-08-27 DIAGNOSIS — G20 Parkinson's disease: Secondary | ICD-10-CM | POA: Diagnosis not present

## 2018-08-27 DIAGNOSIS — E1159 Type 2 diabetes mellitus with other circulatory complications: Secondary | ICD-10-CM | POA: Diagnosis not present

## 2018-08-27 DIAGNOSIS — Z9181 History of falling: Secondary | ICD-10-CM | POA: Diagnosis not present

## 2018-08-27 DIAGNOSIS — I69354 Hemiplegia and hemiparesis following cerebral infarction affecting left non-dominant side: Secondary | ICD-10-CM | POA: Diagnosis not present

## 2018-08-27 DIAGNOSIS — I10 Essential (primary) hypertension: Secondary | ICD-10-CM | POA: Diagnosis not present

## 2018-08-27 DIAGNOSIS — M6281 Muscle weakness (generalized): Secondary | ICD-10-CM | POA: Diagnosis not present

## 2018-08-27 DIAGNOSIS — R2681 Unsteadiness on feet: Secondary | ICD-10-CM | POA: Diagnosis not present

## 2018-09-01 DIAGNOSIS — M6281 Muscle weakness (generalized): Secondary | ICD-10-CM | POA: Diagnosis not present

## 2018-09-01 DIAGNOSIS — R2681 Unsteadiness on feet: Secondary | ICD-10-CM | POA: Diagnosis not present

## 2018-09-01 DIAGNOSIS — Z9181 History of falling: Secondary | ICD-10-CM | POA: Diagnosis not present

## 2018-09-01 DIAGNOSIS — G20 Parkinson's disease: Secondary | ICD-10-CM | POA: Diagnosis not present

## 2018-09-01 DIAGNOSIS — I69354 Hemiplegia and hemiparesis following cerebral infarction affecting left non-dominant side: Secondary | ICD-10-CM | POA: Diagnosis not present

## 2018-09-01 DIAGNOSIS — J9601 Acute respiratory failure with hypoxia: Secondary | ICD-10-CM | POA: Diagnosis not present

## 2018-09-03 DIAGNOSIS — J9601 Acute respiratory failure with hypoxia: Secondary | ICD-10-CM | POA: Diagnosis not present

## 2018-09-03 DIAGNOSIS — I69354 Hemiplegia and hemiparesis following cerebral infarction affecting left non-dominant side: Secondary | ICD-10-CM | POA: Diagnosis not present

## 2018-09-03 DIAGNOSIS — M6281 Muscle weakness (generalized): Secondary | ICD-10-CM | POA: Diagnosis not present

## 2018-09-03 DIAGNOSIS — G20 Parkinson's disease: Secondary | ICD-10-CM | POA: Diagnosis not present

## 2018-09-03 DIAGNOSIS — Z9181 History of falling: Secondary | ICD-10-CM | POA: Diagnosis not present

## 2018-09-03 DIAGNOSIS — R2681 Unsteadiness on feet: Secondary | ICD-10-CM | POA: Diagnosis not present

## 2018-09-04 DIAGNOSIS — J9601 Acute respiratory failure with hypoxia: Secondary | ICD-10-CM | POA: Diagnosis not present

## 2018-09-04 DIAGNOSIS — M6281 Muscle weakness (generalized): Secondary | ICD-10-CM | POA: Diagnosis not present

## 2018-09-04 DIAGNOSIS — G20 Parkinson's disease: Secondary | ICD-10-CM | POA: Diagnosis not present

## 2018-09-04 DIAGNOSIS — I69354 Hemiplegia and hemiparesis following cerebral infarction affecting left non-dominant side: Secondary | ICD-10-CM | POA: Diagnosis not present

## 2018-09-04 DIAGNOSIS — R2681 Unsteadiness on feet: Secondary | ICD-10-CM | POA: Diagnosis not present

## 2018-09-04 DIAGNOSIS — Z9181 History of falling: Secondary | ICD-10-CM | POA: Diagnosis not present

## 2018-09-08 DIAGNOSIS — J9601 Acute respiratory failure with hypoxia: Secondary | ICD-10-CM | POA: Diagnosis not present

## 2018-09-08 DIAGNOSIS — Z9181 History of falling: Secondary | ICD-10-CM | POA: Diagnosis not present

## 2018-09-08 DIAGNOSIS — I69354 Hemiplegia and hemiparesis following cerebral infarction affecting left non-dominant side: Secondary | ICD-10-CM | POA: Diagnosis not present

## 2018-09-08 DIAGNOSIS — R2681 Unsteadiness on feet: Secondary | ICD-10-CM | POA: Diagnosis not present

## 2018-09-08 DIAGNOSIS — G20 Parkinson's disease: Secondary | ICD-10-CM | POA: Diagnosis not present

## 2018-09-08 DIAGNOSIS — M6281 Muscle weakness (generalized): Secondary | ICD-10-CM | POA: Diagnosis not present

## 2018-09-09 DIAGNOSIS — G20 Parkinson's disease: Secondary | ICD-10-CM | POA: Diagnosis not present

## 2018-09-09 DIAGNOSIS — M6281 Muscle weakness (generalized): Secondary | ICD-10-CM | POA: Diagnosis not present

## 2018-09-09 DIAGNOSIS — J9601 Acute respiratory failure with hypoxia: Secondary | ICD-10-CM | POA: Diagnosis not present

## 2018-09-09 DIAGNOSIS — R2681 Unsteadiness on feet: Secondary | ICD-10-CM | POA: Diagnosis not present

## 2018-09-09 DIAGNOSIS — Z9181 History of falling: Secondary | ICD-10-CM | POA: Diagnosis not present

## 2018-09-09 DIAGNOSIS — I69354 Hemiplegia and hemiparesis following cerebral infarction affecting left non-dominant side: Secondary | ICD-10-CM | POA: Diagnosis not present

## 2018-09-10 DIAGNOSIS — J9601 Acute respiratory failure with hypoxia: Secondary | ICD-10-CM | POA: Diagnosis not present

## 2018-09-10 DIAGNOSIS — M6281 Muscle weakness (generalized): Secondary | ICD-10-CM | POA: Diagnosis not present

## 2018-09-10 DIAGNOSIS — Z9181 History of falling: Secondary | ICD-10-CM | POA: Diagnosis not present

## 2018-09-10 DIAGNOSIS — G20 Parkinson's disease: Secondary | ICD-10-CM | POA: Diagnosis not present

## 2018-09-10 DIAGNOSIS — R2681 Unsteadiness on feet: Secondary | ICD-10-CM | POA: Diagnosis not present

## 2018-09-10 DIAGNOSIS — I69354 Hemiplegia and hemiparesis following cerebral infarction affecting left non-dominant side: Secondary | ICD-10-CM | POA: Diagnosis not present

## 2018-09-11 DIAGNOSIS — M6281 Muscle weakness (generalized): Secondary | ICD-10-CM | POA: Diagnosis not present

## 2018-09-11 DIAGNOSIS — J9601 Acute respiratory failure with hypoxia: Secondary | ICD-10-CM | POA: Diagnosis not present

## 2018-09-11 DIAGNOSIS — I69354 Hemiplegia and hemiparesis following cerebral infarction affecting left non-dominant side: Secondary | ICD-10-CM | POA: Diagnosis not present

## 2018-09-11 DIAGNOSIS — Z9181 History of falling: Secondary | ICD-10-CM | POA: Diagnosis not present

## 2018-09-11 DIAGNOSIS — G20 Parkinson's disease: Secondary | ICD-10-CM | POA: Diagnosis not present

## 2018-09-11 DIAGNOSIS — R2681 Unsteadiness on feet: Secondary | ICD-10-CM | POA: Diagnosis not present

## 2018-09-14 DIAGNOSIS — R1312 Dysphagia, oropharyngeal phase: Secondary | ICD-10-CM | POA: Diagnosis not present

## 2018-09-14 DIAGNOSIS — R2681 Unsteadiness on feet: Secondary | ICD-10-CM | POA: Diagnosis not present

## 2018-09-14 DIAGNOSIS — R293 Abnormal posture: Secondary | ICD-10-CM | POA: Diagnosis not present

## 2018-09-14 DIAGNOSIS — G2581 Restless legs syndrome: Secondary | ICD-10-CM | POA: Diagnosis not present

## 2018-09-14 DIAGNOSIS — G894 Chronic pain syndrome: Secondary | ICD-10-CM | POA: Diagnosis not present

## 2018-09-14 DIAGNOSIS — J9601 Acute respiratory failure with hypoxia: Secondary | ICD-10-CM | POA: Diagnosis not present

## 2018-09-14 DIAGNOSIS — G20 Parkinson's disease: Secondary | ICD-10-CM | POA: Diagnosis not present

## 2018-09-14 DIAGNOSIS — M25622 Stiffness of left elbow, not elsewhere classified: Secondary | ICD-10-CM | POA: Diagnosis not present

## 2018-09-14 DIAGNOSIS — M25642 Stiffness of left hand, not elsewhere classified: Secondary | ICD-10-CM | POA: Diagnosis not present

## 2018-09-14 DIAGNOSIS — M6281 Muscle weakness (generalized): Secondary | ICD-10-CM | POA: Diagnosis not present

## 2018-09-14 DIAGNOSIS — I69354 Hemiplegia and hemiparesis following cerebral infarction affecting left non-dominant side: Secondary | ICD-10-CM | POA: Diagnosis not present

## 2018-09-14 DIAGNOSIS — N390 Urinary tract infection, site not specified: Secondary | ICD-10-CM | POA: Diagnosis not present

## 2018-09-14 DIAGNOSIS — I2541 Coronary artery aneurysm: Secondary | ICD-10-CM | POA: Diagnosis not present

## 2018-09-14 DIAGNOSIS — M25612 Stiffness of left shoulder, not elsewhere classified: Secondary | ICD-10-CM | POA: Diagnosis not present

## 2018-09-15 DIAGNOSIS — I69354 Hemiplegia and hemiparesis following cerebral infarction affecting left non-dominant side: Secondary | ICD-10-CM | POA: Diagnosis not present

## 2018-09-15 DIAGNOSIS — M25612 Stiffness of left shoulder, not elsewhere classified: Secondary | ICD-10-CM | POA: Diagnosis not present

## 2018-09-15 DIAGNOSIS — M6281 Muscle weakness (generalized): Secondary | ICD-10-CM | POA: Diagnosis not present

## 2018-09-15 DIAGNOSIS — G20 Parkinson's disease: Secondary | ICD-10-CM | POA: Diagnosis not present

## 2018-09-15 DIAGNOSIS — J9601 Acute respiratory failure with hypoxia: Secondary | ICD-10-CM | POA: Diagnosis not present

## 2018-09-15 DIAGNOSIS — R2681 Unsteadiness on feet: Secondary | ICD-10-CM | POA: Diagnosis not present

## 2018-09-16 DIAGNOSIS — M6281 Muscle weakness (generalized): Secondary | ICD-10-CM | POA: Diagnosis not present

## 2018-09-16 DIAGNOSIS — J9601 Acute respiratory failure with hypoxia: Secondary | ICD-10-CM | POA: Diagnosis not present

## 2018-09-16 DIAGNOSIS — R2681 Unsteadiness on feet: Secondary | ICD-10-CM | POA: Diagnosis not present

## 2018-09-16 DIAGNOSIS — M25612 Stiffness of left shoulder, not elsewhere classified: Secondary | ICD-10-CM | POA: Diagnosis not present

## 2018-09-16 DIAGNOSIS — I69354 Hemiplegia and hemiparesis following cerebral infarction affecting left non-dominant side: Secondary | ICD-10-CM | POA: Diagnosis not present

## 2018-09-16 DIAGNOSIS — G20 Parkinson's disease: Secondary | ICD-10-CM | POA: Diagnosis not present

## 2018-09-17 DIAGNOSIS — R2681 Unsteadiness on feet: Secondary | ICD-10-CM | POA: Diagnosis not present

## 2018-09-17 DIAGNOSIS — J9601 Acute respiratory failure with hypoxia: Secondary | ICD-10-CM | POA: Diagnosis not present

## 2018-09-17 DIAGNOSIS — I69354 Hemiplegia and hemiparesis following cerebral infarction affecting left non-dominant side: Secondary | ICD-10-CM | POA: Diagnosis not present

## 2018-09-17 DIAGNOSIS — M25612 Stiffness of left shoulder, not elsewhere classified: Secondary | ICD-10-CM | POA: Diagnosis not present

## 2018-09-17 DIAGNOSIS — G20 Parkinson's disease: Secondary | ICD-10-CM | POA: Diagnosis not present

## 2018-09-17 DIAGNOSIS — M6281 Muscle weakness (generalized): Secondary | ICD-10-CM | POA: Diagnosis not present

## 2018-09-18 DIAGNOSIS — G20 Parkinson's disease: Secondary | ICD-10-CM | POA: Diagnosis not present

## 2018-09-18 DIAGNOSIS — R2681 Unsteadiness on feet: Secondary | ICD-10-CM | POA: Diagnosis not present

## 2018-09-18 DIAGNOSIS — I69354 Hemiplegia and hemiparesis following cerebral infarction affecting left non-dominant side: Secondary | ICD-10-CM | POA: Diagnosis not present

## 2018-09-18 DIAGNOSIS — J9601 Acute respiratory failure with hypoxia: Secondary | ICD-10-CM | POA: Diagnosis not present

## 2018-09-18 DIAGNOSIS — M6281 Muscle weakness (generalized): Secondary | ICD-10-CM | POA: Diagnosis not present

## 2018-09-18 DIAGNOSIS — M25612 Stiffness of left shoulder, not elsewhere classified: Secondary | ICD-10-CM | POA: Diagnosis not present

## 2018-09-21 DIAGNOSIS — M6281 Muscle weakness (generalized): Secondary | ICD-10-CM | POA: Diagnosis not present

## 2018-09-21 DIAGNOSIS — M25612 Stiffness of left shoulder, not elsewhere classified: Secondary | ICD-10-CM | POA: Diagnosis not present

## 2018-09-21 DIAGNOSIS — J9601 Acute respiratory failure with hypoxia: Secondary | ICD-10-CM | POA: Diagnosis not present

## 2018-09-21 DIAGNOSIS — I69354 Hemiplegia and hemiparesis following cerebral infarction affecting left non-dominant side: Secondary | ICD-10-CM | POA: Diagnosis not present

## 2018-09-21 DIAGNOSIS — G20 Parkinson's disease: Secondary | ICD-10-CM | POA: Diagnosis not present

## 2018-09-21 DIAGNOSIS — R2681 Unsteadiness on feet: Secondary | ICD-10-CM | POA: Diagnosis not present

## 2018-09-22 DIAGNOSIS — F0280 Dementia in other diseases classified elsewhere without behavioral disturbance: Secondary | ICD-10-CM | POA: Diagnosis not present

## 2018-09-22 DIAGNOSIS — M25612 Stiffness of left shoulder, not elsewhere classified: Secondary | ICD-10-CM | POA: Diagnosis not present

## 2018-09-22 DIAGNOSIS — J9601 Acute respiratory failure with hypoxia: Secondary | ICD-10-CM | POA: Diagnosis not present

## 2018-09-22 DIAGNOSIS — F323 Major depressive disorder, single episode, severe with psychotic features: Secondary | ICD-10-CM | POA: Diagnosis not present

## 2018-09-22 DIAGNOSIS — F411 Generalized anxiety disorder: Secondary | ICD-10-CM | POA: Diagnosis not present

## 2018-09-22 DIAGNOSIS — R2681 Unsteadiness on feet: Secondary | ICD-10-CM | POA: Diagnosis not present

## 2018-09-22 DIAGNOSIS — G20 Parkinson's disease: Secondary | ICD-10-CM | POA: Diagnosis not present

## 2018-09-22 DIAGNOSIS — M6281 Muscle weakness (generalized): Secondary | ICD-10-CM | POA: Diagnosis not present

## 2018-09-22 DIAGNOSIS — I69354 Hemiplegia and hemiparesis following cerebral infarction affecting left non-dominant side: Secondary | ICD-10-CM | POA: Diagnosis not present

## 2018-09-24 DIAGNOSIS — M25612 Stiffness of left shoulder, not elsewhere classified: Secondary | ICD-10-CM | POA: Diagnosis not present

## 2018-09-24 DIAGNOSIS — M6281 Muscle weakness (generalized): Secondary | ICD-10-CM | POA: Diagnosis not present

## 2018-09-24 DIAGNOSIS — I69354 Hemiplegia and hemiparesis following cerebral infarction affecting left non-dominant side: Secondary | ICD-10-CM | POA: Diagnosis not present

## 2018-09-24 DIAGNOSIS — R2681 Unsteadiness on feet: Secondary | ICD-10-CM | POA: Diagnosis not present

## 2018-09-24 DIAGNOSIS — G20 Parkinson's disease: Secondary | ICD-10-CM | POA: Diagnosis not present

## 2018-09-24 DIAGNOSIS — J9601 Acute respiratory failure with hypoxia: Secondary | ICD-10-CM | POA: Diagnosis not present

## 2018-09-28 DIAGNOSIS — G20 Parkinson's disease: Secondary | ICD-10-CM | POA: Diagnosis not present

## 2018-09-28 DIAGNOSIS — M25612 Stiffness of left shoulder, not elsewhere classified: Secondary | ICD-10-CM | POA: Diagnosis not present

## 2018-09-28 DIAGNOSIS — I69354 Hemiplegia and hemiparesis following cerebral infarction affecting left non-dominant side: Secondary | ICD-10-CM | POA: Diagnosis not present

## 2018-09-28 DIAGNOSIS — R2681 Unsteadiness on feet: Secondary | ICD-10-CM | POA: Diagnosis not present

## 2018-09-28 DIAGNOSIS — M6281 Muscle weakness (generalized): Secondary | ICD-10-CM | POA: Diagnosis not present

## 2018-09-28 DIAGNOSIS — J9601 Acute respiratory failure with hypoxia: Secondary | ICD-10-CM | POA: Diagnosis not present

## 2018-09-29 DIAGNOSIS — M6281 Muscle weakness (generalized): Secondary | ICD-10-CM | POA: Diagnosis not present

## 2018-09-29 DIAGNOSIS — I69354 Hemiplegia and hemiparesis following cerebral infarction affecting left non-dominant side: Secondary | ICD-10-CM | POA: Diagnosis not present

## 2018-09-29 DIAGNOSIS — M25612 Stiffness of left shoulder, not elsewhere classified: Secondary | ICD-10-CM | POA: Diagnosis not present

## 2018-09-29 DIAGNOSIS — Z79899 Other long term (current) drug therapy: Secondary | ICD-10-CM | POA: Diagnosis not present

## 2018-09-29 DIAGNOSIS — E785 Hyperlipidemia, unspecified: Secondary | ICD-10-CM | POA: Diagnosis not present

## 2018-09-29 DIAGNOSIS — J9601 Acute respiratory failure with hypoxia: Secondary | ICD-10-CM | POA: Diagnosis not present

## 2018-09-29 DIAGNOSIS — R2681 Unsteadiness on feet: Secondary | ICD-10-CM | POA: Diagnosis not present

## 2018-09-29 DIAGNOSIS — G20 Parkinson's disease: Secondary | ICD-10-CM | POA: Diagnosis not present

## 2018-09-30 DIAGNOSIS — G20 Parkinson's disease: Secondary | ICD-10-CM | POA: Diagnosis not present

## 2018-11-02 DIAGNOSIS — G20 Parkinson's disease: Secondary | ICD-10-CM | POA: Diagnosis not present

## 2018-11-02 DIAGNOSIS — I69354 Hemiplegia and hemiparesis following cerebral infarction affecting left non-dominant side: Secondary | ICD-10-CM | POA: Diagnosis not present

## 2018-11-21 DIAGNOSIS — G2581 Restless legs syndrome: Secondary | ICD-10-CM | POA: Diagnosis not present

## 2018-11-21 DIAGNOSIS — I2541 Coronary artery aneurysm: Secondary | ICD-10-CM | POA: Diagnosis not present

## 2018-11-21 DIAGNOSIS — M6281 Muscle weakness (generalized): Secondary | ICD-10-CM | POA: Diagnosis not present

## 2018-11-21 DIAGNOSIS — R293 Abnormal posture: Secondary | ICD-10-CM | POA: Diagnosis not present

## 2018-11-21 DIAGNOSIS — I69354 Hemiplegia and hemiparesis following cerebral infarction affecting left non-dominant side: Secondary | ICD-10-CM | POA: Diagnosis not present

## 2018-11-21 DIAGNOSIS — M25622 Stiffness of left elbow, not elsewhere classified: Secondary | ICD-10-CM | POA: Diagnosis not present

## 2018-11-21 DIAGNOSIS — M25642 Stiffness of left hand, not elsewhere classified: Secondary | ICD-10-CM | POA: Diagnosis not present

## 2018-11-21 DIAGNOSIS — N390 Urinary tract infection, site not specified: Secondary | ICD-10-CM | POA: Diagnosis not present

## 2018-11-21 DIAGNOSIS — M25612 Stiffness of left shoulder, not elsewhere classified: Secondary | ICD-10-CM | POA: Diagnosis not present

## 2018-11-21 DIAGNOSIS — Z9181 History of falling: Secondary | ICD-10-CM | POA: Diagnosis not present

## 2018-11-21 DIAGNOSIS — R1312 Dysphagia, oropharyngeal phase: Secondary | ICD-10-CM | POA: Diagnosis not present

## 2018-11-21 DIAGNOSIS — J9601 Acute respiratory failure with hypoxia: Secondary | ICD-10-CM | POA: Diagnosis not present

## 2018-11-21 DIAGNOSIS — G20 Parkinson's disease: Secondary | ICD-10-CM | POA: Diagnosis not present

## 2018-11-21 DIAGNOSIS — G894 Chronic pain syndrome: Secondary | ICD-10-CM | POA: Diagnosis not present

## 2018-11-21 DIAGNOSIS — R2681 Unsteadiness on feet: Secondary | ICD-10-CM | POA: Diagnosis not present

## 2018-11-22 DIAGNOSIS — J9601 Acute respiratory failure with hypoxia: Secondary | ICD-10-CM | POA: Diagnosis not present

## 2018-11-22 DIAGNOSIS — Z9181 History of falling: Secondary | ICD-10-CM | POA: Diagnosis not present

## 2018-11-22 DIAGNOSIS — G20 Parkinson's disease: Secondary | ICD-10-CM | POA: Diagnosis not present

## 2018-11-22 DIAGNOSIS — I69354 Hemiplegia and hemiparesis following cerebral infarction affecting left non-dominant side: Secondary | ICD-10-CM | POA: Diagnosis not present

## 2018-11-22 DIAGNOSIS — R2681 Unsteadiness on feet: Secondary | ICD-10-CM | POA: Diagnosis not present

## 2018-11-22 DIAGNOSIS — M6281 Muscle weakness (generalized): Secondary | ICD-10-CM | POA: Diagnosis not present

## 2018-11-23 DIAGNOSIS — M6281 Muscle weakness (generalized): Secondary | ICD-10-CM | POA: Diagnosis not present

## 2018-11-23 DIAGNOSIS — J9601 Acute respiratory failure with hypoxia: Secondary | ICD-10-CM | POA: Diagnosis not present

## 2018-11-23 DIAGNOSIS — G20 Parkinson's disease: Secondary | ICD-10-CM | POA: Diagnosis not present

## 2018-11-23 DIAGNOSIS — Z9181 History of falling: Secondary | ICD-10-CM | POA: Diagnosis not present

## 2018-11-23 DIAGNOSIS — I69354 Hemiplegia and hemiparesis following cerebral infarction affecting left non-dominant side: Secondary | ICD-10-CM | POA: Diagnosis not present

## 2018-11-23 DIAGNOSIS — R2681 Unsteadiness on feet: Secondary | ICD-10-CM | POA: Diagnosis not present

## 2018-11-24 DIAGNOSIS — F0280 Dementia in other diseases classified elsewhere without behavioral disturbance: Secondary | ICD-10-CM | POA: Diagnosis not present

## 2018-11-24 DIAGNOSIS — F323 Major depressive disorder, single episode, severe with psychotic features: Secondary | ICD-10-CM | POA: Diagnosis not present

## 2018-11-24 DIAGNOSIS — F411 Generalized anxiety disorder: Secondary | ICD-10-CM | POA: Diagnosis not present

## 2018-11-25 DIAGNOSIS — M6281 Muscle weakness (generalized): Secondary | ICD-10-CM | POA: Diagnosis not present

## 2018-11-25 DIAGNOSIS — G20 Parkinson's disease: Secondary | ICD-10-CM | POA: Diagnosis not present

## 2018-11-25 DIAGNOSIS — Z9181 History of falling: Secondary | ICD-10-CM | POA: Diagnosis not present

## 2018-11-25 DIAGNOSIS — R2681 Unsteadiness on feet: Secondary | ICD-10-CM | POA: Diagnosis not present

## 2018-11-25 DIAGNOSIS — I69354 Hemiplegia and hemiparesis following cerebral infarction affecting left non-dominant side: Secondary | ICD-10-CM | POA: Diagnosis not present

## 2018-11-25 DIAGNOSIS — J9601 Acute respiratory failure with hypoxia: Secondary | ICD-10-CM | POA: Diagnosis not present

## 2018-11-26 DIAGNOSIS — M6281 Muscle weakness (generalized): Secondary | ICD-10-CM | POA: Diagnosis not present

## 2018-11-26 DIAGNOSIS — Z9181 History of falling: Secondary | ICD-10-CM | POA: Diagnosis not present

## 2018-11-26 DIAGNOSIS — J9601 Acute respiratory failure with hypoxia: Secondary | ICD-10-CM | POA: Diagnosis not present

## 2018-11-26 DIAGNOSIS — R2681 Unsteadiness on feet: Secondary | ICD-10-CM | POA: Diagnosis not present

## 2018-11-26 DIAGNOSIS — G20 Parkinson's disease: Secondary | ICD-10-CM | POA: Diagnosis not present

## 2018-11-26 DIAGNOSIS — I69354 Hemiplegia and hemiparesis following cerebral infarction affecting left non-dominant side: Secondary | ICD-10-CM | POA: Diagnosis not present

## 2018-11-30 DIAGNOSIS — J9601 Acute respiratory failure with hypoxia: Secondary | ICD-10-CM | POA: Diagnosis not present

## 2018-11-30 DIAGNOSIS — I69354 Hemiplegia and hemiparesis following cerebral infarction affecting left non-dominant side: Secondary | ICD-10-CM | POA: Diagnosis not present

## 2018-11-30 DIAGNOSIS — Z9181 History of falling: Secondary | ICD-10-CM | POA: Diagnosis not present

## 2018-11-30 DIAGNOSIS — Z20828 Contact with and (suspected) exposure to other viral communicable diseases: Secondary | ICD-10-CM | POA: Diagnosis not present

## 2018-11-30 DIAGNOSIS — M6281 Muscle weakness (generalized): Secondary | ICD-10-CM | POA: Diagnosis not present

## 2018-11-30 DIAGNOSIS — G20 Parkinson's disease: Secondary | ICD-10-CM | POA: Diagnosis not present

## 2018-11-30 DIAGNOSIS — R2681 Unsteadiness on feet: Secondary | ICD-10-CM | POA: Diagnosis not present

## 2018-12-01 DIAGNOSIS — G20 Parkinson's disease: Secondary | ICD-10-CM | POA: Diagnosis not present

## 2018-12-01 DIAGNOSIS — Z9181 History of falling: Secondary | ICD-10-CM | POA: Diagnosis not present

## 2018-12-01 DIAGNOSIS — R2681 Unsteadiness on feet: Secondary | ICD-10-CM | POA: Diagnosis not present

## 2018-12-01 DIAGNOSIS — J9601 Acute respiratory failure with hypoxia: Secondary | ICD-10-CM | POA: Diagnosis not present

## 2018-12-01 DIAGNOSIS — M6281 Muscle weakness (generalized): Secondary | ICD-10-CM | POA: Diagnosis not present

## 2018-12-01 DIAGNOSIS — I69354 Hemiplegia and hemiparesis following cerebral infarction affecting left non-dominant side: Secondary | ICD-10-CM | POA: Diagnosis not present

## 2018-12-02 DIAGNOSIS — M6281 Muscle weakness (generalized): Secondary | ICD-10-CM | POA: Diagnosis not present

## 2018-12-02 DIAGNOSIS — Z9181 History of falling: Secondary | ICD-10-CM | POA: Diagnosis not present

## 2018-12-02 DIAGNOSIS — G20 Parkinson's disease: Secondary | ICD-10-CM | POA: Diagnosis not present

## 2018-12-02 DIAGNOSIS — R2681 Unsteadiness on feet: Secondary | ICD-10-CM | POA: Diagnosis not present

## 2018-12-02 DIAGNOSIS — I69354 Hemiplegia and hemiparesis following cerebral infarction affecting left non-dominant side: Secondary | ICD-10-CM | POA: Diagnosis not present

## 2018-12-02 DIAGNOSIS — J9601 Acute respiratory failure with hypoxia: Secondary | ICD-10-CM | POA: Diagnosis not present

## 2018-12-03 DIAGNOSIS — I69354 Hemiplegia and hemiparesis following cerebral infarction affecting left non-dominant side: Secondary | ICD-10-CM | POA: Diagnosis not present

## 2018-12-03 DIAGNOSIS — J9601 Acute respiratory failure with hypoxia: Secondary | ICD-10-CM | POA: Diagnosis not present

## 2018-12-03 DIAGNOSIS — R2681 Unsteadiness on feet: Secondary | ICD-10-CM | POA: Diagnosis not present

## 2018-12-03 DIAGNOSIS — M6281 Muscle weakness (generalized): Secondary | ICD-10-CM | POA: Diagnosis not present

## 2018-12-03 DIAGNOSIS — Z9181 History of falling: Secondary | ICD-10-CM | POA: Diagnosis not present

## 2018-12-03 DIAGNOSIS — G20 Parkinson's disease: Secondary | ICD-10-CM | POA: Diagnosis not present

## 2018-12-04 DIAGNOSIS — E1159 Type 2 diabetes mellitus with other circulatory complications: Secondary | ICD-10-CM | POA: Diagnosis not present

## 2018-12-04 DIAGNOSIS — J9601 Acute respiratory failure with hypoxia: Secondary | ICD-10-CM | POA: Diagnosis not present

## 2018-12-04 DIAGNOSIS — I69354 Hemiplegia and hemiparesis following cerebral infarction affecting left non-dominant side: Secondary | ICD-10-CM | POA: Diagnosis not present

## 2018-12-04 DIAGNOSIS — Z9181 History of falling: Secondary | ICD-10-CM | POA: Diagnosis not present

## 2018-12-04 DIAGNOSIS — G2581 Restless legs syndrome: Secondary | ICD-10-CM | POA: Diagnosis not present

## 2018-12-04 DIAGNOSIS — M6281 Muscle weakness (generalized): Secondary | ICD-10-CM | POA: Diagnosis not present

## 2018-12-04 DIAGNOSIS — G20 Parkinson's disease: Secondary | ICD-10-CM | POA: Diagnosis not present

## 2018-12-04 DIAGNOSIS — R2681 Unsteadiness on feet: Secondary | ICD-10-CM | POA: Diagnosis not present

## 2018-12-07 DIAGNOSIS — G20 Parkinson's disease: Secondary | ICD-10-CM | POA: Diagnosis not present

## 2018-12-07 DIAGNOSIS — I69354 Hemiplegia and hemiparesis following cerebral infarction affecting left non-dominant side: Secondary | ICD-10-CM | POA: Diagnosis not present

## 2018-12-07 DIAGNOSIS — M6281 Muscle weakness (generalized): Secondary | ICD-10-CM | POA: Diagnosis not present

## 2018-12-07 DIAGNOSIS — J9601 Acute respiratory failure with hypoxia: Secondary | ICD-10-CM | POA: Diagnosis not present

## 2018-12-07 DIAGNOSIS — Z9181 History of falling: Secondary | ICD-10-CM | POA: Diagnosis not present

## 2018-12-07 DIAGNOSIS — R2681 Unsteadiness on feet: Secondary | ICD-10-CM | POA: Diagnosis not present

## 2018-12-08 DIAGNOSIS — I69354 Hemiplegia and hemiparesis following cerebral infarction affecting left non-dominant side: Secondary | ICD-10-CM | POA: Diagnosis not present

## 2018-12-08 DIAGNOSIS — J9601 Acute respiratory failure with hypoxia: Secondary | ICD-10-CM | POA: Diagnosis not present

## 2018-12-08 DIAGNOSIS — G20 Parkinson's disease: Secondary | ICD-10-CM | POA: Diagnosis not present

## 2018-12-08 DIAGNOSIS — R2681 Unsteadiness on feet: Secondary | ICD-10-CM | POA: Diagnosis not present

## 2018-12-08 DIAGNOSIS — M6281 Muscle weakness (generalized): Secondary | ICD-10-CM | POA: Diagnosis not present

## 2018-12-08 DIAGNOSIS — Z9181 History of falling: Secondary | ICD-10-CM | POA: Diagnosis not present

## 2018-12-09 DIAGNOSIS — G20 Parkinson's disease: Secondary | ICD-10-CM | POA: Diagnosis not present

## 2018-12-09 DIAGNOSIS — M6281 Muscle weakness (generalized): Secondary | ICD-10-CM | POA: Diagnosis not present

## 2018-12-09 DIAGNOSIS — R2681 Unsteadiness on feet: Secondary | ICD-10-CM | POA: Diagnosis not present

## 2018-12-09 DIAGNOSIS — Z9181 History of falling: Secondary | ICD-10-CM | POA: Diagnosis not present

## 2018-12-09 DIAGNOSIS — I69354 Hemiplegia and hemiparesis following cerebral infarction affecting left non-dominant side: Secondary | ICD-10-CM | POA: Diagnosis not present

## 2018-12-09 DIAGNOSIS — J9601 Acute respiratory failure with hypoxia: Secondary | ICD-10-CM | POA: Diagnosis not present

## 2018-12-10 DIAGNOSIS — I69354 Hemiplegia and hemiparesis following cerebral infarction affecting left non-dominant side: Secondary | ICD-10-CM | POA: Diagnosis not present

## 2018-12-10 DIAGNOSIS — R2681 Unsteadiness on feet: Secondary | ICD-10-CM | POA: Diagnosis not present

## 2018-12-10 DIAGNOSIS — Z9181 History of falling: Secondary | ICD-10-CM | POA: Diagnosis not present

## 2018-12-10 DIAGNOSIS — G20 Parkinson's disease: Secondary | ICD-10-CM | POA: Diagnosis not present

## 2018-12-10 DIAGNOSIS — J9601 Acute respiratory failure with hypoxia: Secondary | ICD-10-CM | POA: Diagnosis not present

## 2018-12-10 DIAGNOSIS — M6281 Muscle weakness (generalized): Secondary | ICD-10-CM | POA: Diagnosis not present

## 2018-12-11 DIAGNOSIS — J9601 Acute respiratory failure with hypoxia: Secondary | ICD-10-CM | POA: Diagnosis not present

## 2018-12-11 DIAGNOSIS — Z9181 History of falling: Secondary | ICD-10-CM | POA: Diagnosis not present

## 2018-12-11 DIAGNOSIS — R2681 Unsteadiness on feet: Secondary | ICD-10-CM | POA: Diagnosis not present

## 2018-12-11 DIAGNOSIS — M6281 Muscle weakness (generalized): Secondary | ICD-10-CM | POA: Diagnosis not present

## 2018-12-11 DIAGNOSIS — I69354 Hemiplegia and hemiparesis following cerebral infarction affecting left non-dominant side: Secondary | ICD-10-CM | POA: Diagnosis not present

## 2018-12-11 DIAGNOSIS — G20 Parkinson's disease: Secondary | ICD-10-CM | POA: Diagnosis not present

## 2018-12-14 DIAGNOSIS — Z20828 Contact with and (suspected) exposure to other viral communicable diseases: Secondary | ICD-10-CM | POA: Diagnosis not present

## 2018-12-15 DIAGNOSIS — Z9181 History of falling: Secondary | ICD-10-CM | POA: Diagnosis not present

## 2018-12-15 DIAGNOSIS — I69354 Hemiplegia and hemiparesis following cerebral infarction affecting left non-dominant side: Secondary | ICD-10-CM | POA: Diagnosis not present

## 2018-12-15 DIAGNOSIS — G2581 Restless legs syndrome: Secondary | ICD-10-CM | POA: Diagnosis not present

## 2018-12-15 DIAGNOSIS — I2541 Coronary artery aneurysm: Secondary | ICD-10-CM | POA: Diagnosis not present

## 2018-12-15 DIAGNOSIS — G894 Chronic pain syndrome: Secondary | ICD-10-CM | POA: Diagnosis not present

## 2018-12-15 DIAGNOSIS — J9601 Acute respiratory failure with hypoxia: Secondary | ICD-10-CM | POA: Diagnosis not present

## 2018-12-15 DIAGNOSIS — M6281 Muscle weakness (generalized): Secondary | ICD-10-CM | POA: Diagnosis not present

## 2018-12-15 DIAGNOSIS — M25622 Stiffness of left elbow, not elsewhere classified: Secondary | ICD-10-CM | POA: Diagnosis not present

## 2018-12-15 DIAGNOSIS — M25612 Stiffness of left shoulder, not elsewhere classified: Secondary | ICD-10-CM | POA: Diagnosis not present

## 2018-12-15 DIAGNOSIS — N390 Urinary tract infection, site not specified: Secondary | ICD-10-CM | POA: Diagnosis not present

## 2018-12-15 DIAGNOSIS — R293 Abnormal posture: Secondary | ICD-10-CM | POA: Diagnosis not present

## 2018-12-15 DIAGNOSIS — R1312 Dysphagia, oropharyngeal phase: Secondary | ICD-10-CM | POA: Diagnosis not present

## 2018-12-15 DIAGNOSIS — G20 Parkinson's disease: Secondary | ICD-10-CM | POA: Diagnosis not present

## 2018-12-15 DIAGNOSIS — R2681 Unsteadiness on feet: Secondary | ICD-10-CM | POA: Diagnosis not present

## 2018-12-15 DIAGNOSIS — M25642 Stiffness of left hand, not elsewhere classified: Secondary | ICD-10-CM | POA: Diagnosis not present

## 2018-12-16 DIAGNOSIS — J9601 Acute respiratory failure with hypoxia: Secondary | ICD-10-CM | POA: Diagnosis not present

## 2018-12-16 DIAGNOSIS — I69354 Hemiplegia and hemiparesis following cerebral infarction affecting left non-dominant side: Secondary | ICD-10-CM | POA: Diagnosis not present

## 2018-12-16 DIAGNOSIS — M6281 Muscle weakness (generalized): Secondary | ICD-10-CM | POA: Diagnosis not present

## 2018-12-16 DIAGNOSIS — G20 Parkinson's disease: Secondary | ICD-10-CM | POA: Diagnosis not present

## 2018-12-16 DIAGNOSIS — R2681 Unsteadiness on feet: Secondary | ICD-10-CM | POA: Diagnosis not present

## 2018-12-16 DIAGNOSIS — Z9181 History of falling: Secondary | ICD-10-CM | POA: Diagnosis not present

## 2018-12-18 DIAGNOSIS — I69354 Hemiplegia and hemiparesis following cerebral infarction affecting left non-dominant side: Secondary | ICD-10-CM | POA: Diagnosis not present

## 2018-12-18 DIAGNOSIS — Z9181 History of falling: Secondary | ICD-10-CM | POA: Diagnosis not present

## 2018-12-18 DIAGNOSIS — M6281 Muscle weakness (generalized): Secondary | ICD-10-CM | POA: Diagnosis not present

## 2018-12-18 DIAGNOSIS — J9601 Acute respiratory failure with hypoxia: Secondary | ICD-10-CM | POA: Diagnosis not present

## 2018-12-18 DIAGNOSIS — G20 Parkinson's disease: Secondary | ICD-10-CM | POA: Diagnosis not present

## 2018-12-18 DIAGNOSIS — R2681 Unsteadiness on feet: Secondary | ICD-10-CM | POA: Diagnosis not present

## 2018-12-21 DIAGNOSIS — M6281 Muscle weakness (generalized): Secondary | ICD-10-CM | POA: Diagnosis not present

## 2018-12-21 DIAGNOSIS — G20 Parkinson's disease: Secondary | ICD-10-CM | POA: Diagnosis not present

## 2018-12-21 DIAGNOSIS — R2681 Unsteadiness on feet: Secondary | ICD-10-CM | POA: Diagnosis not present

## 2018-12-21 DIAGNOSIS — J9601 Acute respiratory failure with hypoxia: Secondary | ICD-10-CM | POA: Diagnosis not present

## 2018-12-21 DIAGNOSIS — I69354 Hemiplegia and hemiparesis following cerebral infarction affecting left non-dominant side: Secondary | ICD-10-CM | POA: Diagnosis not present

## 2018-12-21 DIAGNOSIS — Z20828 Contact with and (suspected) exposure to other viral communicable diseases: Secondary | ICD-10-CM | POA: Diagnosis not present

## 2018-12-21 DIAGNOSIS — Z9181 History of falling: Secondary | ICD-10-CM | POA: Diagnosis not present

## 2018-12-22 DIAGNOSIS — Z9181 History of falling: Secondary | ICD-10-CM | POA: Diagnosis not present

## 2018-12-22 DIAGNOSIS — M6281 Muscle weakness (generalized): Secondary | ICD-10-CM | POA: Diagnosis not present

## 2018-12-22 DIAGNOSIS — F411 Generalized anxiety disorder: Secondary | ICD-10-CM | POA: Diagnosis not present

## 2018-12-22 DIAGNOSIS — R2681 Unsteadiness on feet: Secondary | ICD-10-CM | POA: Diagnosis not present

## 2018-12-22 DIAGNOSIS — I69354 Hemiplegia and hemiparesis following cerebral infarction affecting left non-dominant side: Secondary | ICD-10-CM | POA: Diagnosis not present

## 2018-12-22 DIAGNOSIS — F0280 Dementia in other diseases classified elsewhere without behavioral disturbance: Secondary | ICD-10-CM | POA: Diagnosis not present

## 2018-12-22 DIAGNOSIS — G20 Parkinson's disease: Secondary | ICD-10-CM | POA: Diagnosis not present

## 2018-12-22 DIAGNOSIS — F323 Major depressive disorder, single episode, severe with psychotic features: Secondary | ICD-10-CM | POA: Diagnosis not present

## 2018-12-22 DIAGNOSIS — J9601 Acute respiratory failure with hypoxia: Secondary | ICD-10-CM | POA: Diagnosis not present

## 2018-12-23 DIAGNOSIS — J9601 Acute respiratory failure with hypoxia: Secondary | ICD-10-CM | POA: Diagnosis not present

## 2018-12-23 DIAGNOSIS — R2681 Unsteadiness on feet: Secondary | ICD-10-CM | POA: Diagnosis not present

## 2018-12-23 DIAGNOSIS — I69354 Hemiplegia and hemiparesis following cerebral infarction affecting left non-dominant side: Secondary | ICD-10-CM | POA: Diagnosis not present

## 2018-12-23 DIAGNOSIS — M6281 Muscle weakness (generalized): Secondary | ICD-10-CM | POA: Diagnosis not present

## 2018-12-23 DIAGNOSIS — Z9181 History of falling: Secondary | ICD-10-CM | POA: Diagnosis not present

## 2018-12-23 DIAGNOSIS — G20 Parkinson's disease: Secondary | ICD-10-CM | POA: Diagnosis not present

## 2018-12-24 DIAGNOSIS — M6281 Muscle weakness (generalized): Secondary | ICD-10-CM | POA: Diagnosis not present

## 2018-12-24 DIAGNOSIS — G20 Parkinson's disease: Secondary | ICD-10-CM | POA: Diagnosis not present

## 2018-12-24 DIAGNOSIS — I69354 Hemiplegia and hemiparesis following cerebral infarction affecting left non-dominant side: Secondary | ICD-10-CM | POA: Diagnosis not present

## 2018-12-24 DIAGNOSIS — Z9181 History of falling: Secondary | ICD-10-CM | POA: Diagnosis not present

## 2018-12-24 DIAGNOSIS — J9601 Acute respiratory failure with hypoxia: Secondary | ICD-10-CM | POA: Diagnosis not present

## 2018-12-24 DIAGNOSIS — R2681 Unsteadiness on feet: Secondary | ICD-10-CM | POA: Diagnosis not present

## 2018-12-25 DIAGNOSIS — M6281 Muscle weakness (generalized): Secondary | ICD-10-CM | POA: Diagnosis not present

## 2018-12-25 DIAGNOSIS — Z9181 History of falling: Secondary | ICD-10-CM | POA: Diagnosis not present

## 2018-12-25 DIAGNOSIS — I69354 Hemiplegia and hemiparesis following cerebral infarction affecting left non-dominant side: Secondary | ICD-10-CM | POA: Diagnosis not present

## 2018-12-25 DIAGNOSIS — J9601 Acute respiratory failure with hypoxia: Secondary | ICD-10-CM | POA: Diagnosis not present

## 2018-12-25 DIAGNOSIS — R2681 Unsteadiness on feet: Secondary | ICD-10-CM | POA: Diagnosis not present

## 2018-12-25 DIAGNOSIS — G20 Parkinson's disease: Secondary | ICD-10-CM | POA: Diagnosis not present

## 2018-12-28 DIAGNOSIS — M6281 Muscle weakness (generalized): Secondary | ICD-10-CM | POA: Diagnosis not present

## 2018-12-28 DIAGNOSIS — G20 Parkinson's disease: Secondary | ICD-10-CM | POA: Diagnosis not present

## 2018-12-28 DIAGNOSIS — J9601 Acute respiratory failure with hypoxia: Secondary | ICD-10-CM | POA: Diagnosis not present

## 2018-12-28 DIAGNOSIS — I69354 Hemiplegia and hemiparesis following cerebral infarction affecting left non-dominant side: Secondary | ICD-10-CM | POA: Diagnosis not present

## 2018-12-28 DIAGNOSIS — R2681 Unsteadiness on feet: Secondary | ICD-10-CM | POA: Diagnosis not present

## 2018-12-28 DIAGNOSIS — Z9181 History of falling: Secondary | ICD-10-CM | POA: Diagnosis not present

## 2018-12-29 DIAGNOSIS — R2681 Unsteadiness on feet: Secondary | ICD-10-CM | POA: Diagnosis not present

## 2018-12-29 DIAGNOSIS — Z9181 History of falling: Secondary | ICD-10-CM | POA: Diagnosis not present

## 2018-12-29 DIAGNOSIS — J9601 Acute respiratory failure with hypoxia: Secondary | ICD-10-CM | POA: Diagnosis not present

## 2018-12-29 DIAGNOSIS — I69354 Hemiplegia and hemiparesis following cerebral infarction affecting left non-dominant side: Secondary | ICD-10-CM | POA: Diagnosis not present

## 2018-12-29 DIAGNOSIS — M6281 Muscle weakness (generalized): Secondary | ICD-10-CM | POA: Diagnosis not present

## 2018-12-29 DIAGNOSIS — G20 Parkinson's disease: Secondary | ICD-10-CM | POA: Diagnosis not present

## 2018-12-30 DIAGNOSIS — Z9181 History of falling: Secondary | ICD-10-CM | POA: Diagnosis not present

## 2018-12-30 DIAGNOSIS — I69354 Hemiplegia and hemiparesis following cerebral infarction affecting left non-dominant side: Secondary | ICD-10-CM | POA: Diagnosis not present

## 2018-12-30 DIAGNOSIS — G20 Parkinson's disease: Secondary | ICD-10-CM | POA: Diagnosis not present

## 2018-12-30 DIAGNOSIS — J9601 Acute respiratory failure with hypoxia: Secondary | ICD-10-CM | POA: Diagnosis not present

## 2018-12-30 DIAGNOSIS — M6281 Muscle weakness (generalized): Secondary | ICD-10-CM | POA: Diagnosis not present

## 2018-12-30 DIAGNOSIS — R2681 Unsteadiness on feet: Secondary | ICD-10-CM | POA: Diagnosis not present

## 2018-12-31 DIAGNOSIS — M6281 Muscle weakness (generalized): Secondary | ICD-10-CM | POA: Diagnosis not present

## 2018-12-31 DIAGNOSIS — I69354 Hemiplegia and hemiparesis following cerebral infarction affecting left non-dominant side: Secondary | ICD-10-CM | POA: Diagnosis not present

## 2018-12-31 DIAGNOSIS — G20 Parkinson's disease: Secondary | ICD-10-CM | POA: Diagnosis not present

## 2018-12-31 DIAGNOSIS — J9601 Acute respiratory failure with hypoxia: Secondary | ICD-10-CM | POA: Diagnosis not present

## 2018-12-31 DIAGNOSIS — R2681 Unsteadiness on feet: Secondary | ICD-10-CM | POA: Diagnosis not present

## 2018-12-31 DIAGNOSIS — Z9181 History of falling: Secondary | ICD-10-CM | POA: Diagnosis not present

## 2019-01-01 DIAGNOSIS — J9601 Acute respiratory failure with hypoxia: Secondary | ICD-10-CM | POA: Diagnosis not present

## 2019-01-01 DIAGNOSIS — Z9181 History of falling: Secondary | ICD-10-CM | POA: Diagnosis not present

## 2019-01-01 DIAGNOSIS — I69354 Hemiplegia and hemiparesis following cerebral infarction affecting left non-dominant side: Secondary | ICD-10-CM | POA: Diagnosis not present

## 2019-01-01 DIAGNOSIS — R2681 Unsteadiness on feet: Secondary | ICD-10-CM | POA: Diagnosis not present

## 2019-01-01 DIAGNOSIS — M6281 Muscle weakness (generalized): Secondary | ICD-10-CM | POA: Diagnosis not present

## 2019-01-01 DIAGNOSIS — G20 Parkinson's disease: Secondary | ICD-10-CM | POA: Diagnosis not present

## 2019-01-04 DIAGNOSIS — R2681 Unsteadiness on feet: Secondary | ICD-10-CM | POA: Diagnosis not present

## 2019-01-04 DIAGNOSIS — J9601 Acute respiratory failure with hypoxia: Secondary | ICD-10-CM | POA: Diagnosis not present

## 2019-01-04 DIAGNOSIS — Z9181 History of falling: Secondary | ICD-10-CM | POA: Diagnosis not present

## 2019-01-04 DIAGNOSIS — I69354 Hemiplegia and hemiparesis following cerebral infarction affecting left non-dominant side: Secondary | ICD-10-CM | POA: Diagnosis not present

## 2019-01-04 DIAGNOSIS — I10 Essential (primary) hypertension: Secondary | ICD-10-CM | POA: Diagnosis not present

## 2019-01-04 DIAGNOSIS — G20 Parkinson's disease: Secondary | ICD-10-CM | POA: Diagnosis not present

## 2019-01-04 DIAGNOSIS — E1159 Type 2 diabetes mellitus with other circulatory complications: Secondary | ICD-10-CM | POA: Diagnosis not present

## 2019-01-04 DIAGNOSIS — M6281 Muscle weakness (generalized): Secondary | ICD-10-CM | POA: Diagnosis not present

## 2019-01-05 DIAGNOSIS — R2681 Unsteadiness on feet: Secondary | ICD-10-CM | POA: Diagnosis not present

## 2019-01-05 DIAGNOSIS — I69354 Hemiplegia and hemiparesis following cerebral infarction affecting left non-dominant side: Secondary | ICD-10-CM | POA: Diagnosis not present

## 2019-01-05 DIAGNOSIS — G20 Parkinson's disease: Secondary | ICD-10-CM | POA: Diagnosis not present

## 2019-01-05 DIAGNOSIS — Z9181 History of falling: Secondary | ICD-10-CM | POA: Diagnosis not present

## 2019-01-05 DIAGNOSIS — J9601 Acute respiratory failure with hypoxia: Secondary | ICD-10-CM | POA: Diagnosis not present

## 2019-01-05 DIAGNOSIS — M6281 Muscle weakness (generalized): Secondary | ICD-10-CM | POA: Diagnosis not present

## 2019-01-06 DIAGNOSIS — G20 Parkinson's disease: Secondary | ICD-10-CM | POA: Diagnosis not present

## 2019-01-06 DIAGNOSIS — J9601 Acute respiratory failure with hypoxia: Secondary | ICD-10-CM | POA: Diagnosis not present

## 2019-01-06 DIAGNOSIS — M6281 Muscle weakness (generalized): Secondary | ICD-10-CM | POA: Diagnosis not present

## 2019-01-06 DIAGNOSIS — I69354 Hemiplegia and hemiparesis following cerebral infarction affecting left non-dominant side: Secondary | ICD-10-CM | POA: Diagnosis not present

## 2019-01-06 DIAGNOSIS — Z9181 History of falling: Secondary | ICD-10-CM | POA: Diagnosis not present

## 2019-01-06 DIAGNOSIS — R2681 Unsteadiness on feet: Secondary | ICD-10-CM | POA: Diagnosis not present

## 2019-01-07 DIAGNOSIS — Z9181 History of falling: Secondary | ICD-10-CM | POA: Diagnosis not present

## 2019-01-07 DIAGNOSIS — M6281 Muscle weakness (generalized): Secondary | ICD-10-CM | POA: Diagnosis not present

## 2019-01-07 DIAGNOSIS — G20 Parkinson's disease: Secondary | ICD-10-CM | POA: Diagnosis not present

## 2019-01-07 DIAGNOSIS — R2681 Unsteadiness on feet: Secondary | ICD-10-CM | POA: Diagnosis not present

## 2019-01-07 DIAGNOSIS — I69354 Hemiplegia and hemiparesis following cerebral infarction affecting left non-dominant side: Secondary | ICD-10-CM | POA: Diagnosis not present

## 2019-01-07 DIAGNOSIS — J9601 Acute respiratory failure with hypoxia: Secondary | ICD-10-CM | POA: Diagnosis not present

## 2019-01-08 DIAGNOSIS — R2681 Unsteadiness on feet: Secondary | ICD-10-CM | POA: Diagnosis not present

## 2019-01-08 DIAGNOSIS — M6281 Muscle weakness (generalized): Secondary | ICD-10-CM | POA: Diagnosis not present

## 2019-01-08 DIAGNOSIS — I69354 Hemiplegia and hemiparesis following cerebral infarction affecting left non-dominant side: Secondary | ICD-10-CM | POA: Diagnosis not present

## 2019-01-08 DIAGNOSIS — J9601 Acute respiratory failure with hypoxia: Secondary | ICD-10-CM | POA: Diagnosis not present

## 2019-01-08 DIAGNOSIS — G20 Parkinson's disease: Secondary | ICD-10-CM | POA: Diagnosis not present

## 2019-01-08 DIAGNOSIS — Z9181 History of falling: Secondary | ICD-10-CM | POA: Diagnosis not present

## 2019-01-09 DIAGNOSIS — M6281 Muscle weakness (generalized): Secondary | ICD-10-CM | POA: Diagnosis not present

## 2019-01-09 DIAGNOSIS — G20 Parkinson's disease: Secondary | ICD-10-CM | POA: Diagnosis not present

## 2019-01-09 DIAGNOSIS — I69354 Hemiplegia and hemiparesis following cerebral infarction affecting left non-dominant side: Secondary | ICD-10-CM | POA: Diagnosis not present

## 2019-01-09 DIAGNOSIS — Z9181 History of falling: Secondary | ICD-10-CM | POA: Diagnosis not present

## 2019-01-09 DIAGNOSIS — R2681 Unsteadiness on feet: Secondary | ICD-10-CM | POA: Diagnosis not present

## 2019-01-09 DIAGNOSIS — J9601 Acute respiratory failure with hypoxia: Secondary | ICD-10-CM | POA: Diagnosis not present

## 2019-01-12 DIAGNOSIS — Z20828 Contact with and (suspected) exposure to other viral communicable diseases: Secondary | ICD-10-CM | POA: Diagnosis not present

## 2019-01-19 DIAGNOSIS — J189 Pneumonia, unspecified organism: Secondary | ICD-10-CM | POA: Diagnosis not present

## 2019-01-19 DIAGNOSIS — E119 Type 2 diabetes mellitus without complications: Secondary | ICD-10-CM | POA: Diagnosis not present

## 2019-01-21 DIAGNOSIS — D649 Anemia, unspecified: Secondary | ICD-10-CM | POA: Diagnosis not present

## 2019-01-21 DIAGNOSIS — R4182 Altered mental status, unspecified: Secondary | ICD-10-CM | POA: Diagnosis not present

## 2019-01-21 DIAGNOSIS — R0602 Shortness of breath: Secondary | ICD-10-CM | POA: Diagnosis not present

## 2019-01-21 DIAGNOSIS — U071 COVID-19: Secondary | ICD-10-CM | POA: Diagnosis not present

## 2019-01-22 ENCOUNTER — Inpatient Hospital Stay (HOSPITAL_COMMUNITY)
Admission: AD | Admit: 2019-01-22 | Discharge: 2019-01-26 | DRG: 871 | Disposition: A | Discharge status: Skilled Nursing Facility | Payer: Medicare Other | Source: Other Acute Inpatient Hospital | Attending: Family Medicine | Admitting: Family Medicine

## 2019-01-22 ENCOUNTER — Encounter (HOSPITAL_COMMUNITY): Payer: Self-pay | Admitting: Internal Medicine

## 2019-01-22 DIAGNOSIS — J1289 Other viral pneumonia: Secondary | ICD-10-CM | POA: Diagnosis present

## 2019-01-22 DIAGNOSIS — U071 COVID-19: Secondary | ICD-10-CM | POA: Diagnosis present

## 2019-01-22 DIAGNOSIS — Z888 Allergy status to other drugs, medicaments and biological substances status: Secondary | ICD-10-CM

## 2019-01-22 DIAGNOSIS — Z7902 Long term (current) use of antithrombotics/antiplatelets: Secondary | ICD-10-CM

## 2019-01-22 DIAGNOSIS — J159 Unspecified bacterial pneumonia: Secondary | ICD-10-CM | POA: Diagnosis present

## 2019-01-22 DIAGNOSIS — G9341 Metabolic encephalopathy: Secondary | ICD-10-CM | POA: Diagnosis present

## 2019-01-22 DIAGNOSIS — R6520 Severe sepsis without septic shock: Secondary | ICD-10-CM | POA: Diagnosis present

## 2019-01-22 DIAGNOSIS — G8929 Other chronic pain: Secondary | ICD-10-CM | POA: Diagnosis present

## 2019-01-22 DIAGNOSIS — J1282 Pneumonia due to coronavirus disease 2019: Secondary | ICD-10-CM | POA: Diagnosis present

## 2019-01-22 DIAGNOSIS — E1165 Type 2 diabetes mellitus with hyperglycemia: Secondary | ICD-10-CM | POA: Diagnosis present

## 2019-01-22 DIAGNOSIS — IMO0002 Reserved for concepts with insufficient information to code with codable children: Secondary | ICD-10-CM | POA: Diagnosis present

## 2019-01-22 DIAGNOSIS — M25612 Stiffness of left shoulder, not elsewhere classified: Secondary | ICD-10-CM | POA: Diagnosis not present

## 2019-01-22 DIAGNOSIS — Z87891 Personal history of nicotine dependence: Secondary | ICD-10-CM

## 2019-01-22 DIAGNOSIS — F329 Major depressive disorder, single episode, unspecified: Secondary | ICD-10-CM | POA: Diagnosis present

## 2019-01-22 DIAGNOSIS — L8990 Pressure ulcer of unspecified site, unspecified stage: Secondary | ICD-10-CM | POA: Insufficient documentation

## 2019-01-22 DIAGNOSIS — Z8249 Family history of ischemic heart disease and other diseases of the circulatory system: Secondary | ICD-10-CM

## 2019-01-22 DIAGNOSIS — D638 Anemia in other chronic diseases classified elsewhere: Secondary | ICD-10-CM | POA: Diagnosis present

## 2019-01-22 DIAGNOSIS — F0280 Dementia in other diseases classified elsewhere without behavioral disturbance: Secondary | ICD-10-CM | POA: Diagnosis present

## 2019-01-22 DIAGNOSIS — I69354 Hemiplegia and hemiparesis following cerebral infarction affecting left non-dominant side: Secondary | ICD-10-CM | POA: Diagnosis not present

## 2019-01-22 DIAGNOSIS — Z955 Presence of coronary angioplasty implant and graft: Secondary | ICD-10-CM

## 2019-01-22 DIAGNOSIS — G2581 Restless legs syndrome: Secondary | ICD-10-CM | POA: Diagnosis not present

## 2019-01-22 DIAGNOSIS — E872 Acidosis: Secondary | ICD-10-CM | POA: Diagnosis present

## 2019-01-22 DIAGNOSIS — G20 Parkinson's disease: Secondary | ICD-10-CM | POA: Diagnosis present

## 2019-01-22 DIAGNOSIS — I2510 Atherosclerotic heart disease of native coronary artery without angina pectoris: Secondary | ICD-10-CM | POA: Diagnosis present

## 2019-01-22 DIAGNOSIS — I10 Essential (primary) hypertension: Secondary | ICD-10-CM | POA: Diagnosis not present

## 2019-01-22 DIAGNOSIS — Z8619 Personal history of other infectious and parasitic diseases: Secondary | ICD-10-CM

## 2019-01-22 DIAGNOSIS — L89151 Pressure ulcer of sacral region, stage 1: Secondary | ICD-10-CM | POA: Diagnosis present

## 2019-01-22 DIAGNOSIS — Z79899 Other long term (current) drug therapy: Secondary | ICD-10-CM

## 2019-01-22 DIAGNOSIS — Z9181 History of falling: Secondary | ICD-10-CM | POA: Diagnosis not present

## 2019-01-22 DIAGNOSIS — J189 Pneumonia, unspecified organism: Secondary | ICD-10-CM | POA: Diagnosis not present

## 2019-01-22 DIAGNOSIS — R4182 Altered mental status, unspecified: Secondary | ICD-10-CM | POA: Diagnosis not present

## 2019-01-22 DIAGNOSIS — Z7982 Long term (current) use of aspirin: Secondary | ICD-10-CM

## 2019-01-22 DIAGNOSIS — G894 Chronic pain syndrome: Secondary | ICD-10-CM | POA: Diagnosis not present

## 2019-01-22 DIAGNOSIS — R471 Dysarthria and anarthria: Secondary | ICD-10-CM | POA: Diagnosis not present

## 2019-01-22 DIAGNOSIS — A4189 Other specified sepsis: Secondary | ICD-10-CM | POA: Diagnosis present

## 2019-01-22 DIAGNOSIS — Y95 Nosocomial condition: Secondary | ICD-10-CM | POA: Diagnosis present

## 2019-01-22 DIAGNOSIS — D649 Anemia, unspecified: Secondary | ICD-10-CM | POA: Diagnosis not present

## 2019-01-22 DIAGNOSIS — Z66 Do not resuscitate: Secondary | ICD-10-CM | POA: Diagnosis present

## 2019-01-22 DIAGNOSIS — I2541 Coronary artery aneurysm: Secondary | ICD-10-CM | POA: Diagnosis not present

## 2019-01-22 DIAGNOSIS — E118 Type 2 diabetes mellitus with unspecified complications: Secondary | ICD-10-CM | POA: Diagnosis present

## 2019-01-22 DIAGNOSIS — R1312 Dysphagia, oropharyngeal phase: Secondary | ICD-10-CM | POA: Diagnosis not present

## 2019-01-22 DIAGNOSIS — I129 Hypertensive chronic kidney disease with stage 1 through stage 4 chronic kidney disease, or unspecified chronic kidney disease: Secondary | ICD-10-CM | POA: Diagnosis present

## 2019-01-22 DIAGNOSIS — F419 Anxiety disorder, unspecified: Secondary | ICD-10-CM | POA: Diagnosis present

## 2019-01-22 DIAGNOSIS — N1831 Chronic kidney disease, stage 3a: Secondary | ICD-10-CM | POA: Diagnosis present

## 2019-01-22 DIAGNOSIS — R41841 Cognitive communication deficit: Secondary | ICD-10-CM | POA: Diagnosis not present

## 2019-01-22 DIAGNOSIS — E1140 Type 2 diabetes mellitus with diabetic neuropathy, unspecified: Secondary | ICD-10-CM | POA: Diagnosis not present

## 2019-01-22 DIAGNOSIS — M25642 Stiffness of left hand, not elsewhere classified: Secondary | ICD-10-CM | POA: Diagnosis not present

## 2019-01-22 DIAGNOSIS — M6281 Muscle weakness (generalized): Secondary | ICD-10-CM | POA: Diagnosis not present

## 2019-01-22 DIAGNOSIS — J9601 Acute respiratory failure with hypoxia: Secondary | ICD-10-CM | POA: Diagnosis present

## 2019-01-22 DIAGNOSIS — M25622 Stiffness of left elbow, not elsewhere classified: Secondary | ICD-10-CM | POA: Diagnosis not present

## 2019-01-22 DIAGNOSIS — E1122 Type 2 diabetes mellitus with diabetic chronic kidney disease: Secondary | ICD-10-CM | POA: Diagnosis present

## 2019-01-22 DIAGNOSIS — Z7984 Long term (current) use of oral hypoglycemic drugs: Secondary | ICD-10-CM

## 2019-01-22 HISTORY — DX: Parkinson's disease: G20

## 2019-01-22 HISTORY — DX: Atherosclerotic heart disease of native coronary artery without angina pectoris: I25.10

## 2019-01-22 HISTORY — DX: Depression, unspecified: F32.A

## 2019-01-22 HISTORY — DX: Parkinson's disease without dyskinesia, without mention of fluctuations: G20.A1

## 2019-01-22 HISTORY — DX: Cerebral infarction, unspecified: I63.9

## 2019-01-22 HISTORY — DX: Other chronic pain: G89.29

## 2019-01-22 LAB — COMPREHENSIVE METABOLIC PANEL
ALT: 19 U/L (ref 0–44)
AST: 15 U/L (ref 15–41)
Albumin: 2.3 g/dL — ABNORMAL LOW (ref 3.5–5.0)
Alkaline Phosphatase: 111 U/L (ref 38–126)
Anion gap: 15 (ref 5–15)
BUN: 13 mg/dL (ref 8–23)
CO2: 36 mmol/L — ABNORMAL HIGH (ref 22–32)
Calcium: 9 mg/dL (ref 8.9–10.3)
Chloride: 93 mmol/L — ABNORMAL LOW (ref 98–111)
Creatinine, Ser: 0.53 mg/dL (ref 0.44–1.00)
GFR calc Af Amer: >60 mL/min (ref >60)
GFR calc non Af Amer: >60 mL/min (ref >60)
Glucose, Bld: 150 mg/dL — ABNORMAL HIGH (ref 70–99)
Potassium: 3.8 mmol/L (ref 3.5–5.1)
Sodium: 144 mmol/L (ref 135–145)
Total Bilirubin: 0.6 mg/dL (ref 0.3–1.2)
Total Protein: 7.7 g/dL (ref 6.5–8.1)

## 2019-01-22 LAB — BRAIN NATRIURETIC PEPTIDE: B Natriuretic Peptide: 69.6 pg/mL (ref 0.0–100.0)

## 2019-01-22 LAB — CBC WITH DIFFERENTIAL/PLATELET
Abs Immature Granulocytes: 0.18 10*3/uL — ABNORMAL HIGH (ref 0.00–0.07)
Basophils Absolute: 0.1 10*3/uL (ref 0.0–0.1)
Basophils Relative: 1 %
Eosinophils Absolute: 0 10*3/uL (ref 0.0–0.5)
Eosinophils Relative: 0 %
HCT: 36.8 % (ref 36.0–46.0)
Hemoglobin: 10.8 g/dL — ABNORMAL LOW (ref 12.0–15.0)
Immature Granulocytes: 1 %
Lymphocytes Relative: 9 %
Lymphs Abs: 1.1 10*3/uL (ref 0.7–4.0)
MCH: 28.1 pg (ref 26.0–34.0)
MCHC: 29.3 g/dL — ABNORMAL LOW (ref 30.0–36.0)
MCV: 95.8 fL (ref 80.0–100.0)
Monocytes Absolute: 0.3 10*3/uL (ref 0.1–1.0)
Monocytes Relative: 2 %
Neutro Abs: 10.9 10*3/uL — ABNORMAL HIGH (ref 1.7–7.7)
Neutrophils Relative %: 87 %
Platelets: 391 10*3/uL (ref 150–400)
RBC: 3.84 MIL/uL — ABNORMAL LOW (ref 3.87–5.11)
RDW: 12.8 % (ref 11.5–15.5)
WBC: 12.5 10*3/uL — ABNORMAL HIGH (ref 4.0–10.5)
nRBC: 0 % (ref 0.0–0.2)

## 2019-01-22 LAB — FIBRINOGEN: Fibrinogen: >800 mg/dL — ABNORMAL HIGH (ref 210–475)

## 2019-01-22 LAB — SAMPLE TO BLOOD BANK

## 2019-01-22 LAB — PROCALCITONIN: Procalcitonin: 0.26 ng/mL

## 2019-01-22 LAB — FERRITIN: Ferritin: 154 ng/mL (ref 11–307)

## 2019-01-22 LAB — GLUCOSE, CAPILLARY
Glucose-Capillary: 153 mg/dL — ABNORMAL HIGH (ref 70–99)
Glucose-Capillary: 116 mg/dL — ABNORMAL HIGH (ref 70–99)

## 2019-01-22 LAB — D-DIMER, QUANTITATIVE: D-Dimer, Quant: 4.73 ug/mL-FEU — ABNORMAL HIGH (ref 0.00–0.50)

## 2019-01-22 LAB — C-REACTIVE PROTEIN: CRP: 25.1 mg/dL — ABNORMAL HIGH (ref <1.0)

## 2019-01-22 MED ORDER — VANCOMYCIN HCL 10 G IV SOLR
1750.0000 mg | INTRAVENOUS | Status: DC
  Administered 2019-01-23 – 2019-01-25 (×2): 1750 mg via INTRAVENOUS
  Filled 2019-01-22 (×2): qty 1750

## 2019-01-22 MED ORDER — CARBIDOPA-LEVODOPA 25-100 MG PO TABS
1.0000 | ORAL_TABLET | ORAL | Status: DC
  Administered 2019-01-22 – 2019-01-26 (×14): 1 via ORAL
  Filled 2019-01-22 (×19): qty 1

## 2019-01-22 MED ORDER — DEXAMETHASONE 6 MG PO TABS
6.0000 mg | ORAL_TABLET | ORAL | Status: DC
  Administered 2019-01-22 – 2019-01-26 (×5): 6 mg via ORAL
  Filled 2019-01-22 (×5): qty 1

## 2019-01-22 MED ORDER — SODIUM CHLORIDE 0.9 % IV SOLN: 100.0000 mg | Freq: Every day | INTRAVENOUS | Status: DC

## 2019-01-22 MED ORDER — ENSURE ENLIVE PO LIQD
237.0000 mL | Freq: Two times a day (BID) | ORAL | Status: DC
  Administered 2019-01-25 – 2019-01-26 (×2): 237 mL via ORAL

## 2019-01-22 MED ORDER — JUVEN PO PACK
1.0000 | PACK | Freq: Two times a day (BID) | ORAL | Status: DC
  Administered 2019-01-23 – 2019-01-26 (×5): 1 via ORAL
  Filled 2019-01-22 (×8): qty 1

## 2019-01-22 MED ORDER — SODIUM CHLORIDE 0.9 % IV SOLN
100.0000 mg | Freq: Every day | INTRAVENOUS | Status: AC
  Administered 2019-01-22 – 2019-01-25 (×4): 100 mg via INTRAVENOUS
  Filled 2019-01-22 (×4): qty 20

## 2019-01-22 MED ORDER — ACETAMINOPHEN 325 MG PO TABS
650.0000 mg | ORAL_TABLET | Freq: Four times a day (QID) | ORAL | Status: DC | PRN
  Administered 2019-01-23 – 2019-01-26 (×6): 650 mg via ORAL
  Filled 2019-01-22 (×6): qty 2

## 2019-01-22 MED ORDER — GUAIFENESIN-DM 100-10 MG/5ML PO SYRP
10.0000 mL | ORAL_SOLUTION | ORAL | Status: DC | PRN
  Administered 2019-01-24: 10 mL via ORAL
  Filled 2019-01-22: qty 10

## 2019-01-22 MED ORDER — POLYETHYLENE GLYCOL 3350 17 G PO PACK
17.0000 g | PACK | Freq: Every day | ORAL | Status: DC
  Administered 2019-01-22 – 2019-01-26 (×5): 17 g via ORAL
  Filled 2019-01-22 (×5): qty 1

## 2019-01-22 MED ORDER — CLOPIDOGREL BISULFATE 75 MG PO TABS
75.0000 mg | ORAL_TABLET | Freq: Every day | ORAL | Status: DC
  Administered 2019-01-22 – 2019-01-26 (×5): 75 mg via ORAL
  Filled 2019-01-22 (×5): qty 1

## 2019-01-22 MED ORDER — SODIUM CHLORIDE 0.9 % IV SOLN
2.0000 g | Freq: Two times a day (BID) | INTRAVENOUS | Status: DC
  Administered 2019-01-22 – 2019-01-25 (×7): 2 g via INTRAVENOUS
  Filled 2019-01-22 (×7): qty 2

## 2019-01-22 MED ORDER — IPRATROPIUM-ALBUTEROL 20-100 MCG/ACT IN AERS
1.0000 | INHALATION_SPRAY | Freq: Four times a day (QID) | RESPIRATORY_TRACT | Status: DC
  Administered 2019-01-22 – 2019-01-26 (×16): 1 via RESPIRATORY_TRACT
  Filled 2019-01-22: qty 4

## 2019-01-22 MED ORDER — DULOXETINE HCL 20 MG PO CPEP
20.0000 mg | ORAL_CAPSULE | Freq: Every day | ORAL | Status: DC
  Administered 2019-01-22 – 2019-01-26 (×5): 20 mg via ORAL
  Filled 2019-01-22 (×5): qty 1

## 2019-01-22 MED ORDER — MECLIZINE HCL 12.5 MG PO TABS
12.5000 mg | ORAL_TABLET | Freq: Every day | ORAL | Status: DC
  Administered 2019-01-22 – 2019-01-26 (×5): 12.5 mg via ORAL
  Filled 2019-01-22 (×5): qty 1

## 2019-01-22 MED ORDER — ENOXAPARIN SODIUM 40 MG/0.4ML ~~LOC~~ SOLN
40.0000 mg | Freq: Every day | SUBCUTANEOUS | Status: DC
  Administered 2019-01-22 – 2019-01-25 (×4): 40 mg via SUBCUTANEOUS
  Filled 2019-01-22 (×4): qty 0.4

## 2019-01-22 MED ORDER — PRAVASTATIN SODIUM 10 MG PO TABS
20.0000 mg | ORAL_TABLET | Freq: Every day | ORAL | Status: DC
  Administered 2019-01-22 – 2019-01-25 (×4): 20 mg via ORAL
  Filled 2019-01-22 (×6): qty 2

## 2019-01-22 MED ORDER — ASPIRIN 81 MG PO CHEW
81.0000 mg | CHEWABLE_TABLET | Freq: Every day | ORAL | Status: DC
  Administered 2019-01-22 – 2019-01-26 (×5): 81 mg via ORAL
  Filled 2019-01-22 (×5): qty 1

## 2019-01-22 MED ORDER — PRAMIPEXOLE DIHYDROCHLORIDE 0.25 MG PO TABS
0.5000 mg | ORAL_TABLET | Freq: Two times a day (BID) | ORAL | Status: DC
  Administered 2019-01-22 – 2019-01-26 (×9): 0.5 mg via ORAL
  Filled 2019-01-22 (×10): qty 2

## 2019-01-22 MED ORDER — PANTOPRAZOLE SODIUM 40 MG PO TBEC
40.0000 mg | DELAYED_RELEASE_TABLET | Freq: Every day | ORAL | Status: DC
  Administered 2019-01-22 – 2019-01-26 (×5): 40 mg via ORAL
  Filled 2019-01-22 (×5): qty 1

## 2019-01-22 MED ORDER — DOCUSATE SODIUM 100 MG PO CAPS
100.0000 mg | ORAL_CAPSULE | Freq: Every day | ORAL | Status: DC
  Administered 2019-01-22 – 2019-01-26 (×5): 100 mg via ORAL
  Filled 2019-01-22 (×5): qty 1

## 2019-01-22 MED ORDER — BUSPIRONE HCL 10 MG PO TABS
10.0000 mg | ORAL_TABLET | Freq: Two times a day (BID) | ORAL | Status: DC
  Administered 2019-01-22 – 2019-01-26 (×8): 10 mg via ORAL
  Filled 2019-01-22 (×12): qty 1

## 2019-01-22 MED ORDER — VITAMIN C 500 MG PO TABS
500.0000 mg | ORAL_TABLET | Freq: Every day | ORAL | Status: DC
  Administered 2019-01-22 – 2019-01-26 (×5): 500 mg via ORAL
  Filled 2019-01-22 (×4): qty 1

## 2019-01-22 MED ORDER — CLONAZEPAM 0.5 MG PO TBDP
0.5000 mg | ORAL_TABLET | Freq: Two times a day (BID) | ORAL | Status: DC | PRN
  Administered 2019-01-22 – 2019-01-24 (×4): 0.5 mg via ORAL
  Filled 2019-01-22 (×4): qty 1

## 2019-01-22 MED ORDER — ZINC SULFATE 220 (50 ZN) MG PO CAPS
220.0000 mg | ORAL_CAPSULE | Freq: Every day | ORAL | Status: DC
  Administered 2019-01-22 – 2019-01-26 (×4): 220 mg via ORAL
  Filled 2019-01-22 (×4): qty 1

## 2019-01-22 MED ORDER — AMLODIPINE BESYLATE 10 MG PO TABS
10.0000 mg | ORAL_TABLET | Freq: Every day | ORAL | Status: DC
  Administered 2019-01-22 – 2019-01-26 (×5): 10 mg via ORAL
  Filled 2019-01-22 (×5): qty 1

## 2019-01-22 MED ORDER — SODIUM CHLORIDE 0.9 % IV SOLN: 200.0000 mg | Freq: Once | INTRAVENOUS | Status: DC

## 2019-01-22 MED ORDER — HYDROCOD POLST-CPM POLST ER 10-8 MG/5ML PO SUER
5.0000 mL | Freq: Two times a day (BID) | ORAL | Status: DC | PRN
  Filled 2019-01-22: qty 5

## 2019-01-22 NOTE — H&P (Signed)
Jody Young H&P   Patient Demographics:    Jody Young, is a 73 y.o. female  MRN: [id number]   DOB - Oct 20, 1945  Admit Date - 01/22/2019  Outpatient Primary MD for the patient is Patient, No Pcp Per  Patient coming from: [facility] health  No chief complaint on file.     HPI:    Jody Young  is a 73 y.o. female, with Parkinson's disease, prior CVA with residual left-sided hemiparesis, depression, anxiety, CAD, NIDDM, chronic pain who presents as a transfer from [place] for management of Covid pneumonia.  Patient is nonverbal, reportedly received Haldol prior to transfer, history obtained from reviewing the medical chart and discussions with the ER staff.  Patient resides in a SNF, is tested regularly for SARS-CoV-2, was sent to the hospital after developing fever, shortness of breath.  She was tested for SARS-CoV-2 and found to be positive, repeat test in the emergency room was also positive.  Of note patient was on the hospitalist service 6/1-07/17/2018 after presenting with similar symptomatology, at that time she was treated with remdesivir and steroids, weaned down to 2 L and discharged home.  No further history is available at this time   Review of systems:  Review of Systems: Unable to assess due to patient's current mental status  With Past History of the following :   Past Medical History:  Diagnosis Date  . Chronic pain   . Coronary artery disease   . CVA (cerebral vascular accident) ([place])   . Depression   . Parkinson's disease ([place])       History reviewed. No pertinent surgical history.   Social History:     Social History   Tobacco Use  . Smoking status: Former Research scientist (life sciences)  . Smokeless tobacco:  Never Used  Substance Use Topics  . Alcohol use: Not on file     Family History :  Family History  Problem Relation Age of Onset  . Hypertension Mother     Home Medications:   Prior to Admission medications   Medication Sig Start Date End Date Taking? Authorizing Provider  acetaminophen (TYLENOL) 325 MG tablet Take 650 mg by mouth every 6 (six) hours as needed for mild pain or moderate pain.    [provider]  Albuterol Sulfate 2.5 MG/0.5ML NEBU Inhale 1 each into the lungs every 4 (four) hours as needed (wheezing).    [provider]  amLODipine (NORVASC) 10 MG tablet Take 10 mg by mouth daily. 10/02/17   [provider]  aspirin 81 MG chewable tablet Chew 81 mg by mouth daily.    [provider]  busPIRone (BUSPAR) 10 MG tablet Take 10 mg by mouth 2 (two) times a day. 09/25/17   [provider]  carbidopa-levodopa (SINEMET) 25-100 MG tablet Take 1 tablet by mouth 3 (three) times daily. 9 am/ 1 pm / 9 pm 11/06/15   [provider]  clonazePAM (KLONOPIN) 0.5 MG disintegrating tablet Take 1 tablet (0.5 mg total) by mouth 2 (two) times daily. 07/17/18   Erick Blinks, MD  clopidogrel (PLAVIX) 75 MG tablet Take 75 mg by mouth daily. 09/26/17   [provider]  dapagliflozin propanediol (FARXIGA) 5 MG TABS tablet Take 5 mg by mouth daily.    [provider]  docusate sodium (COLACE) 100 MG capsule Take 100 mg by mouth daily.    [provider]  DULoxetine (CYMBALTA) 20 MG capsule Take 20 mg by mouth daily. 09/23/17   [provider]  furosemide (LASIX) 20 MG tablet Take 20 mg by mouth every other day. 09/14/17   [provider]  meclizine (ANTIVERT) 12.5 MG tablet Take 12.5 mg by mouth daily.    [provider]  metFORMIN (GLUCOPHAGE) 500 MG tablet Take by mouth 2 (two) times a day.    [provider]  Multiple Vitamins-Minerals (CEROVITE SENIOR) TABS Take 1 tablet by mouth  daily.    [provider]  nitroGLYCERIN (NITROSTAT) 0.4 MG SL tablet Place 0.4 mg under the tongue See admin instructions. Every 5 minutes up to 3 doses for chest pain.    [provider]  pantoprazole (PROTONIX) 40 MG tablet Take 40 mg by mouth daily.    [provider]  Pitavastatin Calcium (LIVALO) 2 MG TABS Take 2 mg by mouth daily.    [provider]  polyethylene glycol (MIRALAX / GLYCOLAX) 17 g packet Take 17 g by mouth daily as needed for mild constipation or moderate constipation.    [provider]  pramipexole (MIRAPEX) 0.5 MG tablet Take 0.5 mg by mouth 2 (two) times a day. 09/25/17   [provider]  predniSONE (DELTASONE) 10 MG tablet Take 40mg  po daily for 2 days then 30mg  daily for 2 days then 20mg  daily for 2 days then 10mg  daily for 2 days then stop 07/17/18   , MD  vitamin C (ASCORBIC ACID) 500 MG tablet Take 500 mg by mouth daily.    [provider]     Allergies:     Allergies  Allergen Reactions  . Astemizole Other (See Comments)    Hallucinations Hallucinations   . Niacin Other (See Comments)    Listed on MAR     Physical Exam:   Vitals  Blood pressure (!) 152/73, temperature (!) 97.5 F (36.4 C), temperature source Axillary, weight 61.4 kg.  Physical Exam   Constitutional -  resting comfortably, no acute distress Eyes - pupils equal round and reactive to light and accomodation, extra ocular movements intact Nose - no gross deformity or drainage Mouth - no oral lesions noted Throat - no swelling or erythema Neck - supple, no JVD   CV - (+)S1S2, no murmurs  Resp -diffuse rales and rhonchi,  GI - (+)BS, soft, non-tender, non-distended Extrem - no clubbing, cyanosis, or peripheral edema  Skin - no rashes or wounds Neuro -opens eyes to verbal and tactile stimuli, does not follow commands for palpable response Psych -unable to assess  Patient has Pressure Ulcer on Admission?:  no   Data Review:    CBC No results for input(s): WBC, HGB, HCT, PLT, MCV, MCH, MCHC, RDW, LYMPHSABS, MONOABS, EOSABS, BASOSABS, BANDABS in the last 168 hours.  Invalid input(s): NEUTRABS, BANDSABD ------------------------------------------------------------------------------------------------------------------  Chemistries  No results for input(s): NA, K, CL, CO2, GLUCOSE, BUN, CREATININE, CALCIUM, MG, AST, ALT, ALKPHOS, BILITOT in the last 168 hours.  Invalid input(s): GFRCGP ------------------------------------------------------------------------------------------------------------------ CrCl cannot be calculated (Patient's most recent lab result is older than the maximum 21 days allowed.). ------------------------------------------------------------------------------------------------------------------ No results for input(s): TSH, T4TOTAL, T3FREE, THYROIDAB in the last 72 hours.  Invalid input(s): FREET3  Coagulation profile No results for input(s): INR, PROTIME in the last 168 hours. ------------------------------------------------------------------------------------------------------------------- No results for input(s): DDIMER in the last 72 hours. -------------------------------------------------------------------------------------------------------------------  Cardiac Enzymes No results for input(s): CKMB, TROPONINI, MYOGLOBIN in the last 168 hours.  Invalid input(s): CK ------------------------------------------------------------------------------------------------------------------    Component Value Date/Time   BNP 50.1 07/14/2018 1150     ---------------------------------------------------------------------------------------------------------------  Urinalysis    Component Value Date/Time   COLORURINE YELLOW 07/14/2018 1557   APPEARANCEUR HAZY (A) 07/14/2018 1557   LABSPEC 1.017 07/14/2018 1557   PHURINE 5.0 07/14/2018 1557   GLUCOSEU >=500 (A) 07/14/2018  1557   HGBUR MODERATE (A) 07/14/2018 1557   BILIRUBINUR NEGATIVE 07/14/2018 1557   KETONESUR 5 (A) 07/14/2018 1557   PROTEINUR NEGATIVE 07/14/2018 1557   NITRITE NEGATIVE 07/14/2018 1557   LEUKOCYTESUR SMALL (A) 07/14/2018 1557    ----------------------------------------------------------------------------------------------------------------   Imaging Results:    No results found.   Assessment & Plan:    Principal Problem:   Acute respiratory failure with hypoxia (HCC) Active Problems:   Pneumonia due to COVID-19 virus   Dementia due to Parkinson's disease without behavioral disturbance (HCC)   Essential hypertension   Diabetes mellitus type 2, uncontrolled, with complications (HCC)    Acute respiratory failure with hypoxia/Pneumonia due to COVID-19 virus: Patient was treated and recovered from the SARS-CoV-2 infection in June 2020, presents with apparent reinfection.  Admitted for supportive care. Date of Dx: 01/21/2019 Oxygen requirements:  50% Ventimask Antibiotics: Received IV antibiotics at OSH, continue if procalcitonin is elevated otherwise hold off antibiotics Diuretics: Lasix Vitamin C and Zinc: Per protocol Remdesivir: Started on 01/21/2019 Steroids: Started on 01/21/2019 Actemra: Not given yet Convalescent Plasma: Not given yet DVT Prophylaxis:   Lovenox    Dementia due to Parkinson's disease without behavioral disturbance: Unable to assess the current mental status due to recent administration of Haldol.  Continue BuSpar, Sinemet, as needed clonazepam, Cymbalta    Essential hypertension: Blood pressure is close to goal.  Continue amlodipine, aspirin.    Diabetes mellitus type 2, uncontrolled, with complications: Holding oral diabetes medications.  Fingersticks before meals and at bedtime.  Sliding scale insulin.   DVT Prophylaxis  Lovenox  AM Labs Ordered, also please review Full Orders  Code Status DNR  Likely DC to SNF  Condition GUARDED    Consults  called: None  Admission status: Admit to inpatient  Time spent in minutes : 55   Coletta Memosrinity Brailynn Breth M.D on 01/22/2019 at 7:06 AM  To page go to [email or web address] - password [email or web address]

## 2019-01-22 NOTE — Progress Notes (Signed)
   01/22/19 0900  Family/Significant Other Communication  Family/Significant Other Update Called;Updated  Updated Husband and pt. All questions asked and all questions answered. Needs met.

## 2019-01-22 NOTE — Progress Notes (Signed)
PROGRESS NOTE  Jody ManeSusan W Young  XWR:[id number] DOB: 10-28-1945 DOA: 01/22/2019 PCP: Patient, No Pcp Per   Brief Narrative: Jody Young is a 73 y.o. female with a history of Parkinson's disease, dementia, T2DM, CVA with residual left hemiparesis, HTN, and covid-19 infection who [facility] from SNF [facility] The patient was treated for covid-19 pneumonia in June, discharged in improved condition with 2L O2 and dad continued on 2L O2 since discharge. She was reportedly being treated with antibiotics recently for pneumonia but on the day of presentation, she was found to be hypoxic to 77% and febrile to 103F, so was placed on NRB and sent to ED. In ED 102.69F, HR 127bpm, RR 46/min, placed on 40% FiO2 through ventimask with SpO2 94%. WBC 15.5k, hgb 10.7 (baseline appears to be ~13), platelets 478. ABG on 60% FiO2 initially demonstrated pH 7.370, CO2 74, pO2 129, later redrawn on 40% with 7.410/66/76. SARS-CoV-2 PCR was positive. CXR read as hazy opacities identified in the right mid and lower lung with small right pleural effusion, clear left lung. Troponin 0.02. ALT 10, Cr 0.50. CRP >270, LDH and ferritin actually normal. UA negative for leuk esterase, nitrite, blood.  Goals of care discussion with the patient's husband by EDP due to patient reportedly shouting "I want to die," though goal remains curative intent with continued DNR. Patient required haldol due to agitation in ED, and [facility].   Assessment & Plan: Principal Problem:   Acute respiratory failure with hypoxia (HCC) Active Problems:   Pneumonia due to COVID-19 virus   Dementia due to Parkinson's disease without behavioral disturbance (HCC)   Essential hypertension   Diabetes mellitus type 2, uncontrolled, with complications (HCC)   Pressure injury of skin  Acute hypoxic respiratory failure due to recurrent covid-19 pneumonia complicated by RML and RLL HCAP: Was admitted and treated for  covid-19 with remdesivir and steroids 6/1 - 6/5, discharged on 2L O2 with prednisone taper to SNF. She has had serial testing at the facility, reportedly negative weekly including a couple days before admission, so this represents reinfection with superimposed bacterial component.  - CRP severely elevated. Will continue repeat course of remdesivir (12/10 - 12/14). Follow ALT.  - PCT also elevated with focal infiltrate on initial CXR confined to RML, RLL. Will also provide with empiric abx (continue vancomycin and cefepime), check MRSA PCR, and trend PCT. Check SLP eval with AMS and CVA hx. - Continue supplemental oxygen as tolerated, currently 12L. Will consult palliative care as she is DNR on admission and at high risk of poor outcome. - Continue steroids. Monitor inflammatory markers. - Continue airborne, contact precautions. PPE including surgical gown, gloves, cap, shoe covers, and CAPR used during this encounter in a negative pressure room.  - Continue chemical DVT ppx. D-dimer elevated, will trend. - Maintain euvolemia/net negative.  - Avoid NSAIDs - Recommend proning and aggressive use of incentive spirometry.  Severe sepsis due to covid-19 pneumonia, and superimposed bacterial HCAP:  - [facility] - Abx as above - Tx covid as above.  - HyPERtensive at this time, will defer IVF with concomitant covid.  Acute metabolic encephalopathy: Ammonia 14. Note normal pH on ABG's despite hypercarbia. With elevated bicarb, this would be consistent with compensated respiratory acidosis. - Delirium precautions - Treat sepsis and covid as above.   CAD s/p stent x2:  - Antiplatelet + statin  History of CVA with residual left hemiparesis:  -  Continue DAPT, statin  T2DM: HbA1c 7.1%.  - SSI - Holding invokana and metformin at this time.   Parkinson's Disease:  - Continue sinemet.   History of urinary retention: Monitor UOP  HTN:  - Continue nebivolol and norvasc as she is  hypertensive.   History of tobacco abuse: Noted in EMR.   Anemia of chronic disease: Possibly.  - Monitor  Leukocytosis, thrombocytosis: More consistent with bacterial infection than uncomplicated covid.  - Monitor.  Anxiety:  - Klonopin and buspar continued at admission.  RN Pressure Injury Documentation: Pressure Injury 01/22/19 Sacrum Mid;Upper Stage I -  Intact skin with non-blanchable redness of a localized area usually over a bony prominence. stage 1 on sacrum  (Active)  01/22/19 0626  Location: Sacrum  Location Orientation: Mid;Upper  Staging: Stage I -  Intact skin with non-blanchable redness of a localized area usually over a bony prominence.  Wound Description (Comments): stage 1 on sacrum   Present on Admission: Yes   DVT prophylaxis: Lovenox Code Status: DNR. Gold form in chart was signed in 2018.  Family Communication: Will speak with husband today Disposition Plan: Uncertain  Consultants:   Palliative care  Procedures:   None  Antimicrobials:  Vancomycin, cefepime, remdesivir   Subjective: Not cogent at this time.  Objective: Vitals:   01/22/19 0521 01/22/19 0753 01/22/19 1100  BP: (!) 152/73 133/75   Pulse:  (!) 107   Resp:  20   Temp: (!) 97.5 F (36.4 C) 98.4 F (36.9 C)   TempSrc: Axillary Axillary   SpO2:  97%   Weight: 61.4 kg    Height:   5\' 4"  (1.626 m)   No intake or output data in the 24 hours ending 01/22/19 1636 Filed Weights   01/22/19 0521  Weight: 61.4 kg    Gen: 73 y.o. female in no distress Pulm: Non-labored breathing 12LPM w/mask. Crackles at right base.  CV: Regular rate and rhythm. No murmur, rub, or gallop. No JVD, no pedal edema. GI: Abdomen soft, non-tender, non-distended, with normoactive bowel sounds. No organomegaly or masses felt. Ext: Warm, no deformities Skin: No rashes, lesions or ulcers Neuro: Alert and oriented. No focal neurological deficits. Psych: Judgement and insight appear normal. Mood & affect  appropriate.   Data Reviewed: I have personally reviewed following labs and imaging studies  CBC: Recent Labs  Lab 01/22/19 0717  WBC 12.5*  NEUTROABS 10.9*  HGB 10.8*  HCT 36.8  MCV 95.8  PLT 130   Basic Metabolic Panel: Recent Labs  Lab 01/22/19 0717  NA 144  K 3.8  CL 93*  CO2 36*  GLUCOSE 150*  BUN 13  CREATININE 0.53  CALCIUM 9.0   GFR: Estimated Creatinine Clearance: 52.5 mL/min (by C-G formula based on SCr of 0.53 mg/dL). Liver Function Tests: Recent Labs  Lab 01/22/19 0717  AST 15  ALT 19  ALKPHOS 111  BILITOT 0.6  PROT 7.7  ALBUMIN 2.3*   Anemia Panel: Recent Labs    01/22/19 0717  FERRITIN 154   Scheduled Meds: . amLODipine  10 mg Oral Daily  . aspirin  81 mg Oral Daily  . busPIRone  10 mg Oral BID  . carbidopa-levodopa  1 tablet Oral 3 times per day  . clopidogrel  75 mg Oral Daily  . dexamethasone  6 mg Oral Q24H  . docusate sodium  100 mg Oral Daily  . DULoxetine  20 mg Oral Daily  . enoxaparin (LOVENOX) injection  40 mg Subcutaneous QHS  . [  START ON 01/23/2019] feeding supplement (ENSURE ENLIVE)  237 mL Oral BID BM  . Ipratropium-Albuterol  1 puff Inhalation Q6H  . meclizine  12.5 mg Oral Daily  . [START ON 01/23/2019] nutrition supplement (JUVEN)  1 packet Oral BID BM  . pantoprazole  40 mg Oral Daily  . polyethylene glycol  17 g Oral Daily  . pramipexole  0.5 mg Oral BID  . pravastatin  20 mg Oral q1800  . vitamin C  500 mg Oral Daily  . zinc sulfate  220 mg Oral Daily   Continuous Infusions: . ceFEPime (MAXIPIME) IV 2 g (01/22/19 1305)  . remdesivir 100 mg in NS 100 mL 100 mg (01/22/19 1137)  . [START ON 01/23/2019] vancomycin       LOS: 0 days   Time spent: 35 minutes.  Tyrone Nine, MD [facility] [email or web address] 01/22/2019, 4:36 PM

## 2019-01-22 NOTE — Progress Notes (Signed)
Initial Nutrition Assessment  DOCUMENTATION CODES:   Not applicable  INTERVENTION:  -Ensure Enlive po BID, each supplement provides 350 kcal and 20 grams of protein  -1 packet Juven BID, each packet provides 95 calories, 2.5 grams of protein (collagen), and 9.8 grams of carbohydrate (3 grams sugar); also contains 7 grams of L-arginine and L-glutamine, 300 mg vitamin C, 15 mg vitamin E, 1.2 mcg vitamin B-12, 9.5 mg zinc, 200 mg calcium, and 1.5 g  Calcium Beta-hydroxy-Beta-methylbutyrate to support wound healing  -Recommend regular diet should patient have poor po intake of meals.   NUTRITION DIAGNOSIS:   Increased nutrient needs related to acute illness(acute respiratory failure with hypoxia secondary to Covid-19 infection) as evidenced by estimated needs.  GOAL:   Patient will meet greater than or equal to 90% of their needs  MONITOR:   PO intake, Supplement acceptance, Weight trends, I & O's, Labs  REASON FOR ASSESSMENT:   Malnutrition Screening Tool    ASSESSMENT:  RD working remotely.  73 year old female with past medical history of Parkinson's disease, prior CVA with residual left-sided hemiparesis, depression, anxiety, CAD, T2DM, chronic pain who was transferred from [facility] for management of Covid pneumonia. Patient is nonverbal upon [facility] arrival secondary to receiving Haldol prior to transfer. Pt resides at [facility] and sent to hospital after developing SOB, and fever. Patient with previous hospitalization 6/1-6/5 for similar symptomatology and treated with remdesvir and steroids, pt weaned down to 2 L and discharged home.  June 2020 - Covid 19 dx 12/10 - Covid 19 dx  Treatment course: 12/10 -  Remdesivir; Steroids  Patient newly admitted, limited notes for review and no recorded meals at this time. Will continue to monitor for po intake of meals and supplements. Patient provided Ensure daily as well as Magic Cup on lunch and dinner trays. Will increase Ensure to  twice daily to aid with increased calorie/protein needs as well as Juven to support wound healing of stage 1 sacral pressure injury.   Patient noted with T2DM, on carb modified diet at this time. Would expect to see elevated BG given steroid treatments. Recommend regular diet should patient experience poor po intake.   Current wt 61.4 kg (135.1 lb) Limited wt history available for review, on 6/5 pt wt 64.1 kg (141 lbs) Patient with insignificant 4.2% wt loss in 6 months.   Medications reviewed and include: Sinemet TID, Plavix 75 mg daily, Dexamethasone 6 mg every 24 hrs, Colace 100 mg daily, Antivert 12.5 mg daily, Protonix 40 mg daily, Miralax daily, Mirapex 0.5 mg BID, Vit C 500 mg daily, Zinc sulfate 220 mg daily, Cefepime 2 g IVPB every 12 hrs, Remdesivir 100 mg IVPB daily  Labs: CBGs 116-153 x 2 this admission, CRP 25.1 (H), WBC 12.5 (H), Hgb 10.8 (L)  NUTRITION - FOCUSED PHYSICAL EXAM: Unable to complete at this time, RD working remotely.  Diet Order:   Diet Order            Diet Carb Modified Fluid consistency: Thin; Room service appropriate? Yes  Diet effective now              EDUCATION NEEDS:   No education needs have been identified at this time  Skin:  Skin Assessment: Reviewed RN Assessment(stage 1; sacrum)  Last BM:  Unknown  Height:   Ht Readings from Last 1 Encounters:  01/22/19 5\' 4"  (1.626 m)    Weight:   Wt Readings from Last 1 Encounters:  01/22/19 61.4 kg  Ideal Body Weight:  54.5 kg  BMI:  Body mass index is 23.23 kg/m.  Estimated Nutritional Needs:   Kcal:  6122-4497  Protein:  86-92  Fluid:  >/= 1.6 L/day   Lajuan Lines, RD, LDN Clinical Nutrition [place] Telephone [phone] After Hours/Weekend Pager: [phone]

## 2019-01-22 NOTE — Progress Notes (Signed)
Pharmacy Antibiotic Note  Jody Young is a 73 y.o. female admitted on 01/22/2019 with pneumonia.  Pharmacy has been consulted for Vancomycin / Cefepime dosing.  Received Vancomycin 1500 mg iv x 1 at [facility]: Cefepime 2 grams iv Q 12 hours Vancomycin 1750 mg iv Q 48 hours Follow up Scr, cultures, progress  Height: 5\' 4"  (162.6 cm) Weight: 135 lb 5.8 oz (61.4 kg) IBW/kg (Calculated) : 54.7  Temp (24hrs), Avg:98 F (36.7 C), Min:97.5 F (36.4 C), Max:98.4 F (36.9 C)  Recent Labs  Lab 01/22/19 0717  WBC 12.5*  CREATININE 0.53    Estimated Creatinine Clearance: 52.5 mL/min (by C-G formula based on SCr of 0.53 mg/dL).    Allergies  Allergen Reactions  . Astemizole Other (See Comments)    Hallucinations Hallucinations   . Niacin Other (See Comments)    Listed on MAR    Thank you  Anette Guarneri, PharmD 01/22/2019 11:43 AM

## 2019-01-22 NOTE — Progress Notes (Signed)
Pt transferring from [facility] briefly with pharmacist at [facility] received: Remdesivir 200mg  12/10 2121 Cefepime 2gm IV 12/10 1819 Vancomycin 1500mg  IV 12/10 1846 Dexamethasone 6mg  IV 12/10 2122 Lovenox 40mg  SQ 12/10 2122  Labs 12/10: ALT 10 SCr 0.5  Plan: Will change remdesivir orders to 100mg  daily x 4 more doses (total of 5 day course)   Sherlon Handing, PharmD, BCPS Please see amion for complete clinical pharmacist phone list 01/22/2019 6:11 AM

## 2019-01-23 ENCOUNTER — Encounter (HOSPITAL_COMMUNITY): Payer: Self-pay | Admitting: Internal Medicine

## 2019-01-23 DIAGNOSIS — J1289 Other viral pneumonia: Secondary | ICD-10-CM

## 2019-01-23 DIAGNOSIS — U071 COVID-19: Secondary | ICD-10-CM

## 2019-01-23 DIAGNOSIS — F0280 Dementia in other diseases classified elsewhere without behavioral disturbance: Secondary | ICD-10-CM

## 2019-01-23 DIAGNOSIS — G20 Parkinson's disease: Secondary | ICD-10-CM

## 2019-01-23 DIAGNOSIS — I10 Essential (primary) hypertension: Secondary | ICD-10-CM

## 2019-01-23 DIAGNOSIS — E118 Type 2 diabetes mellitus with unspecified complications: Secondary | ICD-10-CM

## 2019-01-23 DIAGNOSIS — E1165 Type 2 diabetes mellitus with hyperglycemia: Secondary | ICD-10-CM

## 2019-01-23 LAB — GLUCOSE, CAPILLARY
Glucose-Capillary: 203 mg/dL — ABNORMAL HIGH (ref 70–99)
Glucose-Capillary: 225 mg/dL — ABNORMAL HIGH (ref 70–99)
Glucose-Capillary: 152 mg/dL — ABNORMAL HIGH (ref 70–99)

## 2019-01-23 LAB — CBC WITH DIFFERENTIAL/PLATELET
Abs Immature Granulocytes: 0.17 10*3/uL — ABNORMAL HIGH (ref 0.00–0.07)
Basophils Absolute: 0 10*3/uL (ref 0.0–0.1)
Basophils Relative: 0 %
Eosinophils Absolute: 0 10*3/uL (ref 0.0–0.5)
Eosinophils Relative: 0 %
HCT: 35.8 % — ABNORMAL LOW (ref 36.0–46.0)
Hemoglobin: 10.6 g/dL — ABNORMAL LOW (ref 12.0–15.0)
Immature Granulocytes: 2 %
Lymphocytes Relative: 10 %
Lymphs Abs: 1.1 10*3/uL (ref 0.7–4.0)
MCH: 28.5 pg (ref 26.0–34.0)
MCHC: 29.6 g/dL — ABNORMAL LOW (ref 30.0–36.0)
MCV: 96.2 fL (ref 80.0–100.0)
Monocytes Absolute: 0.5 10*3/uL (ref 0.1–1.0)
Monocytes Relative: 5 %
Neutro Abs: 9.1 10*3/uL — ABNORMAL HIGH (ref 1.7–7.7)
Neutrophils Relative %: 83 %
Platelets: 466 10*3/uL — ABNORMAL HIGH (ref 150–400)
RBC: 3.72 MIL/uL — ABNORMAL LOW (ref 3.87–5.11)
RDW: 12.6 % (ref 11.5–15.5)
WBC: 10.9 10*3/uL — ABNORMAL HIGH (ref 4.0–10.5)
nRBC: 0 % (ref 0.0–0.2)

## 2019-01-23 LAB — COMPREHENSIVE METABOLIC PANEL
ALT: 8 U/L (ref 0–44)
AST: 23 U/L (ref 15–41)
Albumin: 2.3 g/dL — ABNORMAL LOW (ref 3.5–5.0)
Alkaline Phosphatase: 90 U/L (ref 38–126)
Anion gap: 12 (ref 5–15)
BUN: 24 mg/dL — ABNORMAL HIGH (ref 8–23)
CO2: 37 mmol/L — ABNORMAL HIGH (ref 22–32)
Calcium: 9 mg/dL (ref 8.9–10.3)
Chloride: 94 mmol/L — ABNORMAL LOW (ref 98–111)
Creatinine, Ser: 0.56 mg/dL (ref 0.44–1.00)
GFR calc Af Amer: >60 mL/min (ref >60)
GFR calc non Af Amer: >60 mL/min (ref >60)
Glucose, Bld: 173 mg/dL — ABNORMAL HIGH (ref 70–99)
Potassium: 3.8 mmol/L (ref 3.5–5.1)
Sodium: 143 mmol/L (ref 135–145)
Total Bilirubin: 0.7 mg/dL (ref 0.3–1.2)
Total Protein: 7.1 g/dL (ref 6.5–8.1)

## 2019-01-23 LAB — D-DIMER, QUANTITATIVE: D-Dimer, Quant: 3.95 ug/mL-FEU — ABNORMAL HIGH (ref 0.00–0.50)

## 2019-01-23 LAB — C-REACTIVE PROTEIN: CRP: 17.6 mg/dL — ABNORMAL HIGH (ref <1.0)

## 2019-01-23 LAB — PROCALCITONIN: Procalcitonin: 0.18 ng/mL

## 2019-01-23 LAB — FERRITIN: Ferritin: 158 ng/mL (ref 11–307)

## 2019-01-23 MED ORDER — INSULIN ASPART 100 UNIT/ML ~~LOC~~ SOLN
0.0000 [IU] | Freq: Three times a day (TID) | SUBCUTANEOUS | Status: DC
  Administered 2019-01-23 (×2): 3 [IU] via SUBCUTANEOUS
  Administered 2019-01-23 – 2019-01-24 (×2): 1 [IU] via SUBCUTANEOUS
  Administered 2019-01-24: 2 [IU] via SUBCUTANEOUS
  Administered 2019-01-25: 3 [IU] via SUBCUTANEOUS
  Administered 2019-01-25: 2 [IU] via SUBCUTANEOUS
  Administered 2019-01-26: 09:00:00 1 [IU] via SUBCUTANEOUS
  Administered 2019-01-26: 13:00:00 3 [IU] via SUBCUTANEOUS

## 2019-01-23 MED ORDER — INSULIN ASPART 100 UNIT/ML ~~LOC~~ SOLN
0.0000 [IU] | Freq: Every day | SUBCUTANEOUS | Status: DC
  Administered 2019-01-24 – 2019-01-25 (×2): 2 [IU] via SUBCUTANEOUS

## 2019-01-23 NOTE — Progress Notes (Addendum)
PROGRESS NOTE  [name]  UJW:[id number] DOB: 1945/04/29 DOA: 01/22/2019 PCP: Patient, No Pcp Per   Brief Narrative: Jody Young is a 73 y.o. female with a history of Parkinson's disease, dementia, T2DM, CVA with residual left hemiparesis, HTN, and covid-19 infection who presented to [facility] ED 12/10 from SNF [facility]). The patient was treated for covid-19 pneumonia in June, discharged in improved condition with 2L O2 and had continued on 2L O2 since discharge. She was reportedly being treated with antibiotics recently for pneumonia but on the day of presentation, she was found to be hypoxic to 77% and febrile to 103F, so was placed on NRB and sent to ED. In ED 102.25F, HR 127bpm, RR 46/min, placed on 40% FiO2 through ventimask with SpO2 94%. WBC 15.5k, hgb 10.7 (baseline appears to be ~13), platelets 478. ABG on 60% FiO2 initially demonstrated pH 7.370, CO2 74, pO2 129, later redrawn on 40% with 7.410/66/76. SARS-CoV-2 PCR was positive. CXR read as hazy opacities identified in the right mid and lower lung with small right pleural effusion, clear left lung. Troponin 0.02. ALT 10, Cr 0.50. CRP >270, LDH and ferritin actually normal. UA negative for leuk esterase, nitrite, blood.  Goals of care discussion with the patient's husband by EDP due to patient reportedly shouting "I want to die," though goal remains curative intent with continued DNR. Patient required haldol due to agitation in ED, and was admitted at [facility] this morning.   Assessment & Plan: Principal Problem:   Acute respiratory failure with hypoxia (HCC) Active Problems:   Pneumonia due to COVID-19 virus   Dementia due to Parkinson's disease without behavioral disturbance (HCC)   Essential hypertension   Diabetes mellitus type 2, uncontrolled, with complications (HCC)   Pressure injury of skin  Acute hypoxic respiratory failure due to recurrent [id number] pneumonia complicated by RML and RLL HCAP: Was admitted and treated for  covid-19 with remdesivir and steroids 6/1 - 6/5, discharged on 2L O2 with prednisone taper to SNF. She has had serial testing at the facility, reportedly negative weekly including a couple days before admission, so this represents reinfection with superimposed bacterial component.  - CRP severely elevated, improving. Will continue repeat course of remdesivir (12/10 - 12/14). Follow ALT - Continue vancomycin (dosing q48h, next today) and cefepime, check MRSA PCR, and trend PCT (improving) Check SLP eval with AMS and CVA hx. - Continue supplemental oxygen as tolerated, currently 12L. Will consult palliative care as she is DNR on admission and at high risk of poor outcome. - Continue steroids. Monitor inflammatory markers. - Continue airborne, contact precautions. PPE including surgical gown, gloves, cap, shoe covers, and CAPR used during this encounter in a negative pressure room.  - Continue chemical DVT ppx. D-dimer elevated, will trend. - Maintain euvolemia/net negative.  - Avoid NSAIDs - Recommend proning and aggressive use of incentive spirometry. - Mobilize as much as possible, PT/OT ordered  Severe sepsis due to covid-19 pneumonia, and superimposed bacterial HCAP:  - Monitor cultures taken at [facility] - Abx as above - Tx covid as above.  - HyPERtensive at this time, will defer IVF with concomitant covid.  Acute metabolic encephalopathy: Ammonia 14. Note normal pH on ABG's despite hypercarbia. With elevated bicarb, this would be consistent with compensated respiratory acidosis. - Delirium precautions - Treat sepsis and covid as above.   CAD s/p stent x2:  - Antiplatelet + statin  History of CVA with residual left hemiparesis:  - Continue DAPT, statin  T2DM: HbA1c  7.1%.  - SSI ordered. - Holding invokana and metformin at this time.   Parkinson's Disease:  - Continue sinemet.   History of urinary retention: Monitor UOP  HTN:  - Continue nebivolol and norvasc as she is hypertensive.    History of tobacco abuse: Noted in EMR.   Anemia of chronic disease: Possibly.  - Monitor, stable.  Leukocytosis, thrombocytosis: More consistent with bacterial infection than uncomplicated covid.  - Monitor, relatively stable.  Anxiety:  - Klonopin and buspar continued at admission.  RN Pressure Injury Documentation: Pressure Injury 01/22/19 Sacrum Mid;Upper Stage I -  Intact skin with non-blanchable redness of a localized area usually over a bony prominence. stage 1 on sacrum  (Active)  01/22/19 0626  Location: Sacrum  Location Orientation: Mid;Upper  Staging: Stage I -  Intact skin with non-blanchable redness of a localized area usually over a bony prominence.  Wound Description (Comments): stage 1 on sacrum   Present on Admission: Yes   DVT prophylaxis: Lovenox Code Status: DNR. Gold form in chart was signed in 2018.  Family Communication: Husband by phone daily Disposition Plan: Uncertain  Consultants:   Palliative care  Procedures:   None  Antimicrobials:  Vancomycin, cefepime, remdesivir   Subjective: Much more alert, taking po, irritated by mask. Feels generally uncomfortable but denies shortness of breath or chest pain. She's cold.   Objective: Vitals:   01/23/19 0000 01/23/19 0100 01/23/19 0400 01/23/19 0754  BP: (!) 122/58  (!) 126/58 127/69  Pulse: 97 91 92 91  Resp: (!) 27 (!) 26 (!) 24 20  Temp: 98.3 F (36.8 C)  98.2 F (36.8 C) 97.6 F (36.4 C)  TempSrc: Oral  Oral Axillary  SpO2: 97% (!) 89%  100%  Weight:      Height:        Intake/Output Summary (Last 24 hours) at 01/23/2019 1102 Last data filed at 01/23/2019 0500 Gross per 24 hour  Intake 1250 ml  Output 900 ml  Net 350 ml   Filed Weights   01/22/19 0521  Weight: 61.4 kg   Gen: Chronically ill-appearing female in no distress Pulm: Nonlabored breathing w/mask, crackles in the right base more than left. No wheezes. CV: Regular borderline tachycardia. No murmur, rub, or gallop.  No JVD, no dependent edema. GI: Abdomen soft, non-tender, non-distended, with normoactive bowel sounds.  Ext: Warm, no deformities Skin: No new rashes, lesions or ulcers on visualized skin. Neuro: Alert, without new focal neurological deficits. Psych: Judgement and insight appear impaired. Mood euthymic & affect congruent. Behavior is appropriate.    Data Reviewed: I have personally reviewed following labs and imaging studies  CBC: Recent Labs  Lab 01/22/19 0717 01/23/19 0009  WBC 12.5* 10.9*  NEUTROABS 10.9* 9.1*  HGB 10.8* 10.6*  HCT 36.8 35.8*  MCV 95.8 96.2  PLT 391 466*   Basic Metabolic Panel: Recent Labs  Lab 01/22/19 0717 01/23/19 0009  NA 144 143  K 3.8 3.8  CL 93* 94*  CO2 36* 37*  GLUCOSE 150* 173*  BUN 13 24*  CREATININE 0.53 0.56  CALCIUM 9.0 9.0   GFR: Estimated Creatinine Clearance: 52.5 mL/min (by C-G formula based on SCr of 0.56 mg/dL). Liver Function Tests: Recent Labs  Lab 01/22/19 0717 01/23/19 0009  AST 15 23  ALT 19 8  ALKPHOS 111 90  BILITOT 0.6 0.7  PROT 7.7 7.1  ALBUMIN 2.3* 2.3*   Anemia Panel: Recent Labs    01/22/19 0717 01/23/19 0009  FERRITIN 154 158  Scheduled Meds: . amLODipine  10 mg Oral Daily  . aspirin  81 mg Oral Daily  . busPIRone  10 mg Oral BID  . carbidopa-levodopa  1 tablet Oral 3 times per day  . clopidogrel  75 mg Oral Daily  . dexamethasone  6 mg Oral Q24H  . docusate sodium  100 mg Oral Daily  . DULoxetine  20 mg Oral Daily  . enoxaparin (LOVENOX) injection  40 mg Subcutaneous QHS  . feeding supplement (ENSURE ENLIVE)  237 mL Oral BID BM  . insulin aspart  0-5 Units Subcutaneous QHS  . insulin aspart  0-9 Units Subcutaneous TID WC  . Ipratropium-Albuterol  1 puff Inhalation Q6H  . meclizine  12.5 mg Oral Daily  . nutrition supplement (JUVEN)  1 packet Oral BID BM  . pantoprazole  40 mg Oral Daily  . polyethylene glycol  17 g Oral Daily  . pramipexole  0.5 mg Oral BID  . pravastatin  20 mg Oral  q1800  . vitamin C  500 mg Oral Daily  . zinc sulfate  220 mg Oral Daily   Continuous Infusions: . ceFEPime (MAXIPIME) IV 2 g (01/23/19 0121)  . remdesivir 100 mg in NS 100 mL 100 mg (01/23/19 0927)  . vancomycin       LOS: 1 day   Time spent: 35 minutes.  Tyrone Nine, MD [facility] [email or web address] 01/23/2019, 11:02 AM

## 2019-01-23 NOTE — Evaluation (Signed)
Clinical/Bedside Swallow Evaluation Patient Details  Name: Jody Young MRN: [id number] Date of Birth: [birth year]  Today's Date: 01/23/2019 Time: SLP Start Time (ACUTE ONLY): 2 SLP Stop Time (ACUTE ONLY): 1258 SLP Time Calculation (min) (ACUTE ONLY): 22 min  Past Medical History:  Past Medical History:  Diagnosis Date  . Chronic pain   . Coronary artery disease   . CVA (cerebral vascular accident) ([place])   . Depression   . Parkinson's disease [facility])    Past Surgical History: History reviewed. No pertinent surgical history. HPI:  73 y.o. female with a history of Parkinson's disease, dementia, T2DM, CVA with residual left hemiparesis, HTN, and covid-19 infection who presented to [facility] ED 12/10 from SNF [facility]). The patient was treated for covid-19 pneumonia in June, discharged in improved condition with 2L O2 and had continued on 2L O2 since discharge. Followed by SLP services briefly during June admission.  Assessment / Plan / Recommendation Clinical Impression  Pt participated in clinical swallow assessment - she was feeding herself lunch upon entering room.  Presents with flat affect, hypomimia.  Ventimask around chin but pt saturating at 92%.  Able to successfully eat chicken and solids from tray with adequate mastication, brisk swallow response, and no s/s of aspiration with solids nor liquids.  No dysphagia identified.  Recommend continuing regular solids, thin liquids; meds whole in applesauce.  No SLP f/u needed.  SLP Visit Diagnosis: Dysphagia, unspecified (R13.10)    Aspiration Risk  No limitations    Diet Recommendation     Medication Administration: Whole meds with puree    Other  Recommendations Oral Care Recommendations: Oral care BID   Follow up Recommendations None      Frequency and Duration            Prognosis        Swallow Study   General Date of Onset: 01/22/19 HPI: 73 y.o. female with a history of Parkinson's disease, dementia, T2DM,  CVA with residual left hemiparesis, HTN, and covid-19 infection who presented to [facility] ED 12/10 from SNF [facility]). The patient was treated for covid-19 pneumonia in June, discharged in improved condition with 2L O2 and had continued on 2L O2 since discharge. Type of Study: Bedside Swallow Evaluation Previous Swallow Assessment: see HPI Diet Prior to this Study: Regular;Thin liquids Temperature Spikes Noted: No Respiratory Status: Venti-mask History of Recent Intubation: No Behavior/Cognition: Alert Oral Cavity Assessment: Within Functional Limits Oral Cavity - Dentition: Adequate natural dentition Vision: Functional for self-feeding Self-Feeding Abilities: Able to feed self Patient Positioning: Upright in bed Baseline Vocal Quality: Normal Volitional Cough: Strong Volitional Swallow: Able to elicit    Oral/Motor/Sensory Function Overall Oral Motor/Sensory Function: Other (comment)(hypomimia)   Ice Chips Ice chips: Within functional limits   Thin Liquid Thin Liquid: Within functional limits    Nectar Thick Nectar Thick Liquid: Not tested   Honey Thick Honey Thick Liquid: Not tested   Puree Puree: Within functional limits   Solid     Solid: Within functional limits      Juan Quam Laurice 01/23/2019,12:58 PM  Estill Bamberg L. Tivis Ringer, [place] Office number [phone] Pager [phone]

## 2019-01-23 NOTE — Plan of Care (Signed)

## 2019-01-24 LAB — COMPREHENSIVE METABOLIC PANEL WITH GFR
ALT: 8 U/L (ref 0–44)
AST: 23 U/L (ref 15–41)
Albumin: 2.6 g/dL — ABNORMAL LOW (ref 3.5–5.0)
Alkaline Phosphatase: 86 U/L (ref 38–126)
Anion gap: 15 (ref 5–15)
BUN: 23 mg/dL (ref 8–23)
CO2: 31 mmol/L (ref 22–32)
Calcium: 8.9 mg/dL (ref 8.9–10.3)
Chloride: 90 mmol/L — ABNORMAL LOW (ref 98–111)
Creatinine, Ser: 0.41 mg/dL — ABNORMAL LOW (ref 0.44–1.00)
GFR calc Af Amer: >60 mL/min (ref >60)
GFR calc non Af Amer: >60 mL/min (ref >60)
Glucose, Bld: 119 mg/dL — ABNORMAL HIGH (ref 70–99)
Potassium: 4.3 mmol/L (ref 3.5–5.1)
Sodium: 136 mmol/L (ref 135–145)
Total Bilirubin: 0.5 mg/dL (ref 0.3–1.2)
Total Protein: 7.1 g/dL (ref 6.5–8.1)

## 2019-01-24 LAB — CBC WITH DIFFERENTIAL/PLATELET
Abs Immature Granulocytes: 0.13 10*3/uL — ABNORMAL HIGH (ref 0.00–0.07)
Basophils Absolute: 0.1 10*3/uL (ref 0.0–0.1)
Basophils Relative: 1 %
Eosinophils Absolute: 0.1 10*3/uL (ref 0.0–0.5)
Eosinophils Relative: 1 %
HCT: 39 % (ref 36.0–46.0)
Hemoglobin: 11.6 g/dL — ABNORMAL LOW (ref 12.0–15.0)
Immature Granulocytes: 1 %
Lymphocytes Relative: 15 %
Lymphs Abs: 1.7 10*3/uL (ref 0.7–4.0)
MCH: 28.4 pg (ref 26.0–34.0)
MCHC: 29.7 g/dL — ABNORMAL LOW (ref 30.0–36.0)
MCV: 95.6 fL (ref 80.0–100.0)
Monocytes Absolute: 0.7 10*3/uL (ref 0.1–1.0)
Monocytes Relative: 6 %
Neutro Abs: 9 10*3/uL — ABNORMAL HIGH (ref 1.7–7.7)
Neutrophils Relative %: 76 %
Platelets: 372 10*3/uL (ref 150–400)
RBC: 4.08 MIL/uL (ref 3.87–5.11)
RDW: 12.6 % (ref 11.5–15.5)
WBC: 11.7 10*3/uL — ABNORMAL HIGH (ref 4.0–10.5)
nRBC: 0 % (ref 0.0–0.2)

## 2019-01-24 LAB — GLUCOSE, CAPILLARY
Glucose-Capillary: 132 mg/dL — ABNORMAL HIGH (ref 70–99)
Glucose-Capillary: 101 mg/dL — ABNORMAL HIGH (ref 70–99)
Glucose-Capillary: 181 mg/dL — ABNORMAL HIGH (ref 70–99)
Glucose-Capillary: 221 mg/dL — ABNORMAL HIGH (ref 70–99)

## 2019-01-24 LAB — D-DIMER, QUANTITATIVE: D-Dimer, Quant: 2.11 ug{FEU}/mL — ABNORMAL HIGH (ref 0.00–0.50)

## 2019-01-24 LAB — C-REACTIVE PROTEIN: CRP: 7.5 mg/dL — ABNORMAL HIGH (ref <1.0)

## 2019-01-24 LAB — PROCALCITONIN: Procalcitonin: <0.1 ng/mL

## 2019-01-24 LAB — FERRITIN: Ferritin: 185 ng/mL (ref 11–307)

## 2019-01-24 MED ORDER — LORAZEPAM 2 MG/ML IJ SOLN
0.5000 mg | Freq: Once | INTRAMUSCULAR | Status: AC
  Administered 2019-01-24: 0.5 mg via INTRAVENOUS
  Filled 2019-01-24: qty 1

## 2019-01-24 MED ORDER — CLONAZEPAM 0.5 MG PO TBDP
1.0000 mg | ORAL_TABLET | Freq: Two times a day (BID) | ORAL | Status: DC | PRN
  Administered 2019-01-24: 1 mg via ORAL
  Filled 2019-01-24 (×3): qty 2

## 2019-01-24 NOTE — Progress Notes (Signed)
Pt husband updated this afternoon-all questions & concerns addressed. Also, set it up so pt was able to speak with her husband over speaker phone. PRN clonazepam 1mg  given this afternoon w/ tyelenol for anxiety & headache that pt c/o. Unfortunately, pt is eating only a bite or 2 of each meal, even when explained she really needs to eat so she can feel better. Will keep trying & w/ a variety of food. Not drinking much of ordered supplements either. Pt given full bath today. Pt worked w/ OT  **Pt is now on RA! (94-100%)

## 2019-01-24 NOTE — Progress Notes (Signed)
PROGRESS NOTE  Jody Young  WEX:[id number] DOB: 04-01-1945 DOA: 01/22/2019 PCP: Patient, No Pcp Per   Brief Narrative: Jody Young is a 73 y.o. female with a history of Parkinson's disease, dementia, T2DM, CVA with residual left hemiparesis, HTN, and covid-19 infection who presented to [facility] ED 12/10 from SNF [facility]). The patient was treated for covid-19 pneumonia in June, discharged in improved condition with 2L O2 and had continued on 2L O2 since discharge. She was reportedly being treated with antibiotics recently for pneumonia but on the day of presentation, she was found to be hypoxic to 77% and febrile to 103F, so was placed on NRB and sent to ED. In ED 102.23F, HR 127bpm, RR 46/min, placed on 40% FiO2 through ventimask with SpO2 94%. WBC 15.5k, hgb 10.7 (baseline appears to be ~13), platelets 478. ABG on 60% FiO2 initially demonstrated pH 7.370, CO2 74, pO2 129, later redrawn on 40% with 7.410/66/76. SARS-CoV-2 PCR was positive. CXR read as hazy opacities identified in the right mid and lower lung with small right pleural effusion, clear left lung. Troponin 0.02. ALT 10, Cr 0.50. CRP >270, LDH and ferritin actually normal. UA negative for leuk esterase, nitrite, blood.  Goals of care discussion with the patient's husband by EDP due to patient reportedly shouting "I want to die," though goal remains curative intent with continued DNR. Patient required haldol due to agitation in ED, and was admitted at [facility] this morning.   Assessment & Plan: Principal Problem:   Acute respiratory failure with hypoxia (HCC) Active Problems:   Pneumonia due to COVID-19 virus   Dementia due to Parkinson's disease without behavioral disturbance (HCC)   Essential hypertension   Diabetes mellitus type 2, uncontrolled, with complications (HCC)   Pressure injury of skin  Acute hypoxic respiratory failure due to recurrent LFYBO-17 pneumonia complicated by RML and RLL HCAP: Was admitted and treated for  covid-19 with remdesivir and steroids 6/1 - 6/5, discharged on 2L O2 with prednisone taper to SNF. She has had serial testing at the facility, reportedly negative weekly including a couple days before admission, so this represents reinfection with superimposed bacterial component.  - CRP severely elevated, but showing significant improvement with treatment. Will continue repeat course of remdesivir (12/10 - 12/14). Follow ALT. - Continue vancomycin and cefepime, check MRSA PCR, and trend PCT (improving, pending today). Pt's husband reports she was on mechanical soft diet, will change to that. SLP saw no evidence of aspiration. - Continue supplemental oxygen as tolerated, currently 12L. Will consult palliative care as she is DNR on admission and at high risk of poor outcome. - Continue steroids. Monitor inflammatory markers. - Continue airborne, contact precautions. PPE including surgical gown, gloves, cap, shoe covers, and CAPR used during this encounter in a negative pressure room.  - Continue chemical DVT ppx. D-dimer elevated, will trend. - Maintain euvolemia/net negative.  - Avoid NSAIDs - Recommend proning and aggressive use of incentive spirometry. - Mobilize as much as possible, PT/OT ordered  Severe sepsis due to covid-19 pneumonia, and superimposed bacterial HCAP: Improving. - Monitor cultures taken at [facility] - Abx as above - Tx covid as above.   Acute metabolic encephalopathy: Ammonia 14. Note normal pH on ABG's despite hypercarbia. With elevated bicarb, this would be consistent with compensated respiratory acidosis. - Delirium precautions - Treat sepsis and covid as above.  - Risk mitigation w/frequent redirection, treating pain and toileting.  CAD s/p stent x2:  - Antiplatelet + statin  History of CVA  with residual left hemiparesis:  - Continue DAPT, statin  T2DM: HbA1c 7.1%.  - SSI ordered. - Holding invokana and metformin at this time.   Stage IIIa CKD: Based on available  lab values. - Avoid nephrotoxins, DC vancomycin ASAP  Parkinson's Disease:  - Continue sinemet.   History of urinary retention: Monitor UOP  HTN:  - Continue nebivolol and norvasc, improving control History of tobacco abuse: Noted in EMR.   Anemia of chronic disease: Possibly.  - Monitor, stable.  Leukocytosis, thrombocytosis: More consistent with bacterial infection than uncomplicated covid.  - Monitor, relatively stable. Pending this AM.  Anxiety:  - Klonopin and buspar continued at admission. With uncontrolled symptoms, will increase clonazepam for now. Give one dose of IV ativan low dose this morning given severe restlessness, high probability of getting out of bed and falling. Bladder scan due to restlessness  RN Pressure Injury Documentation: Pressure Injury 01/22/19 Sacrum Mid;Upper Stage I -  Intact skin with non-blanchable redness of a localized area usually over a bony prominence. stage 1 on sacrum  (Active)  01/22/19 0626  Location: Sacrum  Location Orientation: Mid;Upper  Staging: Stage I -  Intact skin with non-blanchable redness of a localized area usually over a bony prominence.  Wound Description (Comments): stage 1 on sacrum   Present on Admission: Yes   DVT prophylaxis: Lovenox Code Status: DNR. Gold form in chart was signed in 2018.  Family Communication: Husband by phone daily Disposition Plan: Uncertain  Consultants:   Palliative care  Procedures:   None  Antimicrobials:  Vancomycin, cefepime, remdesivir   Subjective: Feels she's breathing better and wants to go home. Restless and poorly redirectable, wants to get out of bed constantly. Denies any shortness of breath or pain anywhere.   Objective: Vitals:   01/23/19 2200 01/24/19 0000 01/24/19 0200 01/24/19 0400  BP: 129/67 124/64 (!) 142/76 137/75  Pulse: 96 88 99 99  Resp: 20 (!) 22 (!) 28 (!) 22  Temp:      TempSrc:      SpO2: 96% 98% 96% 100%  Weight:      Height:         Intake/Output Summary (Last 24 hours) at 01/24/2019 1005 Last data filed at 01/23/2019 1806 Gross per 24 hour  Intake --  Output 900 ml  Net -900 ml   Filed Weights   01/22/19 0521  Weight: 61.4 kg   Gen: 73 y.o. female in no distress, chronically ill-appearing Pulm: Nonlabored breathing supplemental oxygen, coarse breath sounds most at right base. No wheezes..Increased rate. CV: Regular rate and rhythm. No murmur, rub, or gallop. No JVD, no dependent edema. GI: Abdomen soft, non-tender, non-distended, with normoactive bowel sounds.  Ext: Warm, no deformities Skin: No new rashes, lesions or ulcers on visualized skin. Neuro: Alert, disoriented, decreased short term recall, no inattention. Moving all extremities. Psych: Judgement and insight appear impaired. Mood anxious & affect congruent. Restless.  Data Reviewed: I have personally reviewed following labs and imaging studies  CBC: Recent Labs  Lab 01/22/19 0717 01/23/19 0009  WBC 12.5* 10.9*  NEUTROABS 10.9* 9.1*  HGB 10.8* 10.6*  HCT 36.8 35.8*  MCV 95.8 96.2  PLT 391 466*   Basic Metabolic Panel: Recent Labs  Lab 01/22/19 0717 01/23/19 0009  NA 144 143  K 3.8 3.8  CL 93* 94*  CO2 36* 37*  GLUCOSE 150* 173*  BUN 13 24*  CREATININE 0.53 0.56  CALCIUM 9.0 9.0   GFR: Estimated Creatinine Clearance: 52.5 mL/min (  by C-G formula based on SCr of 0.56 mg/dL). Liver Function Tests: Recent Labs  Lab 01/22/19 0717 01/23/19 0009  AST 15 23  ALT 19 8  ALKPHOS 111 90  BILITOT 0.6 0.7  PROT 7.7 7.1  ALBUMIN 2.3* 2.3*   Anemia Panel: Recent Labs    01/23/19 0009 01/24/19 0521  FERRITIN 158 185   Scheduled Meds: . amLODipine  10 mg Oral Daily  . aspirin  81 mg Oral Daily  . busPIRone  10 mg Oral BID  . carbidopa-levodopa  1 tablet Oral 3 times per day  . clopidogrel  75 mg Oral Daily  . dexamethasone  6 mg Oral Q24H  . docusate sodium  100 mg Oral Daily  . DULoxetine  20 mg Oral Daily  . enoxaparin  (LOVENOX) injection  40 mg Subcutaneous QHS  . feeding supplement (ENSURE ENLIVE)  237 mL Oral BID BM  . insulin aspart  0-5 Units Subcutaneous QHS  . insulin aspart  0-9 Units Subcutaneous TID WC  . Ipratropium-Albuterol  1 puff Inhalation Q6H  . meclizine  12.5 mg Oral Daily  . nutrition supplement (JUVEN)  1 packet Oral BID BM  . pantoprazole  40 mg Oral Daily  . polyethylene glycol  17 g Oral Daily  . pramipexole  0.5 mg Oral BID  . pravastatin  20 mg Oral q1800  . vitamin C  500 mg Oral Daily  . zinc sulfate  220 mg Oral Daily   Continuous Infusions: . ceFEPime (MAXIPIME) IV 2 g (01/24/19 0117)  . remdesivir 100 mg in NS 100 mL 100 mg (01/23/19 0927)  . vancomycin 1,750 mg (01/23/19 1740)     LOS: 2 days   Time spent: 35 minutes.  Tyrone Nineyan B Junelle Hashemi, MD [facility] [email or web address] 01/24/2019, 10:05 AM

## 2019-01-24 NOTE — Evaluation (Signed)
Occupational Therapy Evaluation Patient Details Name: Jody Young MRN: [id number] DOB: 07-05-1945 Today's Date: 01/24/2019    History of Present Illness Jody Young is a 73 y.o. female from SNF admitted for hypoxia secondary to COVID-19. PMH includes Parkinson's disease, multiple CVAs with residual left-sided hemiparesis, dysarthria, depression, MI x2, HTN, CAD, DM II, neuropathy, dyslipidemia, nephrolithiasis, chronic pain, GERD and covid-19 infection in June 2020 who presented to [facility] ED 12/10.   Clinical Impression   Pt reports that she lives at skilled facility, uses a wheelchair to get around (unsure if it is power or self-propelled, or if she requires being pushed) She gets assist for all ADL. Today Pt eager to sit EOB and for ROM to all 4 extremities. Pt able to minimally assist with bed mobility to come sit EOB (max A), and able to progress from max A to sit EOB to min guard assist. Pt very pleasant to talk to, minor confusion but talked about her 3 children. Pt total A to return supine, max A for bed re-positioning. WIll benefit from skilled OT acutely to maximize safety and independence in ADL and functional transfers - next time take lift (that's how they do it at my facility). No Post-acute OT planned at this time if she is able to return to the facility that knows her and cares for her.     Follow Up Recommendations  SNF(return to skilled facility)    Equipment Recommendations  None recommended by OT(defer to next venue)    Recommendations for Other Services       Precautions / Restrictions Precautions Precautions: Fall Required Braces or Orthoses: Other Brace Other Brace: uses braces at baseline Bil feet and LUE for contracture prevention Restrictions Weight Bearing Restrictions: No      Mobility Bed Mobility Overal bed mobility: Needs Assistance Bed Mobility: Supine to Sit;Sit to Supine     Supine to sit: Max assist;HOB elevated Sit to supine: Total assist    General bed mobility comments: Pt grabs onto hand to assist with trunk elevation, but max A for BLE off bed and use of bed pad to position for hips. Total A helicopter technique for return to bed  Transfers                 General transfer comment: deferred    Balance Overall balance assessment: Needs assistance Sitting-balance support: Single extremity supported;Feet unsupported Sitting balance-Leahy Scale: Poor Sitting balance - Comments: able to progress from max A to min guard assist sitting EOB Postural control: Posterior lean                                 ADL either performed or assessed with clinical judgement   ADL Overall ADL's : Needs assistance/impaired Eating/Feeding: Minimal assistance;Sitting;Bed level   Grooming: Moderate assistance;Bed level   Upper Body Bathing: Maximal assistance;Bed level   Lower Body Bathing: Bed level;Total assistance   Upper Body Dressing : Maximal assistance;Sitting   Lower Body Dressing: Total assistance;Bed level       Toileting- Clothing Manipulation and Hygiene: Total assistance;Bed level Toileting - Clothing Manipulation Details (indicate cue type and reason): worked with RN to place purewick     Functional mobility during ADLs: (deferred)       Vision         Perception     Praxis      Pertinent Vitals/Pain Pain Assessment: Faces Faces Pain Scale: Hurts  a little bit Pain Location: limbs with stretching/ROM Pain Descriptors / Indicators: Discomfort Pain Intervention(s): Monitored during session;Repositioned;Patient requesting pain meds-RN notified(tylenol)     Hand Dominance Right   Extremity/Trunk Assessment Upper Extremity Assessment Upper Extremity Assessment: LUE deficits/detail LUE Deficits / Details: at baseline it is contracted - performed simple ROM at elbow, wrist, fingers as able LUE Sensation: WNL LUE Coordination: decreased fine motor;decreased gross motor   Lower  Extremity Assessment Lower Extremity Assessment: Defer to PT evaluation;LLE deficits/detail LLE Deficits / Details: knee contracture, ankles stiff LLE Coordination: decreased fine motor;decreased gross motor   Cervical / Trunk Assessment Cervical / Trunk Assessment: Other exceptions Cervical / Trunk Exceptions: rounded shoulders forward head   Communication Communication Communication: No difficulties   Cognition Arousal/Alertness: Awake/alert Behavior During Therapy: Restless;WFL for tasks assessed/performed Overall Cognitive Status: History of cognitive impairments - at baseline                                 General Comments: baseline dementia, confusion, following one step commands, making reasonable requests and recalling some of how she was able to function "this stroke messed things up"   General Comments       Exercises Exercises: Other exercises Other Exercises Other Exercises: ROM of LLE, RLE at hip, knee ankle x10 prior to sitting EOB Other Exercises: ROM at elbow, wrist, fingers of LUE    Shoulder Instructions      Home Living Family/patient expects to be discharged to:: Other (Comment)                                 Additional Comments: rest home/long-term care, [facility] in [place]      Prior Functioning/Environment Level of Independence: Needs assistance  Gait / Transfers Assistance Needed: "they lift me to my wheelchair" ADL's / Homemaking Assistance Needed: assist for bathing and dressing   Comments: all per patient who is unrealiable historian        OT Problem List: Decreased activity tolerance;Decreased range of motion;Impaired balance (sitting and/or standing);Cardiopulmonary status limiting activity      OT Treatment/Interventions: Therapeutic exercise;Manual therapy;DME and/or AE instruction;Therapeutic activities;Patient/family education;Balance training    OT Goals(Current goals can be found in the  care plan section) Acute Rehab OT Goals Patient Stated Goal: to sit up OT Goal Formulation: With patient Time For Goal Achievement: 02/07/19 Potential to Achieve Goals: Good ADL Goals Pt Will Perform Eating: with min assist;sitting Pt Will Perform Grooming: with min assist;sitting Pt Will Perform Upper Body Bathing: with min assist;sitting  OT Frequency: Min 1X/week   Barriers to D/C:            Co-evaluation              AM-PAC OT "6 Clicks" Daily Activity     Outcome Measure Help from another person eating meals?: A Lot Help from another person taking care of personal grooming?: A Lot Help from another person toileting, which includes using toliet, bedpan, or urinal?: A Lot Help from another person bathing (including washing, rinsing, drying)?: A Lot Help from another person to put on and taking off regular upper body clothing?: A Lot Help from another person to put on and taking off regular lower body clothing?: A Lot 6 Click Score: 12   End of Session Equipment Utilized During Treatment: Oxygen Nurse Communication: Mobility status;Patient requests pain meds;Precautions  Activity Tolerance: Patient tolerated treatment well Patient left: in bed;with call bell/phone within reach;with bed alarm set;with nursing/sitter in room  OT Visit Diagnosis: Other abnormalities of gait and mobility (R26.89);Muscle weakness (generalized) (M62.81)                Time: 5277-8242 OT Time Calculation (min): 30 min Charges:  OT General Charges $OT Visit: 1 Visit OT Evaluation $OT Eval Moderate Complexity: 1 Mod OT Treatments $Self Care/Home Management : 8-22 mins  Sherryl Manges OTR/L Acute Rehabilitation Services Pager: [phone] Office: [phone]  Evern Bio Cherissa Hook 01/24/2019, 2:58 PM

## 2019-01-25 ENCOUNTER — Other Ambulatory Visit: Payer: Self-pay

## 2019-01-25 LAB — CBC WITH DIFFERENTIAL/PLATELET
Abs Immature Granulocytes: 0.17 10*3/uL — ABNORMAL HIGH (ref 0.00–0.07)
Basophils Absolute: 0.1 10*3/uL (ref 0.0–0.1)
Basophils Relative: 0 %
Eosinophils Absolute: 0 10*3/uL (ref 0.0–0.5)
Eosinophils Relative: 0 %
HCT: 39.8 % (ref 36.0–46.0)
Hemoglobin: 12.1 g/dL (ref 12.0–15.0)
Immature Granulocytes: 1 %
Lymphocytes Relative: 12 %
Lymphs Abs: 1.4 10*3/uL (ref 0.7–4.0)
MCH: 28.4 pg (ref 26.0–34.0)
MCHC: 30.4 g/dL (ref 30.0–36.0)
MCV: 93.4 fL (ref 80.0–100.0)
Monocytes Absolute: 0.4 10*3/uL (ref 0.1–1.0)
Monocytes Relative: 3 %
Neutro Abs: 10.1 10*3/uL — ABNORMAL HIGH (ref 1.7–7.7)
Neutrophils Relative %: 84 %
Platelets: 458 10*3/uL — ABNORMAL HIGH (ref 150–400)
RBC: 4.26 MIL/uL (ref 3.87–5.11)
RDW: 12.4 % (ref 11.5–15.5)
WBC: 12.2 10*3/uL — ABNORMAL HIGH (ref 4.0–10.5)
nRBC: 0 % (ref 0.0–0.2)

## 2019-01-25 LAB — COMPREHENSIVE METABOLIC PANEL
ALT: 10 U/L (ref 0–44)
AST: 19 U/L (ref 15–41)
Albumin: 2.7 g/dL — ABNORMAL LOW (ref 3.5–5.0)
Alkaline Phosphatase: 78 U/L (ref 38–126)
Anion gap: 13 (ref 5–15)
BUN: 19 mg/dL (ref 8–23)
CO2: 29 mmol/L (ref 22–32)
Calcium: 8.6 mg/dL — ABNORMAL LOW (ref 8.9–10.3)
Chloride: 93 mmol/L — ABNORMAL LOW (ref 98–111)
Creatinine, Ser: 0.52 mg/dL (ref 0.44–1.00)
GFR calc Af Amer: >60 mL/min (ref >60)
GFR calc non Af Amer: >60 mL/min (ref >60)
Glucose, Bld: 86 mg/dL (ref 70–99)
Potassium: 4.6 mmol/L (ref 3.5–5.1)
Sodium: 135 mmol/L (ref 135–145)
Total Bilirubin: 0.5 mg/dL (ref 0.3–1.2)
Total Protein: 7.4 g/dL (ref 6.5–8.1)

## 2019-01-25 LAB — GLUCOSE, CAPILLARY
Glucose-Capillary: 145 mg/dL — ABNORMAL HIGH (ref 70–99)
Glucose-Capillary: 112 mg/dL — ABNORMAL HIGH (ref 70–99)
Glucose-Capillary: 120 mg/dL — ABNORMAL HIGH (ref 70–99)
Glucose-Capillary: 169 mg/dL — ABNORMAL HIGH (ref 70–99)
Glucose-Capillary: 243 mg/dL — ABNORMAL HIGH (ref 70–99)

## 2019-01-25 LAB — C-REACTIVE PROTEIN: CRP: 5.3 mg/dL — ABNORMAL HIGH (ref <1.0)

## 2019-01-25 LAB — D-DIMER, QUANTITATIVE: D-Dimer, Quant: 1.22 ug/mL-FEU — ABNORMAL HIGH (ref 0.00–0.50)

## 2019-01-25 LAB — FERRITIN: Ferritin: 195 ng/mL (ref 11–307)

## 2019-01-25 MED ORDER — SODIUM CHLORIDE 0.9 % IV SOLN
2.0000 g | Freq: Two times a day (BID) | INTRAVENOUS | Status: DC
  Administered 2019-01-25 – 2019-01-26 (×2): 2 g via INTRAVENOUS
  Filled 2019-01-25 (×2): qty 2

## 2019-01-25 MED ORDER — SODIUM CHLORIDE 0.9 % IV SOLN: 2.0000 g | Freq: Three times a day (TID) | INTRAVENOUS | Status: DC

## 2019-01-25 NOTE — Progress Notes (Signed)
Pharmacy Antibiotic Note  AVIKA CARBINE is a 73 y.o. female admitted on 01/22/2019 with pneumonia.  Pharmacy has been consulted for Vancomycin / Cefepime dosing.  Received Vancomycin 1500 mg iv x 1 at [facility]  Scr 0.52, CrCl ~52.5  Plan: Increase to Cefepime 2 grams iv Q 8 hours Vancomycin 1750 mg iv Q 48 hours Follow up Scr, cultures, progress, MRSA PCR  Height: 5\' 4"  (162.6 cm) Weight: 135 lb 5.8 oz (61.4 kg) IBW/kg (Calculated) : 54.7  Temp (24hrs), Avg:98.5 F (36.9 C), Min:97.5 F (36.4 C), Max:99.1 F (37.3 C)  Recent Labs  Lab 01/22/19 0717 01/23/19 0009 01/24/19 0521 01/25/19 0249  WBC 12.5* 10.9* 11.7* 12.2*  CREATININE 0.53 0.56 0.41* 0.52    Estimated Creatinine Clearance: 52.5 mL/min (by C-G formula based on SCr of 0.52 mg/dL).    Allergies  Allergen Reactions  . Astemizole Other (See Comments)    Hallucinations Hallucinations   . Niacin Other (See Comments)    Listed on MAR    Madalee Altmann A. Levada Dy, PharmD, BCPS, FNKF Clinical Pharmacist [facility] Please utilize Amion for appropriate phone number to reach the unit pharmacist ([place])   01/25/2019 2:35 PM

## 2019-01-25 NOTE — Patient Outreach (Signed)
Red Emmi:  Date of automated call:01/23/2019    Alert: no discharge papers  Placed call to patient, husband answered and reports patient is a resident at [facility].  Husband reports that patient admitted to [facility] with [place].    PLAN:remove from automated call. Closed case as patient is admitted.  Tomasa Rand, RN, BSN, CEN [facility] [employer] [phone]

## 2019-01-25 NOTE — Progress Notes (Signed)
d/c to SNF potentially tomorrow?  Worked w/ OT.  **Eating better today but more lethargic. Husband (steve) updated this afternoon & all questions/concerns were addressed. Remdesivir completed today. Bath given, gown & linen change today. Purewick replaced. Sacral foam replaced. No prn anxiety meds needed today. Less restless/agitated but still confused.

## 2019-01-25 NOTE — Progress Notes (Signed)
Called and spoke to [place], patient's spouse and gave update.

## 2019-01-25 NOTE — TOC Initial Note (Addendum)
Transition of Care [facility]) - Initial/Assessment Note    Patient Details  Name: Jody Young MRN: [id number] Date of Birth: 26-Sep-1945  Transition of Care [facility]) CM/SW Contact:    Weston Anna, LCSW Phone Number: 01/25/2019, 3:48 PM  Clinical Narrative:                  Patient is a resident at [facility]- per facility they are able to accept patient back whenever she is medically stable. CSW will continue to follow up  Expected Discharge Plan: [place] Barriers to Discharge: Continued Medical Work up   Patient Goals and CMS Choice        Expected Discharge Plan and Services Expected Discharge Plan: [place]                                              Prior Living Arrangements/Services                       Activities of Daily Living Home Assistive Devices/Equipment: Oxygen    Permission Sought/Granted                  Emotional Assessment              Admission diagnosis:  Pneumonia due to COVID-19 virus [U07.1, J12.89] Patient Active Problem List   Diagnosis Date Noted  . Pressure injury of skin 01/22/2019  . Pneumonia due to COVID-19 virus 07/13/2018  . CVA (cerebral vascular accident) ([place]) 07/13/2018  . Dementia due to Parkinson's disease without behavioral disturbance ([place]) 07/13/2018  . Major depressive disorder 07/13/2018  . Anxiety 07/13/2018  . History of myocardial infarction 07/13/2018  . Essential hypertension 07/13/2018  . Diabetes mellitus type 2, uncontrolled, with complications ([place]) 50/56/9794  . Diabetic neuropathy ([place]) 07/13/2018  . HLD (hyperlipidemia) 07/13/2018  . Nephrolithiasis 07/13/2018  . Generalized muscle weakness 07/13/2018  . Falls frequently 07/13/2018  . Functional urinary incontinence 07/13/2018  . Insomnia 07/13/2018  . Chronic pain syndrome 07/13/2018  . GERD (gastroesophageal reflux disease) 07/13/2018  . Restless leg syndrome 07/13/2018  .  Acute lower UTI 07/13/2018  . Acute respiratory failure with hypoxia ([place]) 07/13/2018   PCP:  Frederick Peers, DO Pharmacy:  No Pharmacies Listed    Social Determinants of Health (SDOH) Interventions    Readmission Risk Interventions No flowsheet data found.

## 2019-01-25 NOTE — Evaluation (Signed)
Physical Therapy Evaluation Patient Details Name: Jody Young MRN: [id number] DOB: 1945-03-25 Today's Date: 01/25/2019   History of Present Illness  Jody Young is a 73 y.o. female from SNF admitted for hypoxia secondary to COVID-19. PMH includes Parkinson's disease, multiple CVAs with residual left-sided hemiparesis, dysarthria, depression, MI x2, HTN, CAD, DM II, neuropathy, dyslipidemia, nephrolithiasis, chronic pain, GERD and covid-19 infection in June 2020 who presented to [facility] ED 12/10.  Clinical Impression  Pt is close to her baseline level of function.  This therapist is familiar with her from her previous admission in June.  She remains weak with flexion tone/tightness from an old stroke in her L UE/LE.  She was on 4 L O2 [place] during my session with VSS, 2/4 DOE.  Pt would benefit from return to SNF at discharge.  PT to follow acutely for deficits listed below.      Follow Up Recommendations SNF    Equipment Recommendations  Wheelchair (measurements PT);Wheelchair cushion (measurements PT);Hospital bed;Other (comment)(hoyer lift)    Recommendations for Other Services   NA    Precautions / Restrictions Precautions Precautions: Fall Precaution Comments: O@ Other Brace: uses braces at baseline Bil feet and LUE for contracture prevention (I did not see them in the room)      Mobility  Bed Mobility Overal bed mobility: Needs Assistance Bed Mobility: Rolling;Supine to Sit;Sit to Supine Rolling: Total assist   Supine to sit: Total assist Sit to supine: Total assist   General bed mobility comments: Pt total assist for transitions to and from EOB, for positioning in bed.   Transfers                 General transfer comment: NT, needed to confirm transfers with husband.  He confirms she gets to [facility] with SNF staff  Ambulation/Gait             General Gait Details: non ambulatory at baseline         Balance Overall balance assessment: Needs  assistance Sitting-balance support: Feet unsupported;No upper extremity supported Sitting balance-Leahy Scale: Zero Sitting balance - Comments: total assist to maintain sitting EOB.                                       Pertinent Vitals/Pain Pain Assessment: Faces Faces Pain Scale: Hurts little more Pain Location: limbs with stretching/ROM and back.   Pain Descriptors / Indicators: Grimacing;Guarding Pain Intervention(s): Limited activity within patient's tolerance;Monitored during session;Repositioned    Home Living Family/patient expects to be discharged to:: Skilled nursing facility([facility])                 Additional Comments: [facility]    Prior Function Level of Independence: Needs assistance   Gait / Transfers Assistance Needed: Pt usually gets up to her WC from 10-2 every day and goes back to bed and stays in the bed from 2pm until 10 the next morning (sleeping mostly)  ADL's / Homemaking Assistance Needed: assist for bathing and dressing  Comments: I spoke with husband to confirm.     Hand Dominance   Dominant Hand: Right    Extremity/Trunk Assessment   Upper Extremity Assessment Upper Extremity Assessment: Defer to OT evaluation    Lower Extremity Assessment Lower Extremity Assessment: LLE deficits/detail;RLE deficits/detail RLE Deficits / Details: right leg is weak and also tight lacking ~  10 degrees of neutral DF, can get knee close to full extension and otherwise, just generally tight from bedrest.  Right side is stronger than left due to PMH of stroke.  LLE Deficits / Details: left leg with flexion tightness/contracture, PF contracture, cannot fully straighten knee (lacking ~20 degrees of full extension.  Pt also with flexion spasm when I put her flat to move her up in the bed.      Cervical / Trunk Assessment Cervical / Trunk Assessment: Other exceptions Cervical / Trunk Exceptions: rounded  shoulders forward head, reports pain in her back with EOB mobility.   Communication   Communication: No difficulties  Cognition Arousal/Alertness: Lethargic Behavior During Therapy: Flat affect Overall Cognitive Status: History of cognitive impairments - at baseline                                 General Comments: baseline dementia, lethargic today, difficult to stay aroused.        General Comments General comments (skin integrity, edema, etc.): Pt on 4 L O2 [place] with VSS throughout session.  BP soft but stable 100s/50s.     Exercises Other Exercises Other Exercises: PROM to all 4 extremities, PROM to head and neck   Assessment/Plan    PT Assessment Patient needs continued PT services  PT Problem List Decreased strength;Decreased range of motion;Decreased activity tolerance;Decreased balance;Decreased mobility;Decreased coordination;Decreased cognition;Decreased knowledge of use of DME;Decreased safety awareness;Decreased knowledge of precautions;Cardiopulmonary status limiting activity;Pain;Impaired tone       PT Treatment Interventions Functional mobility training;Therapeutic activities;Therapeutic exercise;Balance training;Neuromuscular re-education;Cognitive remediation;Patient/family education;Wheelchair mobility training;Modalities    PT Goals (Current goals can be found in the Care Plan section)  Acute Rehab PT Goals Patient Stated Goal: to d/c back to SNF tomorrow PT Goal Formulation: With patient/family Time For Goal Achievement: 02/08/19 Potential to Achieve Goals: Good    Frequency Min 2X/week           AM-PAC PT "6 Clicks" Mobility  Outcome Measure Help needed turning from your back to your side while in a flat bed without using bedrails?: Total Help needed moving from lying on your back to sitting on the side of a flat bed without using bedrails?: Total Help needed moving to and from a bed to a chair (including a wheelchair)?: Total Help needed  standing up from a chair using your arms (e.g., wheelchair or bedside chair)?: Total Help needed to walk in hospital room?: Total Help needed climbing 3-5 steps with a railing? : Total 6 Click Score: 6    End of Session Equipment Utilized During Treatment: Oxygen Activity Tolerance: Patient limited by pain;Patient limited by fatigue Patient left: in bed;with call bell/phone within reach   PT Visit Diagnosis: Muscle weakness (generalized) (M62.81);Difficulty in walking, not elsewhere classified (R26.2);Pain Pain - Right/Left: (back) Pain - part of body: (back)    Time: 6010-9323 PT Time Calculation (min) (ACUTE ONLY): 29 min   Charges:        Verdene Lennert, PT, DPT  Acute Rehabilitation [phone] pager #[phone] office  @ [facility]: [phone]   PT Evaluation $PT Eval Moderate Complexity: 1 Mod PT Treatments $Therapeutic Activity: 8-22 mins       01/25/2019, 5:23 PM

## 2019-01-25 NOTE — Progress Notes (Signed)
PROGRESS NOTE RETINA BERNARDY  PPJ:[id number] DOB: Aug 10, 1945 DOA: 01/22/2019 PCP: Patient, No Pcp Per   Brief Narrative: Jody UNCAPHER is a 73 y.o. female with a history of Parkinson's disease, dementia, T2DM, CVA with residual left hemiparesis, HTN, and covid-19 infection who presented to [facility] ED 12/10 from SNF [facility]). The patient was treated for covid-19 pneumonia in June, discharged in improved condition with 2L O2 and had continued on 2L O2 since discharge. She was reportedly being treated with antibiotics recently for pneumonia but on the day of presentation, she was found to be hypoxic to 77% and febrile to 103F, so was placed on NRB and sent to ED. In ED 102.47F, HR 127bpm, RR 46/min, placed on 40% FiO2 through ventimask with SpO2 94%. WBC 15.5k, hgb 10.7 (baseline appears to be ~13), platelets 478. ABG on 60% FiO2 initially demonstrated pH 7.370, CO2 74, pO2 129, later redrawn on 40% with 7.410/66/76. SARS-CoV-2 PCR was positive. CXR read as hazy opacities identified in the right mid and lower lung with small right pleural effusion, clear left lung. Troponin 0.02. ALT 10, Cr 0.50. CRP >270, LDH and ferritin actually normal. UA negative for leuk esterase, nitrite, blood.  Goals of care discussion with the patient's husband by EDP due to patient reportedly shouting "I want to die," though goal remains curative intent with continued DNR. Patient required haldol due to agitation in ED, and was admitted at [facility].  Assessment & Plan: Principal Problem:   Acute respiratory failure with hypoxia (HCC) Active Problems:   Pneumonia due to COVID-19 virus   Dementia due to Parkinson's disease without behavioral disturbance (HCC)   Essential hypertension   Diabetes mellitus type 2, uncontrolled, with complications (HCC)   Pressure injury of skin  Acute hypoxic respiratory failure due to recurrent [id number] pneumonia complicated by RML and RLL HCAP: Was admitted and treated for covid-19 with  remdesivir and steroids 6/1 - 6/5, discharged on 2L O2 with prednisone taper to SNF. She has had serial testing at the facility, reportedly negative weekly including a couple days before admission, so this represents reinfection with superimposed bacterial component.  - CRP severely elevated, but showing significant improvement with treatment. Completed remdesivir (12/10 - 12/14).  - Continue vancomycin and cefepime, with undetectable PCT, final vanc dose today.  - Pt's husband reports she was on mechanical soft diet, will change to that. SLP saw no evidence of aspiration. - Continue supplemental oxygen, wean as tolerated, now down to 3L O2 - Continue steroids. Monitor inflammatory markers. - Continue airborne, contact precautions. PPE including surgical gown, gloves, cap, shoe covers, and CAPR used during this encounter in a negative pressure room.  - Continue chemical DVT ppx. D-dimer elevated and declining, will trend. - Maintain euvolemia/net negative.  - Avoid NSAIDs - Recommend proning and aggressive use of incentive spirometry. - Mobilize as much as possible, PT/OT ordered  Severe sepsis due to covid-19 pneumonia, and superimposed bacterial HCAP: Improving. - Monitor cultures taken at [facility] - Abx as above - Tx covid as above.  - Telemetry shows only sinus tachycardia and with clinical improvement she is stable for change in status to med-surg.   Acute metabolic encephalopathy: Ammonia 14. Note normal pH on ABG's despite hypercarbia. With elevated bicarb, this would be consistent with compensated respiratory acidosis. - Delirium precautions, improving. - Treat sepsis and covid as above.  - Risk mitigation w/frequent redirection, treating pain and toileting.  CAD s/p stent x2:  - Antiplatelet + statin  History of  CVA with residual left hemiparesis:  - Continue DAPT, statin  T2DM: HbA1c 7.1%.  - SSI ordered. At inpatient goal. - Holding invokana and metformin at this time.   Stage  IIIa CKD: Based on available lab values. - Avoid nephrotoxins, at baseline.  Parkinson's Disease:  - Continue sinemet.   History of urinary retention: Monitor UOP which has been normal.  HTN:  - Continue nebivolol and norvasc, improving control  History of tobacco abuse: Noted in EMR.   Anemia of chronic disease: Possibly.  - Monitor, stable.  Leukocytosis, thrombocytosis: More consistent with bacterial infection than uncomplicated covid.  - Monitor, relatively stable.  Anxiety:  - Klonopin and buspar continued at admission. With uncontrolled symptoms, will increase clonazepam for now.   RN Pressure Injury Documentation: Pressure Injury 01/22/19 Sacrum Mid;Upper Stage I -  Intact skin with non-blanchable redness of a localized area usually over a bony prominence. stage 1 on sacrum  (Active)  01/22/19 0626  Location: Sacrum  Location Orientation: Mid;Upper  Staging: Stage I -  Intact skin with non-blanchable redness of a localized area usually over a bony prominence.  Wound Description (Comments): stage 1 on sacrum   Present on Admission: Yes   DVT prophylaxis: Lovenox Code Status: DNR. Gold form in chart was signed in 2018.  Family Communication: Husband by phone daily Disposition Plan: PT recommends return to SNF. Hopeful for DC soon pending improvement in hypoxia.  Consultants:   Palliative care  Procedures:   None  Antimicrobials:  Vancomycin, cefepime, remdesivir   Subjective: Needs lift at facility, down to room air briefly, now 2-3L (overnight). Has no complaints, denies pain, doesn't remember me specifically but remembers my face.   Objective: Vitals:   01/24/19 2200 01/25/19 0000 01/25/19 0600 01/25/19 0819  BP: (!) 144/62 126/63 (!) 142/80 132/69  Pulse:  81 87 94  Resp: 19 (!) 23 (!) 25 20  Temp: 98.9 F (37.2 C) 98.8 F (37.1 C) 98.7 F (37.1 C) 97.8 F (36.6 C)  TempSrc: Oral Oral Oral Oral  SpO2: 91%  97% 91%  Weight:      Height:         Intake/Output Summary (Last 24 hours) at 01/25/2019 0946 Last data filed at 01/25/2019 0600 Gross per 24 hour  Intake 940 ml  Output 1150 ml  Net -210 ml   Filed Weights   01/22/19 0521  Weight: 61.4 kg   Gen: 73 y.o. female in no distress, chronically ill-appearing Pulm: Nonlabored breathing 1LPM. Clearing breath sounds. CV: Regular rate and rhythm. No murmur, rub, or gallop. No JVD, no dependent edema. GI: Abdomen soft, non-tender, non-distended, with normoactive bowel sounds.  Ext: Warm, no deformities Skin: No rashes, lesions or ulcers on visualized skin. Neuro: Alert and disoriented. No new focal neurological deficits. Left hemiparesis stable. Psych: Judgement and insight appear impaired. Mood euthymic & affect congruent. Behavior is appropriate.    Data Reviewed: I have personally reviewed following labs and imaging studies  CBC: Recent Labs  Lab 01/22/19 0717 01/23/19 0009 01/24/19 0521 01/25/19 0249  WBC 12.5* 10.9* 11.7* 12.2*  NEUTROABS 10.9* 9.1* 9.0* 10.1*  HGB 10.8* 10.6* 11.6* 12.1  HCT 36.8 35.8* 39.0 39.8  MCV 95.8 96.2 95.6 93.4  PLT 391 466* 372 458*   Basic Metabolic Panel: Recent Labs  Lab 01/22/19 0717 01/23/19 0009 01/24/19 0521 01/25/19 0249  NA 144 143 136 135  K 3.8 3.8 4.3 4.6  CL 93* 94* 90* 93*  CO2 36* 37* 31 29  GLUCOSE 150* 173* 119* 86  BUN 13 24* 23 19  CREATININE 0.53 0.56 0.41* 0.52  CALCIUM 9.0 9.0 8.9 8.6*   GFR: Estimated Creatinine Clearance: 52.5 mL/min (by C-G formula based on SCr of 0.52 mg/dL). Liver Function Tests: Recent Labs  Lab 01/22/19 0717 01/23/19 0009 01/24/19 0521 01/25/19 0249  AST 15 23 23 19   ALT 19 8 8 10   ALKPHOS 111 90 86 78  BILITOT 0.6 0.7 0.5 0.5  PROT 7.7 7.1 7.1 7.4  ALBUMIN 2.3* 2.3* 2.6* 2.7*   Anemia Panel: Recent Labs    01/24/19 0521 01/25/19 0249  FERRITIN 185 195   Scheduled Meds: . amLODipine  10 mg Oral Daily  . aspirin  81 mg Oral Daily  . busPIRone  10 mg Oral  BID  . carbidopa-levodopa  1 tablet Oral 3 times per day  . clopidogrel  75 mg Oral Daily  . dexamethasone  6 mg Oral Q24H  . docusate sodium  100 mg Oral Daily  . DULoxetine  20 mg Oral Daily  . enoxaparin (LOVENOX) injection  40 mg Subcutaneous QHS  . feeding supplement (ENSURE ENLIVE)  237 mL Oral BID BM  . insulin aspart  0-5 Units Subcutaneous QHS  . insulin aspart  0-9 Units Subcutaneous TID WC  . Ipratropium-Albuterol  1 puff Inhalation Q6H  . meclizine  12.5 mg Oral Daily  . nutrition supplement (JUVEN)  1 packet Oral BID BM  . pantoprazole  40 mg Oral Daily  . polyethylene glycol  17 g Oral Daily  . pramipexole  0.5 mg Oral BID  . pravastatin  20 mg Oral q1800  . vitamin C  500 mg Oral Daily  . zinc sulfate  220 mg Oral Daily   Continuous Infusions: . ceFEPime (MAXIPIME) IV 2 g (01/25/19 0125)  . remdesivir 100 mg in NS 100 mL 100 mg (01/24/19 1137)  . vancomycin 1,750 mg (01/23/19 1740)     LOS: 3 days   Time spent: 25 minutes.  01/26/19, MD [facility] [email or web address] 01/25/2019, 9:46 AM

## 2019-01-25 NOTE — Progress Notes (Signed)
Pharmacy Antibiotic Note  Jody Young is a 73 y.o. female admitted on 01/22/2019 with pneumonia.  Pharmacy has been consulted for Vancomycin / Cefepime dosing.  Received Vancomycin 1500 mg iv x 1 at [facility]  Scr 0.52, CrCl ~52.5  Plan: Decrease cefepime to 2g IV q12hr  Continue Vancomycin 1750 mg IV q48hr Follow up renal function, cultures/MRSA PCR, clinical progress  Height: 5\' 4"  (162.6 cm) Weight: 135 lb 5.8 oz (61.4 kg) IBW/kg (Calculated) : 54.7  Temp (24hrs), Avg:98.3 F (36.8 C), Min:97.5 F (36.4 C), Max:98.9 F (37.2 C)  Recent Labs  Lab 01/22/19 0717 01/23/19 0009 01/24/19 0521 01/25/19 0249  WBC 12.5* 10.9* 11.7* 12.2*  CREATININE 0.53 0.56 0.41* 0.52    Estimated Creatinine Clearance: 52.5 mL/min (by C-G formula based on SCr of 0.52 mg/dL).    Allergies  Allergen Reactions  . Astemizole Other (See Comments)    Hallucinations Hallucinations   . Niacin Other (See Comments)    Listed on [facility]    Agnes Lawrence, PharmD PGY1 Pharmacy Resident

## 2019-01-26 LAB — CBC WITH DIFFERENTIAL/PLATELET
Abs Immature Granulocytes: 0.21 10*3/uL — ABNORMAL HIGH (ref 0.00–0.07)
Basophils Absolute: 0 10*3/uL (ref 0.0–0.1)
Basophils Relative: 0 %
Eosinophils Absolute: 0 10*3/uL (ref 0.0–0.5)
Eosinophils Relative: 0 %
HCT: 37 % (ref 36.0–46.0)
Hemoglobin: 11.3 g/dL — ABNORMAL LOW (ref 12.0–15.0)
Immature Granulocytes: 2 %
Lymphocytes Relative: 10 %
Lymphs Abs: 1.1 10*3/uL (ref 0.7–4.0)
MCH: 28.3 pg (ref 26.0–34.0)
MCHC: 30.5 g/dL (ref 30.0–36.0)
MCV: 92.5 fL (ref 80.0–100.0)
Monocytes Absolute: 0.2 10*3/uL (ref 0.1–1.0)
Monocytes Relative: 2 %
Neutro Abs: 9.1 10*3/uL — ABNORMAL HIGH (ref 1.7–7.7)
Neutrophils Relative %: 86 %
Platelets: 503 10*3/uL — ABNORMAL HIGH (ref 150–400)
RBC: 4 MIL/uL (ref 3.87–5.11)
RDW: 12.4 % (ref 11.5–15.5)
WBC: 10.6 10*3/uL — ABNORMAL HIGH (ref 4.0–10.5)
nRBC: 0 % (ref 0.0–0.2)

## 2019-01-26 LAB — COMPREHENSIVE METABOLIC PANEL
ALT: 6 U/L (ref 0–44)
AST: 16 U/L (ref 15–41)
Albumin: 2.5 g/dL — ABNORMAL LOW (ref 3.5–5.0)
Alkaline Phosphatase: 72 U/L (ref 38–126)
Anion gap: 10 (ref 5–15)
BUN: 19 mg/dL (ref 8–23)
CO2: 34 mmol/L — ABNORMAL HIGH (ref 22–32)
Calcium: 8.6 mg/dL — ABNORMAL LOW (ref 8.9–10.3)
Chloride: 93 mmol/L — ABNORMAL LOW (ref 98–111)
Creatinine, Ser: 0.32 mg/dL — ABNORMAL LOW (ref 0.44–1.00)
GFR calc Af Amer: >60 mL/min (ref >60)
GFR calc non Af Amer: >60 mL/min (ref >60)
Glucose, Bld: 158 mg/dL — ABNORMAL HIGH (ref 70–99)
Potassium: 4.3 mmol/L (ref 3.5–5.1)
Sodium: 137 mmol/L (ref 135–145)
Total Bilirubin: 0.5 mg/dL (ref 0.3–1.2)
Total Protein: 6.7 g/dL (ref 6.5–8.1)

## 2019-01-26 LAB — D-DIMER, QUANTITATIVE: D-Dimer, Quant: 0.72 ug/mL-FEU — ABNORMAL HIGH (ref 0.00–0.50)

## 2019-01-26 LAB — GLUCOSE, CAPILLARY
Glucose-Capillary: 276 mg/dL — ABNORMAL HIGH (ref 70–99)
Glucose-Capillary: 231 mg/dL — ABNORMAL HIGH (ref 70–99)
Glucose-Capillary: 135 mg/dL — ABNORMAL HIGH (ref 70–99)
Glucose-Capillary: 208 mg/dL — ABNORMAL HIGH (ref 70–99)

## 2019-01-26 LAB — C-REACTIVE PROTEIN: CRP: 3 mg/dL — ABNORMAL HIGH (ref <1.0)

## 2019-01-26 MED ORDER — DEXAMETHASONE 6 MG PO TABS: 6.0000 mg | ORAL_TABLET | Freq: Every day | ORAL | 0 refills | Status: AC

## 2019-01-26 MED ORDER — CLONAZEPAM 0.5 MG PO TBDP: 0.5000 mg | ORAL_TABLET | Freq: Two times a day (BID) | ORAL | 0 refills | Status: AC

## 2019-01-26 NOTE — Progress Notes (Signed)
Report called to Donna, [place] at [facility].

## 2019-01-26 NOTE — [place] FL2 (Signed)
[place] MEDICAID FL2 LEVEL OF CARE SCREENING TOOL     IDENTIFICATION  Patient Name: Jody Young Birthdate: May 22, 1945 Sex: female Admission Date (Current Location): 01/22/2019  [place] and [place] Number:  Kathleen Argue [phone] [place] and Address:  [facility], [place], [place] [address]([facility])      Provider Number: [id number]  Attending Physician Name and Address:  Patrecia Pour, MD  Relative Name and Phone Number:       Current Level of Care: Hospital Recommended Level of Care: [place] Prior Approval Number:    Date Approved/Denied:   PASRR Number:    Discharge Plan: SNF    Current Diagnoses: Patient Active Problem List   Diagnosis Date Noted  . Pressure injury of skin 01/22/2019  . Pneumonia due to COVID-19 virus 07/13/2018  . CVA (cerebral vascular accident) ([place]) 07/13/2018  . Dementia due to Parkinson's disease without behavioral disturbance ([place]) 07/13/2018  . Major depressive disorder 07/13/2018  . Anxiety 07/13/2018  . History of myocardial infarction 07/13/2018  . Essential hypertension 07/13/2018  . Diabetes mellitus type 2, uncontrolled, with complications ([place]) 36/14/4315  . Diabetic neuropathy ([place]) 07/13/2018  . HLD (hyperlipidemia) 07/13/2018  . Nephrolithiasis 07/13/2018  . Generalized muscle weakness 07/13/2018  . Falls frequently 07/13/2018  . Functional urinary incontinence 07/13/2018  . Insomnia 07/13/2018  . Chronic pain syndrome 07/13/2018  . GERD (gastroesophageal reflux disease) 07/13/2018  . Restless leg syndrome 07/13/2018  . Acute lower UTI 07/13/2018  . Acute respiratory failure with hypoxia ([place]) 07/13/2018    Orientation RESPIRATION BLADDER Height & Weight     Self, Situation  O2(1L) Incontinent Weight: 125 lb 14.1 oz (57.1 kg) Height:  5\' 4"  (162.6 cm)  BEHAVIORAL SYMPTOMS/MOOD NEUROLOGICAL BOWEL NUTRITION STATUS      Incontinent Diet(Heart  Healthy, carb modified, mechanical soft.)  AMBULATORY STATUS COMMUNICATION OF NEEDS Skin   Total Care Verbally PU Stage and Appropriate Care(Sacrum: mid, upper)                       Personal Care Assistance Level of Assistance  Bathing, Dressing, Feeding Bathing Assistance: Maximum assistance Feeding assistance: Limited assistance Dressing Assistance: Maximum assistance     Functional Limitations Info  Sight, Hearing, Speech Sight Info: Adequate Hearing Info: Adequate Speech Info: Adequate    SPECIAL CARE FACTORS FREQUENCY  PT (By licensed PT), OT (By licensed OT)     PT Frequency: 5x/week OT Frequency: 3x/week            Contractures Contractures Info: Not present    Additional Factors Info  Code Status, Allergies, Psychotropic Code Status Info: DNR Allergies Info: Astemizole, Niacin Psychotropic Info: Buspar, Klonopin, Cymbalta         Current Medications (01/26/2019):  This is the current hospital active medication list Current Facility-Administered Medications  Medication Dose Route Frequency Provider Last Rate Last Admin  . acetaminophen (TYLENOL) tablet 650 mg  650 mg Oral Q6H PRN Peyton Bottoms, MD   650 mg at 01/26/19 0026  . amLODipine (NORVASC) tablet 10 mg  10 mg Oral Daily Peyton Bottoms, MD   10 mg at 01/26/19 0840  . aspirin chewable tablet 81 mg  81 mg Oral Daily Peyton Bottoms, MD   81 mg at 01/26/19 4008  . busPIRone (BUSPAR) tablet 10 mg  10 mg Oral BID Peyton Bottoms, MD   10 mg at 01/26/19 6761  . carbidopa-levodopa (SINEMET IR) 25-100 MG per tablet  immediate release 1 tablet  1 tablet Oral 3 times per day Coletta Memos, MD   1 tablet at 01/26/19 0841  . ceFEPIme (MAXIPIME) 2 g in sodium chloride 0.9 % 100 mL IVPB  2 g Intravenous Q12H Hazeline Junker B, MD 200 mL/hr at 01/26/19 0852 2 g at 01/26/19 0852  . chlorpheniramine-HYDROcodone (TUSSIONEX) 10-8 MG/5ML suspension 5 mL  5 mL Oral Q12H PRN Coletta Memos, MD      . clonazePAM [facility])  disintegrating tablet 1 mg  1 mg Oral BID PRN Tyrone Nine, MD   1 mg at 01/24/19 1810  . clopidogrel (PLAVIX) tablet 75 mg  75 mg Oral Daily Coletta Memos, MD   75 mg at 01/26/19 0839  . dexamethasone (DECADRON) tablet 6 mg  6 mg Oral Q24H Coletta Memos, MD   6 mg at 01/26/19 0840  . docusate sodium (COLACE) capsule 100 mg  100 mg Oral Daily Coletta Memos, MD   100 mg at 01/26/19 0840  . DULoxetine (CYMBALTA) DR capsule 20 mg  20 mg Oral Daily Coletta Memos, MD   20 mg at 01/26/19 0841  . enoxaparin (LOVENOX) injection 40 mg  40 mg Subcutaneous QHS Coletta Memos, MD   40 mg at 01/25/19 2135  . feeding supplement (ENSURE ENLIVE) (ENSURE ENLIVE) liquid 237 mL  237 mL Oral BID BM Tyrone Nine, MD   237 mL at 01/26/19 0841  . guaiFENesin-dextromethorphan (ROBITUSSIN DM) 100-10 MG/5ML syrup 10 mL  10 mL Oral Q4H PRN Coletta Memos, MD   10 mL at 01/24/19 0116  . insulin aspart (novoLOG) injection 0-5 Units  0-5 Units Subcutaneous QHS Tyrone Nine, MD   2 Units at 01/25/19 2146  . insulin aspart (novoLOG) injection 0-9 Units  0-9 Units Subcutaneous TID WC Tyrone Nine, MD   1 Units at 01/26/19 [phone]  . Ipratropium-Albuterol (COMBIVENT) respimat 1 puff  1 puff Inhalation Q6H Coletta Memos, MD   1 puff at 01/26/19 [phone]  . meclizine (ANTIVERT) tablet 12.5 mg  12.5 mg Oral Daily Coletta Memos, MD   12.5 mg at 01/26/19 5465  . nutrition supplement (JUVEN) (JUVEN) powder packet 1 packet  1 packet Oral BID BM Tyrone Nine, MD   1 packet at 01/26/19 [phone]  . pantoprazole (PROTONIX) EC tablet 40 mg  40 mg Oral Daily Coletta Memos, MD   40 mg at 01/26/19 0839  . polyethylene glycol (MIRALAX / GLYCOLAX) packet 17 g  17 g Oral Daily Coletta Memos, MD   17 g at 01/26/19 0840  . pramipexole (MIRAPEX) tablet 0.5 mg  0.5 mg Oral BID Coletta Memos, MD   0.5 mg at 01/26/19 7517  . pravastatin (PRAVACHOL) tablet 20 mg  20 mg Oral q1800 Coletta Memos, MD   20 mg at 01/25/19 1814  . vitamin C (ASCORBIC ACID) tablet 500 mg   500 mg Oral Daily Coletta Memos, MD   500 mg at 01/26/19 0840  . zinc sulfate capsule 220 mg  220 mg Oral Daily Coletta Memos, MD   220 mg at 01/26/19 0017     Discharge Medications: Please see discharge summary for a list of discharge medications.  Relevant Imaging Results:  Relevant Lab Results:   Additional Information SSN: [id number]  Annice Needy, LCSW

## 2019-01-26 NOTE — Discharge Summary (Signed)
Physician Discharge Summary  Jody Young ZOX:[id number] DOB: [birth year] DOA: 01/22/2019  PCP: Sherley Bounds, DO  Admit date: 01/22/2019 Discharge date: 01/26/2019  Admitted From: SNF Disposition: SNF   Recommendations for Outpatient Follow-up:  1. Follow up with PCP in 1-2 weeks 2. Please obtain CMP/CBC in one week  Home Health: N/A Equipment/Devices: 1L O2 Discharge Condition: Stable CODE STATUS: DNR Diet recommendation: Heart healthy, carb-modified mechanical soft  Brief/Interim Summary: Jody Young is a 73 y.o. female with a history of Parkinson's disease, dementia, T2DM, CVA with residual left hemiparesis, HTN, and covid-19 infection who presented to [facility] ED 12/10 from SNF [facility]). The patient was treated for covid-19 pneumonia in June, discharged in improved condition with 2L O2 and had continued on 2L O2 since discharge. She was reportedly being treated with antibiotics recently for pneumonia but on the day of presentation, she was found to be hypoxic to 77% and febrile to 103F, so was placed on NRB and sent to ED. In ED 102.37F, HR 127bpm, RR 46/min, placed on 40% FiO2 through ventimask with SpO2 94%. WBC 15.5k, hgb 10.7 (baseline appears to be ~13), platelets 478. ABG on 60% FiO2 initially demonstrated pH 7.370, CO2 74, pO2 129, later redrawn on 40% with 7.410/66/76. SARS-CoV-2 PCR was positive. CXR read as hazy opacities identified in the right mid and lower lung with small right pleural effusion, clear left lung. Troponin 0.02. ALT 10, Cr 0.50. CRP >270, LDH and ferritin actually normal. UA negative for leuk esterase, nitrite, blood.  Goals of care discussion with the patient's husband by EDP due to patient reportedly shouting "I want to die," though goal remains curative intent with continued DNR. Patient required haldol due to agitation in ED, and was admitted at [facility]. Antibiotics and repeat course of remdesivir were administered in addition to steroids with steady  improvement in hypoxia and respiratory effort.   Discharge Diagnoses:  Principal Problem:   Acute respiratory failure with hypoxia (HCC) Active Problems:   Pneumonia due to COVID-19 virus   Dementia due to Parkinson's disease without behavioral disturbance (HCC)   Essential hypertension   Diabetes mellitus type 2, uncontrolled, with complications (HCC)   Pressure injury of skin  Acute hypoxic respiratory failure due to recurrent covid-19 pneumonia complicated by RML and RLL HCAP: Was admitted and treated for covid-19 with remdesivir and steroids 6/1 - 6/5, discharged on 2L O2 with prednisone taper to SNF. She has had serial testing at the facility, reportedly negative weekly including a couple days before admission, so this represents reinfection with superimposed bacterial component.  - CRP severely elevated, but showing significant improvement with treatment, down to 3 at discharge. Completed remdesivir (12/10 - 12/14).  - Completed course of vancomycin and cefepime, with undetectable PCT - Pt's husband reports she was on mechanical soft diet, will change to that. SLP saw no evidence of aspiration. - Continue supplemental oxygen, wean as tolerated, now down to 1L O2 and no increased respiratory effort or dyspnea. - Continue steroids x4 more days.  - Continue isolation for 21 days from positive test.  - Continue chemical DVT ppx. D-dimer elevated but declined to 0.72 which is below age-adjusted ULN. - Maintain euvolemia/net negative.  - Avoid NSAIDs - Recommend continued incentive spirometry. - Mobilize as much as possible, PT/OT provided while admitted.  Severe sepsis due to covid-19 pneumonia, and superimposed bacterial HCAP: Improving. - Monitor cultures taken at [facility] - Abx completed as above - Tx covid as above.   Acute metabolic  encephalopathy: Ammonia 14. Improving.  - Delirium precautions  - Treat sepsis and covid as above.  - Risk mitigation w/frequent redirection, treating  pain and toileting.  CAD s/p stent x2:  - Antiplatelet + statin  History of CVA with residual left hemiparesis:  - Continue DAPT, statin  T2DM: HbA1c 7.1%.  - Remained at inpatient goal with SSI, restart home medications at discharge.  Stage IIIa CKD: Based on available lab values. - Avoid nephrotoxins, at baseline.  Parkinson's Disease:  - Continue sinemet.   History of urinary retention: Monitor UOP which has been normal.  HTN:  - Continue nebivolol and norvasc, improving control  History of tobacco abuse: Noted in EMR.   Anemia of chronic disease: Suspected diagnosis. - Monitor, stable.  Leukocytosis, thrombocytosis: More consistent with bacterial infection than uncomplicated covid.  - Monitor, relatively stable.  Anxiety:  - Klonopin and buspar continued   RN Pressure Injury Documentation: Pressure Injury 01/22/19 Sacrum Mid;Upper Stage I -  Intact skin with non-blanchable redness of a localized area usually over a bony prominence. stage 1 on sacrum  (Active)  01/22/19 0626  Location: Sacrum  Location Orientation: Mid;Upper  Staging: Stage I -  Intact skin with non-blanchable redness of a localized area usually over a bony prominence.  Wound Description (Comments): stage 1 on sacrum   Present on Admission: Yes   Plan to offload sacrum as much as possible   Discharge Instructions  Allergies as of 01/26/2019      Reactions   Astemizole Other (See Comments)   Hallucinations Hallucinations   Niacin Other (See Comments)   Listed on MAR      Medication List    STOP taking these medications   HYDROcodone-acetaminophen 5-325 MG tablet Commonly known as: NORCO/VICODIN   levofloxacin 750 MG tablet Commonly known as: LEVAQUIN   predniSONE 10 MG tablet Commonly known as: DELTASONE     TAKE these medications   acetaminophen 325 MG tablet Commonly known as: TYLENOL Take 650 mg by mouth every 6 (six) hours as needed for mild pain or moderate  pain.   Albuterol Sulfate 2.5 MG/0.5ML Nebu Inhale 0.5 mLs into the lungs every 4 (four) hours as needed (wheezing).   aspirin 81 MG chewable tablet Chew 81 mg by mouth daily.   busPIRone 10 MG tablet Commonly known as: BUSPAR Take 10 mg by mouth 2 (two) times a day.   Cerovite Senior Tabs Take 1 tablet by mouth daily.   clonazePAM 0.5 MG disintegrating tablet Commonly known as: KLONOPIN Take 1 tablet (0.5 mg total) by mouth 2 (two) times daily for 3 days.   cyclobenzaprine 5 MG tablet Commonly known as: FLEXERIL Take 5 mg by mouth every 12 (twelve) hours.   dexamethasone 6 MG tablet Commonly known as: Decadron Take 1 tablet (6 mg total) by mouth daily for 4 days.   docusate sodium 100 MG capsule Commonly known as: COLACE Take 100 mg by mouth daily.   DULoxetine 20 MG capsule Commonly known as: CYMBALTA Take 20 mg by mouth daily.   Farxiga 5 MG Tabs tablet Generic drug: dapagliflozin propanediol Take 5 mg by mouth daily.   furosemide 20 MG tablet Commonly known as: LASIX Take 20 mg by mouth every other day.   guaiFENesin 600 MG 12 hr tablet Commonly known as: MUCINEX Take 600 mg by mouth 2 (two) times daily. X 7 days   Livalo 2 MG Tabs Generic drug: Pitavastatin Calcium Take 2 mg by mouth daily.   meclizine 12.5  MG tablet Commonly known as: ANTIVERT Take 12.5 mg by mouth daily.   metFORMIN 500 MG tablet Commonly known as: GLUCOPHAGE Take by mouth 2 (two) times a day.   nitroGLYCERIN 0.4 MG SL tablet Commonly known as: NITROSTAT Place 0.4 mg under the tongue See admin instructions. Every 5 minutes up to 3 doses for chest pain.   Norvasc 10 MG tablet Generic drug: amLODipine Take 10 mg by mouth daily.   pantoprazole 40 MG tablet Commonly known as: PROTONIX Take 40 mg by mouth daily.   Plavix 75 MG tablet Generic drug: clopidogrel Take 75 mg by mouth daily.   polyethylene glycol 17 g packet Commonly known as: MIRALAX / GLYCOLAX Take 17 g by  mouth every 8 (eight) hours as needed for mild constipation or moderate constipation.   pramipexole 0.5 MG tablet Commonly known as: MIRAPEX Take 0.5 mg by mouth 2 (two) times a day.   Sinemet 25-100 MG tablet Generic drug: carbidopa-levodopa Take 1 tablet by mouth 3 (three) times daily. 9 am/ 1 pm / 9 pm   sodium chloride 5 % ophthalmic solution Commonly known as: MURO 128 Place 1 drop into both eyes 2 (two) times daily.   Systane Complete 0.6 % Soln Generic drug: Propylene Glycol Place 1 drop into both eyes 2 (two) times daily.   vitamin C 500 MG tablet Commonly known as: ASCORBIC ACID Take 500 mg by mouth daily.      Follow-up Information    Sherley BoundsHodge, Bryan, DO Follow up.   Specialty: Family Medicine Contact information: [address] [place] [address] [phone]          Allergies  Allergen Reactions  . Astemizole Other (See Comments)    Hallucinations Hallucinations   . Niacin Other (See Comments)    Listed on MAR    Consultations:  None  Procedures/Studies: As above  Subjective: Feels much better, no shortness of breath, denies chest pain. No fevers. Eating a little better.   Discharge Exam: Vitals:   01/26/19 0336 01/26/19 0711  BP: (!) 148/78 (!) 145/73  Pulse: 94 98  Resp:  18  Temp: (!) 97.1 F (36.2 C) 98.2 F (36.8 C)  SpO2: 99% 96%   General: Pt is alert, awake, not in acute distress Cardiovascular: RRR, S1/S2 +, no rubs, no gallops Respiratory: CTA bilaterally, no wheezing, no rhonchi Abdominal: Soft, NT, ND, bowel sounds + Extremities: No edema, no cyanosis. Left spastic hemiparesis stable.  Labs: BNP (last 3 results) Recent Labs    07/13/18 0620 07/14/18 1150 01/22/19 0717  BNP 14.0 50.1 69.6   Basic Metabolic Panel: Recent Labs  Lab 01/22/19 0717 01/23/19 0009 01/24/19 0521 01/25/19 0249 01/26/19 0252  NA 144 143 136 135 137  K 3.8 3.8 4.3 4.6 4.3  CL 93* 94* 90* 93* 93*  CO2 36*  37* 31 29 34*  GLUCOSE 150* 173* 119* 86 158*  BUN 13 24* 23 19 19   CREATININE 0.53 0.56 0.41* 0.52 0.32*  CALCIUM 9.0 9.0 8.9 8.6* 8.6*   Liver Function Tests: Recent Labs  Lab 01/22/19 0717 01/23/19 0009 01/24/19 0521 01/25/19 0249 01/26/19 0252  AST 15 23 23 19 16   ALT 19 8 8 10 6   ALKPHOS 111 90 86 78 72  BILITOT 0.6 0.7 0.5 0.5 0.5  PROT 7.7 7.1 7.1 7.4 6.7  ALBUMIN 2.3* 2.3* 2.6* 2.7* 2.5*   No results for input(s): LIPASE, AMYLASE in the last 168 hours. No results for input(s): AMMONIA  in the last 168 hours. CBC: Recent Labs  Lab 01/22/19 0717 01/23/19 0009 01/24/19 0521 01/25/19 0249 01/26/19 0252  WBC 12.5* 10.9* 11.7* 12.2* 10.6*  NEUTROABS 10.9* 9.1* 9.0* 10.1* 9.1*  HGB 10.8* 10.6* 11.6* 12.1 11.3*  HCT 36.8 35.8* 39.0 39.8 37.0  MCV 95.8 96.2 95.6 93.4 92.5  PLT 391 466* 372 458* 503*   Cardiac Enzymes: No results for input(s): CKTOTAL, CKMB, CKMBINDEX, TROPONINI in the last 168 hours. BNP: Invalid input(s): POCBNP CBG: Recent Labs  Lab 01/25/19 0740 01/25/19 1129 01/25/19 1505 01/25/19 2013 01/26/19 0729  GLUCAP 112* 120* 169* 243* 135*   D-Dimer Recent Labs    01/25/19 0249 01/26/19 0252  DDIMER 1.22* 0.72*   Hgb A1c No results for input(s): HGBA1C in the last 72 hours. Lipid Profile No results for input(s): CHOL, HDL, LDLCALC, TRIG, CHOLHDL, LDLDIRECT in the last 72 hours. Thyroid function studies No results for input(s): TSH, T4TOTAL, T3FREE, THYROIDAB in the last 72 hours.  Invalid input(s): FREET3 Anemia work up Recent Labs    01/24/19 0521 01/25/19 0249  FERRITIN 185 195   Urinalysis    Component Value Date/Time   COLORURINE YELLOW 07/14/2018 1557   APPEARANCEUR HAZY (A) 07/14/2018 1557   LABSPEC 1.017 07/14/2018 1557   PHURINE 5.0 07/14/2018 1557   GLUCOSEU >=500 (A) 07/14/2018 1557   HGBUR MODERATE (A) 07/14/2018 1557   BILIRUBINUR NEGATIVE 07/14/2018 1557   KETONESUR 5 (A) 07/14/2018 1557   PROTEINUR NEGATIVE  07/14/2018 1557   NITRITE NEGATIVE 07/14/2018 1557   LEUKOCYTESUR SMALL (A) 07/14/2018 1557    Microbiology No results found for this or any previous visit (from the past 240 hour(s)).  Time coordinating discharge: Approximately 40 minutes  Tyrone Nine, MD  [facility] 01/26/2019, 9:13 AM

## 2019-01-26 NOTE — Care Management Important Message (Signed)
Important Message  Patient Details  Name: Jody Young MRN: [id number] Date of Birth: [birth year]   Medicare Important Message Given:  Yes - Important Message mailed due to current National Emergency  Verbal consent obtained due to current National Emergency  Relationship to patient: Spouse/Significant Other Contact Name: Leidy Massar Call Date: 01/26/19  Time: 1141 Phone: [phone] Outcome: Spoke with contact Important Message mailed to: Patient address on file    Delorse Lek 01/26/2019, 11:41 AM

## 2019-01-26 NOTE — Plan of Care (Signed)

## 2019-01-26 NOTE — TOC Transition Note (Addendum)
Transition of Care [facility]) - CM/SW Discharge Note   Patient Details  Name: [name] MRN: [id number] Date of Birth: 06-Oct-1945  [facility] CM/SW Contact:  Ihor Gully, LCSW Phone Number: 01/26/2019, 11:12 AM   Clinical Narrative:    Notified [name] of discharge.  Spoke with Arbie Cookey at [facility] of discharge. Discharge clinicals sent to facility.  RN to call report.  Transport arranged for 1:00 p.m. via [facility]     Barriers to Discharge: Continued Medical Work up   Patient Goals and CMS Choice        Discharge Placement              Patient chooses bed at: [facility] Patient to be transferred to facility by: [facility] Name of family member notified: spouse, [name] Patient and family notified of of transfer: 01/26/19  Discharge Plan and Services                                     Social Determinants of Health (SDOH) Interventions     Readmission Risk Interventions No flowsheet data found.

## 2019-02-10 DIAGNOSIS — Z23 Encounter for immunization: Secondary | ICD-10-CM | POA: Diagnosis not present

## 2019-02-12 DIAGNOSIS — E1140 Type 2 diabetes mellitus with diabetic neuropathy, unspecified: Secondary | ICD-10-CM | POA: Diagnosis not present

## 2019-02-12 DIAGNOSIS — G20 Parkinson's disease: Secondary | ICD-10-CM | POA: Diagnosis not present

## 2019-02-12 DIAGNOSIS — F323 Major depressive disorder, single episode, severe with psychotic features: Secondary | ICD-10-CM | POA: Diagnosis not present

## 2019-02-12 DIAGNOSIS — U071 COVID-19: Secondary | ICD-10-CM | POA: Diagnosis not present

## 2019-02-12 DIAGNOSIS — I2541 Coronary artery aneurysm: Secondary | ICD-10-CM | POA: Diagnosis not present

## 2019-02-12 DIAGNOSIS — Z9181 History of falling: Secondary | ICD-10-CM | POA: Diagnosis not present

## 2019-02-12 DIAGNOSIS — J9601 Acute respiratory failure with hypoxia: Secondary | ICD-10-CM | POA: Diagnosis not present

## 2019-02-12 DIAGNOSIS — F411 Generalized anxiety disorder: Secondary | ICD-10-CM | POA: Diagnosis not present

## 2019-02-12 DIAGNOSIS — R471 Dysarthria and anarthria: Secondary | ICD-10-CM | POA: Diagnosis not present

## 2019-02-12 DIAGNOSIS — Z1159 Encounter for screening for other viral diseases: Secondary | ICD-10-CM | POA: Diagnosis not present

## 2019-02-12 DIAGNOSIS — R41841 Cognitive communication deficit: Secondary | ICD-10-CM | POA: Diagnosis not present

## 2019-02-12 DIAGNOSIS — M6281 Muscle weakness (generalized): Secondary | ICD-10-CM | POA: Diagnosis not present

## 2019-02-12 DIAGNOSIS — I69354 Hemiplegia and hemiparesis following cerebral infarction affecting left non-dominant side: Secondary | ICD-10-CM | POA: Diagnosis not present

## 2019-02-12 DIAGNOSIS — M25642 Stiffness of left hand, not elsewhere classified: Secondary | ICD-10-CM | POA: Diagnosis not present

## 2019-02-12 DIAGNOSIS — R1312 Dysphagia, oropharyngeal phase: Secondary | ICD-10-CM | POA: Diagnosis not present

## 2019-02-12 DIAGNOSIS — J189 Pneumonia, unspecified organism: Secondary | ICD-10-CM | POA: Diagnosis not present

## 2019-02-12 DIAGNOSIS — G894 Chronic pain syndrome: Secondary | ICD-10-CM | POA: Diagnosis not present

## 2019-02-12 DIAGNOSIS — M25622 Stiffness of left elbow, not elsewhere classified: Secondary | ICD-10-CM | POA: Diagnosis not present

## 2019-02-12 DIAGNOSIS — G2581 Restless legs syndrome: Secondary | ICD-10-CM | POA: Diagnosis not present

## 2019-02-12 DIAGNOSIS — F0280 Dementia in other diseases classified elsewhere without behavioral disturbance: Secondary | ICD-10-CM | POA: Diagnosis not present

## 2019-02-12 DIAGNOSIS — M25612 Stiffness of left shoulder, not elsewhere classified: Secondary | ICD-10-CM | POA: Diagnosis not present

## 2019-02-23 DIAGNOSIS — F323 Major depressive disorder, single episode, severe with psychotic features: Secondary | ICD-10-CM | POA: Diagnosis not present

## 2019-02-23 DIAGNOSIS — F411 Generalized anxiety disorder: Secondary | ICD-10-CM | POA: Diagnosis not present

## 2019-02-23 DIAGNOSIS — F0280 Dementia in other diseases classified elsewhere without behavioral disturbance: Secondary | ICD-10-CM | POA: Diagnosis not present

## 2019-02-25 ENCOUNTER — Other Ambulatory Visit: Payer: Self-pay | Admitting: *Deleted

## 2019-02-25 NOTE — Patient Outreach (Signed)
Member screened for potential [facility] Care Management needs as a benefit of [employer].  Jody Young is currently receiving skilled therapy at [facility]. She was in long term care previously.  Will continue to follow for transition plans and potential [facility] needs.  Raiford Noble, MSN-Ed, RN,BSN [facility] Post Acute Care Coordinator [phone] [facility]) [phone]  (Toll free office)

## 2019-02-28 DIAGNOSIS — R278 Other lack of coordination: Secondary | ICD-10-CM | POA: Diagnosis not present

## 2019-02-28 DIAGNOSIS — M6281 Muscle weakness (generalized): Secondary | ICD-10-CM | POA: Diagnosis not present

## 2019-02-28 DIAGNOSIS — M25642 Stiffness of left hand, not elsewhere classified: Secondary | ICD-10-CM | POA: Diagnosis not present

## 2019-02-28 DIAGNOSIS — R1312 Dysphagia, oropharyngeal phase: Secondary | ICD-10-CM | POA: Diagnosis not present

## 2019-02-28 DIAGNOSIS — R262 Difficulty in walking, not elsewhere classified: Secondary | ICD-10-CM | POA: Diagnosis not present

## 2019-02-28 DIAGNOSIS — Z9181 History of falling: Secondary | ICD-10-CM | POA: Diagnosis not present

## 2019-02-28 DIAGNOSIS — R41841 Cognitive communication deficit: Secondary | ICD-10-CM | POA: Diagnosis not present

## 2019-03-01 DIAGNOSIS — R262 Difficulty in walking, not elsewhere classified: Secondary | ICD-10-CM | POA: Diagnosis not present

## 2019-03-01 DIAGNOSIS — E1142 Type 2 diabetes mellitus with diabetic polyneuropathy: Secondary | ICD-10-CM | POA: Diagnosis not present

## 2019-03-01 DIAGNOSIS — Z9181 History of falling: Secondary | ICD-10-CM | POA: Diagnosis not present

## 2019-03-01 DIAGNOSIS — I119 Hypertensive heart disease without heart failure: Secondary | ICD-10-CM | POA: Diagnosis not present

## 2019-03-01 DIAGNOSIS — I2510 Atherosclerotic heart disease of native coronary artery without angina pectoris: Secondary | ICD-10-CM | POA: Diagnosis not present

## 2019-03-01 DIAGNOSIS — R41841 Cognitive communication deficit: Secondary | ICD-10-CM | POA: Diagnosis not present

## 2019-03-01 DIAGNOSIS — G20 Parkinson's disease: Secondary | ICD-10-CM | POA: Diagnosis not present

## 2019-03-01 DIAGNOSIS — M25642 Stiffness of left hand, not elsewhere classified: Secondary | ICD-10-CM | POA: Diagnosis not present

## 2019-03-01 DIAGNOSIS — M6281 Muscle weakness (generalized): Secondary | ICD-10-CM | POA: Diagnosis not present

## 2019-03-01 DIAGNOSIS — R1312 Dysphagia, oropharyngeal phase: Secondary | ICD-10-CM | POA: Diagnosis not present

## 2019-03-02 ENCOUNTER — Other Ambulatory Visit: Payer: Self-pay | Admitting: *Deleted

## 2019-03-02 DIAGNOSIS — R1312 Dysphagia, oropharyngeal phase: Secondary | ICD-10-CM | POA: Diagnosis not present

## 2019-03-02 DIAGNOSIS — R41841 Cognitive communication deficit: Secondary | ICD-10-CM | POA: Diagnosis not present

## 2019-03-02 DIAGNOSIS — I2510 Atherosclerotic heart disease of native coronary artery without angina pectoris: Secondary | ICD-10-CM | POA: Diagnosis not present

## 2019-03-02 DIAGNOSIS — G20 Parkinson's disease: Secondary | ICD-10-CM | POA: Diagnosis not present

## 2019-03-02 DIAGNOSIS — Z9181 History of falling: Secondary | ICD-10-CM | POA: Diagnosis not present

## 2019-03-02 DIAGNOSIS — M6281 Muscle weakness (generalized): Secondary | ICD-10-CM | POA: Diagnosis not present

## 2019-03-02 DIAGNOSIS — E1142 Type 2 diabetes mellitus with diabetic polyneuropathy: Secondary | ICD-10-CM | POA: Diagnosis not present

## 2019-03-02 DIAGNOSIS — M25642 Stiffness of left hand, not elsewhere classified: Secondary | ICD-10-CM | POA: Diagnosis not present

## 2019-03-02 DIAGNOSIS — I119 Hypertensive heart disease without heart failure: Secondary | ICD-10-CM | POA: Diagnosis not present

## 2019-03-02 DIAGNOSIS — R262 Difficulty in walking, not elsewhere classified: Secondary | ICD-10-CM | POA: Diagnosis not present

## 2019-03-02 NOTE — Patient Outreach (Signed)
Member screened for potential [facility] Care Management needs as a benefit of [employer].  Verified in Patient Ilda Foil that member has discharged from [facility]. Confirmed in [facility] UM documentation that member transitioned to [facility] for long term placement.  No identifiable [facility] Care Management needs at this time.   Raiford Noble, MSN-Ed, RN,BSN [facility] Post Acute Care Coordinator [phone] [facility]) [phone]  (Toll free office)

## 2019-03-03 DIAGNOSIS — M6281 Muscle weakness (generalized): Secondary | ICD-10-CM | POA: Diagnosis not present

## 2019-03-03 DIAGNOSIS — R1312 Dysphagia, oropharyngeal phase: Secondary | ICD-10-CM | POA: Diagnosis not present

## 2019-03-03 DIAGNOSIS — Z9181 History of falling: Secondary | ICD-10-CM | POA: Diagnosis not present

## 2019-03-03 DIAGNOSIS — R41841 Cognitive communication deficit: Secondary | ICD-10-CM | POA: Diagnosis not present

## 2019-03-03 DIAGNOSIS — R262 Difficulty in walking, not elsewhere classified: Secondary | ICD-10-CM | POA: Diagnosis not present

## 2019-03-03 DIAGNOSIS — M25642 Stiffness of left hand, not elsewhere classified: Secondary | ICD-10-CM | POA: Diagnosis not present

## 2019-03-04 DIAGNOSIS — R262 Difficulty in walking, not elsewhere classified: Secondary | ICD-10-CM | POA: Diagnosis not present

## 2019-03-04 DIAGNOSIS — R1312 Dysphagia, oropharyngeal phase: Secondary | ICD-10-CM | POA: Diagnosis not present

## 2019-03-04 DIAGNOSIS — M25642 Stiffness of left hand, not elsewhere classified: Secondary | ICD-10-CM | POA: Diagnosis not present

## 2019-03-04 DIAGNOSIS — M6281 Muscle weakness (generalized): Secondary | ICD-10-CM | POA: Diagnosis not present

## 2019-03-04 DIAGNOSIS — R41841 Cognitive communication deficit: Secondary | ICD-10-CM | POA: Diagnosis not present

## 2019-03-04 DIAGNOSIS — Z9181 History of falling: Secondary | ICD-10-CM | POA: Diagnosis not present

## 2019-03-05 DIAGNOSIS — R41841 Cognitive communication deficit: Secondary | ICD-10-CM | POA: Diagnosis not present

## 2019-03-05 DIAGNOSIS — R1312 Dysphagia, oropharyngeal phase: Secondary | ICD-10-CM | POA: Diagnosis not present

## 2019-03-05 DIAGNOSIS — R262 Difficulty in walking, not elsewhere classified: Secondary | ICD-10-CM | POA: Diagnosis not present

## 2019-03-05 DIAGNOSIS — M25642 Stiffness of left hand, not elsewhere classified: Secondary | ICD-10-CM | POA: Diagnosis not present

## 2019-03-05 DIAGNOSIS — Z9181 History of falling: Secondary | ICD-10-CM | POA: Diagnosis not present

## 2019-03-05 DIAGNOSIS — M6281 Muscle weakness (generalized): Secondary | ICD-10-CM | POA: Diagnosis not present

## 2019-03-06 DIAGNOSIS — R1312 Dysphagia, oropharyngeal phase: Secondary | ICD-10-CM | POA: Diagnosis not present

## 2019-03-06 DIAGNOSIS — M25642 Stiffness of left hand, not elsewhere classified: Secondary | ICD-10-CM | POA: Diagnosis not present

## 2019-03-06 DIAGNOSIS — Z9181 History of falling: Secondary | ICD-10-CM | POA: Diagnosis not present

## 2019-03-06 DIAGNOSIS — R262 Difficulty in walking, not elsewhere classified: Secondary | ICD-10-CM | POA: Diagnosis not present

## 2019-03-06 DIAGNOSIS — R41841 Cognitive communication deficit: Secondary | ICD-10-CM | POA: Diagnosis not present

## 2019-03-06 DIAGNOSIS — M6281 Muscle weakness (generalized): Secondary | ICD-10-CM | POA: Diagnosis not present

## 2019-03-08 DIAGNOSIS — R41841 Cognitive communication deficit: Secondary | ICD-10-CM | POA: Diagnosis not present

## 2019-03-08 DIAGNOSIS — M25642 Stiffness of left hand, not elsewhere classified: Secondary | ICD-10-CM | POA: Diagnosis not present

## 2019-03-08 DIAGNOSIS — Z9181 History of falling: Secondary | ICD-10-CM | POA: Diagnosis not present

## 2019-03-08 DIAGNOSIS — I119 Hypertensive heart disease without heart failure: Secondary | ICD-10-CM | POA: Diagnosis not present

## 2019-03-08 DIAGNOSIS — R1312 Dysphagia, oropharyngeal phase: Secondary | ICD-10-CM | POA: Diagnosis not present

## 2019-03-08 DIAGNOSIS — I2510 Atherosclerotic heart disease of native coronary artery without angina pectoris: Secondary | ICD-10-CM | POA: Diagnosis not present

## 2019-03-08 DIAGNOSIS — R262 Difficulty in walking, not elsewhere classified: Secondary | ICD-10-CM | POA: Diagnosis not present

## 2019-03-08 DIAGNOSIS — G20 Parkinson's disease: Secondary | ICD-10-CM | POA: Diagnosis not present

## 2019-03-08 DIAGNOSIS — G894 Chronic pain syndrome: Secondary | ICD-10-CM | POA: Diagnosis not present

## 2019-03-08 DIAGNOSIS — M6281 Muscle weakness (generalized): Secondary | ICD-10-CM | POA: Diagnosis not present

## 2019-03-08 DIAGNOSIS — Z03818 Encounter for observation for suspected exposure to other biological agents ruled out: Secondary | ICD-10-CM | POA: Diagnosis not present

## 2019-03-09 DIAGNOSIS — M25642 Stiffness of left hand, not elsewhere classified: Secondary | ICD-10-CM | POA: Diagnosis not present

## 2019-03-09 DIAGNOSIS — Z9181 History of falling: Secondary | ICD-10-CM | POA: Diagnosis not present

## 2019-03-09 DIAGNOSIS — M6281 Muscle weakness (generalized): Secondary | ICD-10-CM | POA: Diagnosis not present

## 2019-03-09 DIAGNOSIS — R1312 Dysphagia, oropharyngeal phase: Secondary | ICD-10-CM | POA: Diagnosis not present

## 2019-03-09 DIAGNOSIS — R41841 Cognitive communication deficit: Secondary | ICD-10-CM | POA: Diagnosis not present

## 2019-03-09 DIAGNOSIS — R262 Difficulty in walking, not elsewhere classified: Secondary | ICD-10-CM | POA: Diagnosis not present

## 2019-03-10 DIAGNOSIS — M6281 Muscle weakness (generalized): Secondary | ICD-10-CM | POA: Diagnosis not present

## 2019-03-10 DIAGNOSIS — R41841 Cognitive communication deficit: Secondary | ICD-10-CM | POA: Diagnosis not present

## 2019-03-10 DIAGNOSIS — R1312 Dysphagia, oropharyngeal phase: Secondary | ICD-10-CM | POA: Diagnosis not present

## 2019-03-10 DIAGNOSIS — M25642 Stiffness of left hand, not elsewhere classified: Secondary | ICD-10-CM | POA: Diagnosis not present

## 2019-03-10 DIAGNOSIS — Z9181 History of falling: Secondary | ICD-10-CM | POA: Diagnosis not present

## 2019-03-10 DIAGNOSIS — R262 Difficulty in walking, not elsewhere classified: Secondary | ICD-10-CM | POA: Diagnosis not present

## 2019-03-11 DIAGNOSIS — R1312 Dysphagia, oropharyngeal phase: Secondary | ICD-10-CM | POA: Diagnosis not present

## 2019-03-11 DIAGNOSIS — Z9181 History of falling: Secondary | ICD-10-CM | POA: Diagnosis not present

## 2019-03-11 DIAGNOSIS — R41841 Cognitive communication deficit: Secondary | ICD-10-CM | POA: Diagnosis not present

## 2019-03-11 DIAGNOSIS — T148XXA Other injury of unspecified body region, initial encounter: Secondary | ICD-10-CM | POA: Diagnosis not present

## 2019-03-11 DIAGNOSIS — M6281 Muscle weakness (generalized): Secondary | ICD-10-CM | POA: Diagnosis not present

## 2019-03-11 DIAGNOSIS — M25642 Stiffness of left hand, not elsewhere classified: Secondary | ICD-10-CM | POA: Diagnosis not present

## 2019-03-11 DIAGNOSIS — R262 Difficulty in walking, not elsewhere classified: Secondary | ICD-10-CM | POA: Diagnosis not present

## 2019-03-12 DIAGNOSIS — M25642 Stiffness of left hand, not elsewhere classified: Secondary | ICD-10-CM | POA: Diagnosis not present

## 2019-03-12 DIAGNOSIS — R41841 Cognitive communication deficit: Secondary | ICD-10-CM | POA: Diagnosis not present

## 2019-03-12 DIAGNOSIS — R1312 Dysphagia, oropharyngeal phase: Secondary | ICD-10-CM | POA: Diagnosis not present

## 2019-03-12 DIAGNOSIS — R262 Difficulty in walking, not elsewhere classified: Secondary | ICD-10-CM | POA: Diagnosis not present

## 2019-03-12 DIAGNOSIS — Z9181 History of falling: Secondary | ICD-10-CM | POA: Diagnosis not present

## 2019-03-12 DIAGNOSIS — M6281 Muscle weakness (generalized): Secondary | ICD-10-CM | POA: Diagnosis not present

## 2019-03-13 DIAGNOSIS — M6281 Muscle weakness (generalized): Secondary | ICD-10-CM | POA: Diagnosis not present

## 2019-03-13 DIAGNOSIS — R262 Difficulty in walking, not elsewhere classified: Secondary | ICD-10-CM | POA: Diagnosis not present

## 2019-03-13 DIAGNOSIS — Z9181 History of falling: Secondary | ICD-10-CM | POA: Diagnosis not present

## 2019-03-13 DIAGNOSIS — R41841 Cognitive communication deficit: Secondary | ICD-10-CM | POA: Diagnosis not present

## 2019-03-13 DIAGNOSIS — M25642 Stiffness of left hand, not elsewhere classified: Secondary | ICD-10-CM | POA: Diagnosis not present

## 2019-03-13 DIAGNOSIS — R1312 Dysphagia, oropharyngeal phase: Secondary | ICD-10-CM | POA: Diagnosis not present

## 2019-03-14 DIAGNOSIS — R1110 Vomiting, unspecified: Secondary | ICD-10-CM | POA: Diagnosis not present

## 2019-03-15 DIAGNOSIS — E1142 Type 2 diabetes mellitus with diabetic polyneuropathy: Secondary | ICD-10-CM | POA: Diagnosis not present

## 2019-03-15 DIAGNOSIS — I2510 Atherosclerotic heart disease of native coronary artery without angina pectoris: Secondary | ICD-10-CM | POA: Diagnosis not present

## 2019-03-15 DIAGNOSIS — G20 Parkinson's disease: Secondary | ICD-10-CM | POA: Diagnosis not present

## 2019-03-15 DIAGNOSIS — I119 Hypertensive heart disease without heart failure: Secondary | ICD-10-CM | POA: Diagnosis not present

## 2019-03-15 DIAGNOSIS — R41841 Cognitive communication deficit: Secondary | ICD-10-CM | POA: Diagnosis not present

## 2019-03-15 DIAGNOSIS — R1312 Dysphagia, oropharyngeal phase: Secondary | ICD-10-CM | POA: Diagnosis not present

## 2019-03-15 DIAGNOSIS — Z03818 Encounter for observation for suspected exposure to other biological agents ruled out: Secondary | ICD-10-CM | POA: Diagnosis not present

## 2019-03-16 DIAGNOSIS — R1312 Dysphagia, oropharyngeal phase: Secondary | ICD-10-CM | POA: Diagnosis not present

## 2019-03-16 DIAGNOSIS — R41841 Cognitive communication deficit: Secondary | ICD-10-CM | POA: Diagnosis not present

## 2019-03-17 DIAGNOSIS — R1312 Dysphagia, oropharyngeal phase: Secondary | ICD-10-CM | POA: Diagnosis not present

## 2019-03-17 DIAGNOSIS — R41841 Cognitive communication deficit: Secondary | ICD-10-CM | POA: Diagnosis not present

## 2019-03-18 DIAGNOSIS — R41841 Cognitive communication deficit: Secondary | ICD-10-CM | POA: Diagnosis not present

## 2019-03-18 DIAGNOSIS — R1312 Dysphagia, oropharyngeal phase: Secondary | ICD-10-CM | POA: Diagnosis not present

## 2019-03-19 DIAGNOSIS — R41841 Cognitive communication deficit: Secondary | ICD-10-CM | POA: Diagnosis not present

## 2019-03-19 DIAGNOSIS — R1312 Dysphagia, oropharyngeal phase: Secondary | ICD-10-CM | POA: Diagnosis not present

## 2019-03-22 DIAGNOSIS — G894 Chronic pain syndrome: Secondary | ICD-10-CM | POA: Diagnosis not present

## 2019-03-22 DIAGNOSIS — G20 Parkinson's disease: Secondary | ICD-10-CM | POA: Diagnosis not present

## 2019-03-22 DIAGNOSIS — I2510 Atherosclerotic heart disease of native coronary artery without angina pectoris: Secondary | ICD-10-CM | POA: Diagnosis not present

## 2019-03-22 DIAGNOSIS — Z03818 Encounter for observation for suspected exposure to other biological agents ruled out: Secondary | ICD-10-CM | POA: Diagnosis not present

## 2019-03-22 DIAGNOSIS — R41841 Cognitive communication deficit: Secondary | ICD-10-CM | POA: Diagnosis not present

## 2019-03-22 DIAGNOSIS — Z23 Encounter for immunization: Secondary | ICD-10-CM | POA: Diagnosis not present

## 2019-03-22 DIAGNOSIS — R1312 Dysphagia, oropharyngeal phase: Secondary | ICD-10-CM | POA: Diagnosis not present

## 2019-03-22 DIAGNOSIS — I119 Hypertensive heart disease without heart failure: Secondary | ICD-10-CM | POA: Diagnosis not present

## 2019-03-23 DIAGNOSIS — R41841 Cognitive communication deficit: Secondary | ICD-10-CM | POA: Diagnosis not present

## 2019-03-23 DIAGNOSIS — R1312 Dysphagia, oropharyngeal phase: Secondary | ICD-10-CM | POA: Diagnosis not present

## 2019-03-24 DIAGNOSIS — R1312 Dysphagia, oropharyngeal phase: Secondary | ICD-10-CM | POA: Diagnosis not present

## 2019-03-24 DIAGNOSIS — R41841 Cognitive communication deficit: Secondary | ICD-10-CM | POA: Diagnosis not present

## 2019-03-25 DIAGNOSIS — R41841 Cognitive communication deficit: Secondary | ICD-10-CM | POA: Diagnosis not present

## 2019-03-25 DIAGNOSIS — R1312 Dysphagia, oropharyngeal phase: Secondary | ICD-10-CM | POA: Diagnosis not present

## 2019-03-26 DIAGNOSIS — R1312 Dysphagia, oropharyngeal phase: Secondary | ICD-10-CM | POA: Diagnosis not present

## 2019-03-26 DIAGNOSIS — R41841 Cognitive communication deficit: Secondary | ICD-10-CM | POA: Diagnosis not present

## 2019-03-28 DIAGNOSIS — R41841 Cognitive communication deficit: Secondary | ICD-10-CM | POA: Diagnosis not present

## 2019-03-28 DIAGNOSIS — R1312 Dysphagia, oropharyngeal phase: Secondary | ICD-10-CM | POA: Diagnosis not present

## 2019-03-29 DIAGNOSIS — R1312 Dysphagia, oropharyngeal phase: Secondary | ICD-10-CM | POA: Diagnosis not present

## 2019-03-29 DIAGNOSIS — G20 Parkinson's disease: Secondary | ICD-10-CM | POA: Diagnosis not present

## 2019-03-29 DIAGNOSIS — R41841 Cognitive communication deficit: Secondary | ICD-10-CM | POA: Diagnosis not present

## 2019-03-29 DIAGNOSIS — I2510 Atherosclerotic heart disease of native coronary artery without angina pectoris: Secondary | ICD-10-CM | POA: Diagnosis not present

## 2019-03-29 DIAGNOSIS — I119 Hypertensive heart disease without heart failure: Secondary | ICD-10-CM | POA: Diagnosis not present

## 2019-03-29 DIAGNOSIS — F0281 Dementia in other diseases classified elsewhere with behavioral disturbance: Secondary | ICD-10-CM | POA: Diagnosis not present

## 2019-03-30 DIAGNOSIS — Z79899 Other long term (current) drug therapy: Secondary | ICD-10-CM | POA: Diagnosis not present

## 2019-03-30 DIAGNOSIS — R300 Dysuria: Secondary | ICD-10-CM | POA: Diagnosis not present

## 2019-03-30 DIAGNOSIS — Z760 Encounter for issue of repeat prescription: Secondary | ICD-10-CM | POA: Diagnosis not present

## 2019-03-30 DIAGNOSIS — R319 Hematuria, unspecified: Secondary | ICD-10-CM | POA: Diagnosis not present

## 2019-04-02 DIAGNOSIS — I119 Hypertensive heart disease without heart failure: Secondary | ICD-10-CM | POA: Diagnosis not present

## 2019-04-02 DIAGNOSIS — G20 Parkinson's disease: Secondary | ICD-10-CM | POA: Diagnosis not present

## 2019-04-02 DIAGNOSIS — E1142 Type 2 diabetes mellitus with diabetic polyneuropathy: Secondary | ICD-10-CM | POA: Diagnosis not present

## 2019-04-02 DIAGNOSIS — I2510 Atherosclerotic heart disease of native coronary artery without angina pectoris: Secondary | ICD-10-CM | POA: Diagnosis not present

## 2019-04-04 DIAGNOSIS — R58 Hemorrhage, not elsewhere classified: Secondary | ICD-10-CM | POA: Diagnosis not present

## 2019-04-05 DIAGNOSIS — Z03818 Encounter for observation for suspected exposure to other biological agents ruled out: Secondary | ICD-10-CM | POA: Diagnosis not present

## 2019-04-12 DIAGNOSIS — Z03818 Encounter for observation for suspected exposure to other biological agents ruled out: Secondary | ICD-10-CM | POA: Diagnosis not present

## 2019-04-19 DIAGNOSIS — Z03818 Encounter for observation for suspected exposure to other biological agents ruled out: Secondary | ICD-10-CM | POA: Diagnosis not present

## 2019-04-24 DIAGNOSIS — E1142 Type 2 diabetes mellitus with diabetic polyneuropathy: Secondary | ICD-10-CM | POA: Diagnosis not present

## 2019-04-24 DIAGNOSIS — G20 Parkinson's disease: Secondary | ICD-10-CM | POA: Diagnosis not present

## 2019-04-24 DIAGNOSIS — I2510 Atherosclerotic heart disease of native coronary artery without angina pectoris: Secondary | ICD-10-CM | POA: Diagnosis not present

## 2019-04-24 DIAGNOSIS — I119 Hypertensive heart disease without heart failure: Secondary | ICD-10-CM | POA: Diagnosis not present

## 2019-04-26 DIAGNOSIS — J690 Pneumonitis due to inhalation of food and vomit: Secondary | ICD-10-CM | POA: Diagnosis not present

## 2019-04-26 DIAGNOSIS — I119 Hypertensive heart disease without heart failure: Secondary | ICD-10-CM | POA: Diagnosis not present

## 2019-04-26 DIAGNOSIS — R05 Cough: Secondary | ICD-10-CM | POA: Diagnosis not present

## 2019-04-26 DIAGNOSIS — E1142 Type 2 diabetes mellitus with diabetic polyneuropathy: Secondary | ICD-10-CM | POA: Diagnosis not present

## 2019-04-26 DIAGNOSIS — Z03818 Encounter for observation for suspected exposure to other biological agents ruled out: Secondary | ICD-10-CM | POA: Diagnosis not present

## 2019-04-26 DIAGNOSIS — I2510 Atherosclerotic heart disease of native coronary artery without angina pectoris: Secondary | ICD-10-CM | POA: Diagnosis not present

## 2019-04-26 DIAGNOSIS — D649 Anemia, unspecified: Secondary | ICD-10-CM | POA: Diagnosis not present

## 2019-04-26 DIAGNOSIS — I69354 Hemiplegia and hemiparesis following cerebral infarction affecting left non-dominant side: Secondary | ICD-10-CM | POA: Diagnosis not present

## 2019-04-26 DIAGNOSIS — Z79899 Other long term (current) drug therapy: Secondary | ICD-10-CM | POA: Diagnosis not present

## 2019-04-27 DIAGNOSIS — E878 Other disorders of electrolyte and fluid balance, not elsewhere classified: Secondary | ICD-10-CM | POA: Diagnosis not present

## 2019-04-27 DIAGNOSIS — D649 Anemia, unspecified: Secondary | ICD-10-CM | POA: Diagnosis not present

## 2019-04-27 DIAGNOSIS — J690 Pneumonitis due to inhalation of food and vomit: Secondary | ICD-10-CM | POA: Diagnosis not present

## 2019-04-27 DIAGNOSIS — E870 Hyperosmolality and hypernatremia: Secondary | ICD-10-CM | POA: Diagnosis not present

## 2019-04-28 DIAGNOSIS — R627 Adult failure to thrive: Secondary | ICD-10-CM | POA: Diagnosis not present

## 2019-04-28 DIAGNOSIS — G20 Parkinson's disease: Secondary | ICD-10-CM | POA: Diagnosis not present

## 2019-04-28 DIAGNOSIS — J690 Pneumonitis due to inhalation of food and vomit: Secondary | ICD-10-CM | POA: Diagnosis not present

## 2019-04-28 DIAGNOSIS — E860 Dehydration: Secondary | ICD-10-CM | POA: Diagnosis not present

## 2019-04-30 DIAGNOSIS — R1312 Dysphagia, oropharyngeal phase: Secondary | ICD-10-CM | POA: Diagnosis not present

## 2019-04-30 DIAGNOSIS — E785 Hyperlipidemia, unspecified: Secondary | ICD-10-CM | POA: Diagnosis not present

## 2019-04-30 DIAGNOSIS — R627 Adult failure to thrive: Secondary | ICD-10-CM | POA: Diagnosis not present

## 2019-04-30 DIAGNOSIS — G20 Parkinson's disease: Secondary | ICD-10-CM | POA: Diagnosis not present

## 2019-04-30 DIAGNOSIS — I69354 Hemiplegia and hemiparesis following cerebral infarction affecting left non-dominant side: Secondary | ICD-10-CM | POA: Diagnosis not present

## 2019-04-30 DIAGNOSIS — J9601 Acute respiratory failure with hypoxia: Secondary | ICD-10-CM | POA: Diagnosis not present

## 2019-04-30 DIAGNOSIS — L89152 Pressure ulcer of sacral region, stage 2: Secondary | ICD-10-CM | POA: Diagnosis not present

## 2019-04-30 DIAGNOSIS — E119 Type 2 diabetes mellitus without complications: Secondary | ICD-10-CM | POA: Diagnosis not present

## 2019-04-30 DIAGNOSIS — M6281 Muscle weakness (generalized): Secondary | ICD-10-CM | POA: Diagnosis not present

## 2019-04-30 DIAGNOSIS — J690 Pneumonitis due to inhalation of food and vomit: Secondary | ICD-10-CM | POA: Diagnosis not present

## 2019-04-30 DIAGNOSIS — I6930 Unspecified sequelae of cerebral infarction: Secondary | ICD-10-CM | POA: Diagnosis not present

## 2019-04-30 DIAGNOSIS — I2510 Atherosclerotic heart disease of native coronary artery without angina pectoris: Secondary | ICD-10-CM | POA: Diagnosis not present

## 2019-04-30 DIAGNOSIS — F419 Anxiety disorder, unspecified: Secondary | ICD-10-CM | POA: Diagnosis not present

## 2019-04-30 DIAGNOSIS — M25642 Stiffness of left hand, not elsewhere classified: Secondary | ICD-10-CM | POA: Diagnosis not present

## 2019-04-30 DIAGNOSIS — E878 Other disorders of electrolyte and fluid balance, not elsewhere classified: Secondary | ICD-10-CM | POA: Diagnosis not present

## 2019-04-30 DIAGNOSIS — D649 Anemia, unspecified: Secondary | ICD-10-CM | POA: Diagnosis not present

## 2019-04-30 DIAGNOSIS — R278 Other lack of coordination: Secondary | ICD-10-CM | POA: Diagnosis not present

## 2019-04-30 DIAGNOSIS — F329 Major depressive disorder, single episode, unspecified: Secondary | ICD-10-CM | POA: Diagnosis not present

## 2019-04-30 DIAGNOSIS — K219 Gastro-esophageal reflux disease without esophagitis: Secondary | ICD-10-CM | POA: Diagnosis not present

## 2019-04-30 DIAGNOSIS — R42 Dizziness and giddiness: Secondary | ICD-10-CM | POA: Diagnosis not present

## 2019-04-30 DIAGNOSIS — Z79899 Other long term (current) drug therapy: Secondary | ICD-10-CM | POA: Diagnosis not present

## 2019-04-30 DIAGNOSIS — I10 Essential (primary) hypertension: Secondary | ICD-10-CM | POA: Diagnosis not present

## 2019-04-30 DIAGNOSIS — G894 Chronic pain syndrome: Secondary | ICD-10-CM | POA: Diagnosis not present

## 2019-05-03 DIAGNOSIS — Z03818 Encounter for observation for suspected exposure to other biological agents ruled out: Secondary | ICD-10-CM | POA: Diagnosis not present

## 2019-05-13 DEATH — deceased

## 2019-06-27 IMAGING — DX PORTABLE CHEST - 1 VIEW
1 series · 1 of 1 positions shown · non-contrast
Comparison: Chest x-ray 07/13/2018.

CLINICAL DATA: 72-year-old female with history of shortness of
breath.

EXAM:
PORTABLE CHEST 1 VIEW

[chest]
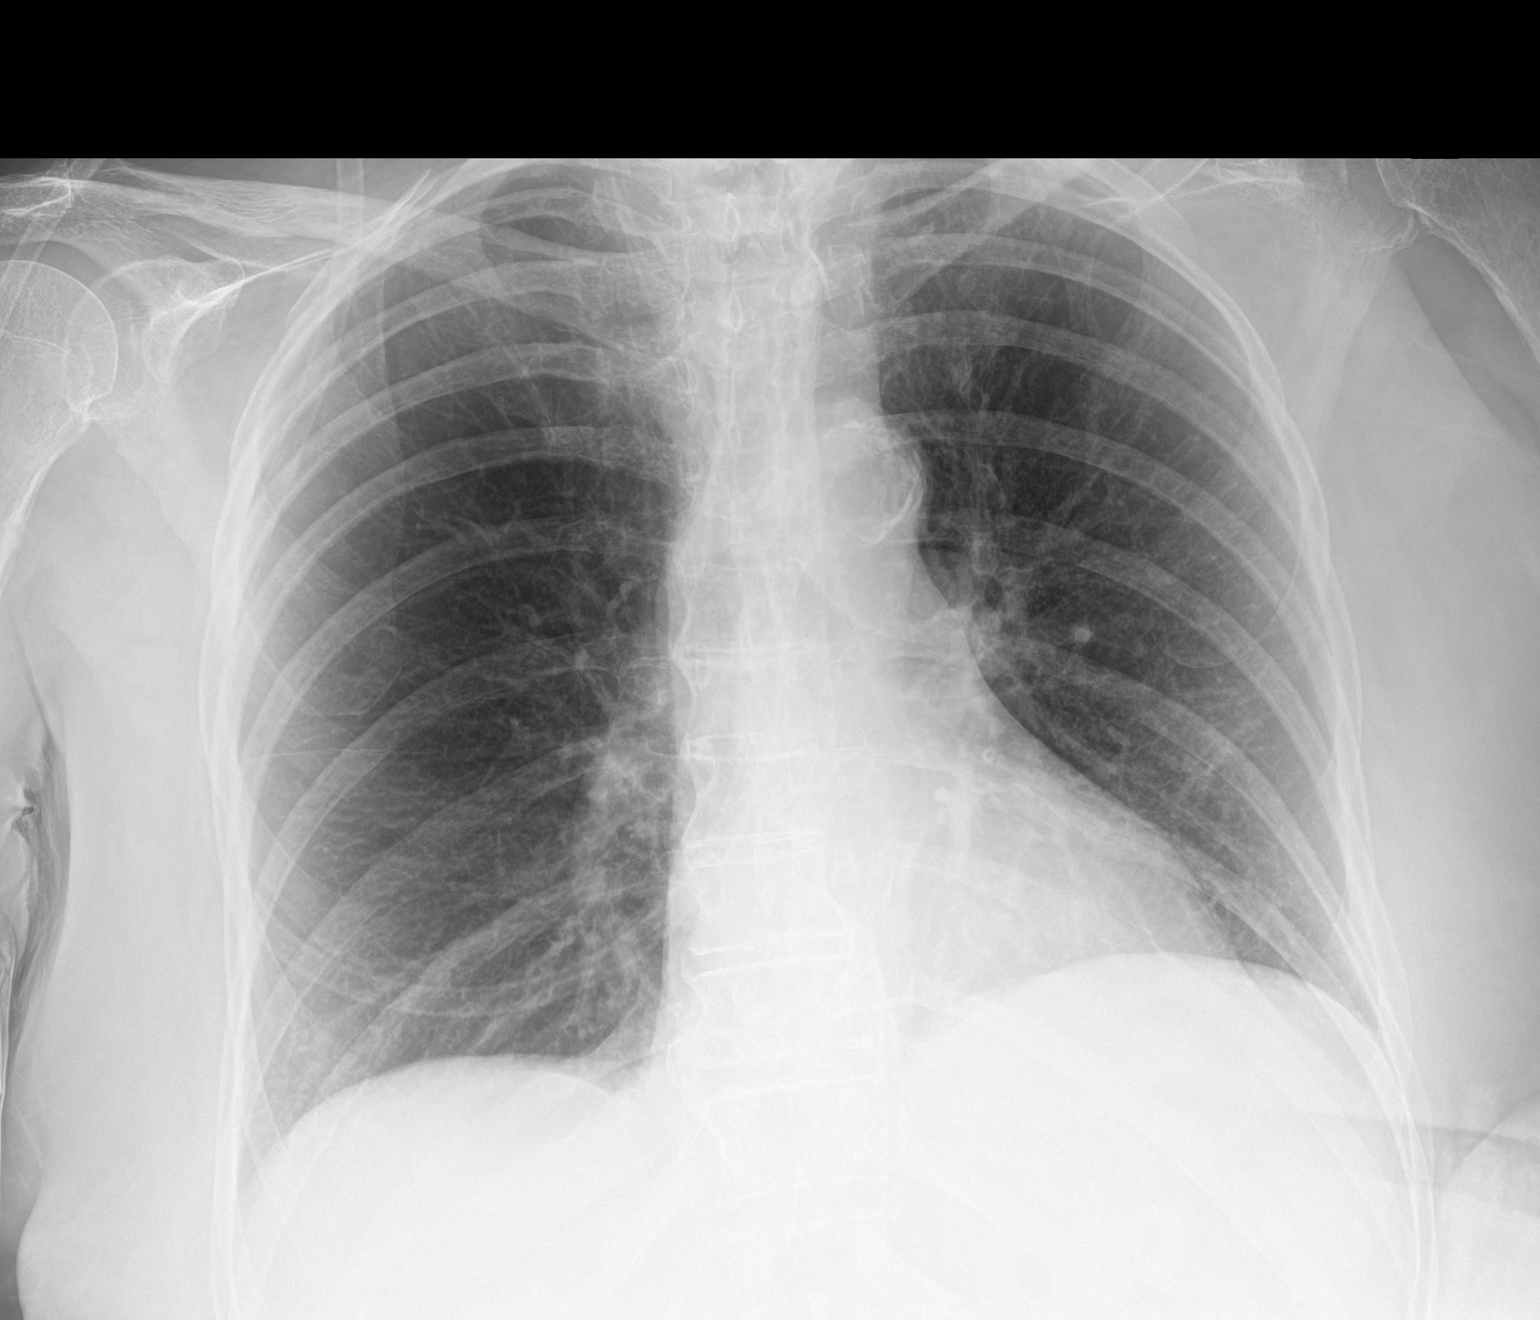

[1 of 1 positions shown; findings below may reference images not displayed]

FINDINGS: Lung volumes are low. No consolidative airspace disease. No pleural
effusions. No pneumothorax. No pulmonary nodule or mass noted.
Pulmonary vasculature and the cardiomediastinal silhouette are
within normal limits. Aortic atherosclerosis.
IMPRESSION: 1. Low lung volumes without radiographic evidence of acute
cardiopulmonary disease.
2. Aortic atherosclerosis.
